# Patient Record
Sex: Male | Born: 1940
Health system: Southern US, Community
[De-identification: ages and names within clinical notes are randomized; demographics above are authoritative.]

## PROBLEM LIST (undated history)

## (undated) DIAGNOSIS — E039 Hypothyroidism, unspecified: Secondary | ICD-10-CM

## (undated) DIAGNOSIS — C801 Malignant (primary) neoplasm, unspecified: Secondary | ICD-10-CM

## (undated) DIAGNOSIS — G373 Acute transverse myelitis in demyelinating disease of central nervous system: Secondary | ICD-10-CM

## (undated) DIAGNOSIS — Z86010 Personal history of colonic polyps: Secondary | ICD-10-CM

## (undated) DIAGNOSIS — Z973 Presence of spectacles and contact lenses: Secondary | ICD-10-CM

## (undated) DIAGNOSIS — M2550 Pain in unspecified joint: Secondary | ICD-10-CM

## (undated) DIAGNOSIS — E785 Hyperlipidemia, unspecified: Secondary | ICD-10-CM

## (undated) DIAGNOSIS — M1990 Unspecified osteoarthritis, unspecified site: Secondary | ICD-10-CM

## (undated) HISTORY — DX: Hyperlipidemia, unspecified: E78.5

## (undated) HISTORY — DX: Presence of spectacles and contact lenses: Z97.3

## (undated) HISTORY — DX: Unspecified osteoarthritis, unspecified site: M19.90

## (undated) HISTORY — DX: Pain in unspecified joint: M25.50

## (undated) HISTORY — DX: Acute transverse myelitis in demyelinating disease of central nervous system: G37.3

## (undated) HISTORY — DX: Hypothyroidism, unspecified: E03.9

## (undated) HISTORY — DX: Personal history of colonic polyps: Z86.010

## (undated) HISTORY — PX: COLONOSCOPY: SHX174

## (undated) HISTORY — DX: Malignant (primary) neoplasm, unspecified: C80.1

## (undated) HISTORY — PX: MELANOMA EXCISION: SHX5266

---

## 2009-03-08 ENCOUNTER — Encounter: Admission: RE | Admit: 2009-03-08 | Discharge: 2009-03-08 | Payer: Self-pay | Admitting: Endocrinology

## 2010-12-13 HISTORY — PX: HERNIA REPAIR: SHX51

## 2011-03-11 ENCOUNTER — Other Ambulatory Visit: Payer: Self-pay | Admitting: Internal Medicine

## 2011-03-11 DIAGNOSIS — Z136 Encounter for screening for cardiovascular disorders: Secondary | ICD-10-CM

## 2011-03-15 ENCOUNTER — Ambulatory Visit
Admission: RE | Admit: 2011-03-15 | Discharge: 2011-03-15 | Disposition: A | Discharge status: Home / Self Care | Payer: BC Managed Care – PPO | Source: Ambulatory Visit | Attending: Internal Medicine | Admitting: Internal Medicine

## 2011-03-15 DIAGNOSIS — Z136 Encounter for screening for cardiovascular disorders: Secondary | ICD-10-CM

## 2011-04-19 ENCOUNTER — Other Ambulatory Visit (HOSPITAL_BASED_OUTPATIENT_CLINIC_OR_DEPARTMENT_OTHER): Payer: Self-pay | Admitting: Physician Assistant

## 2011-04-19 DIAGNOSIS — IMO0002 Reserved for concepts with insufficient information to code with codable children: Secondary | ICD-10-CM

## 2011-04-20 ENCOUNTER — Ambulatory Visit (HOSPITAL_BASED_OUTPATIENT_CLINIC_OR_DEPARTMENT_OTHER)
Admission: RE | Admit: 2011-04-20 | Discharge: 2011-04-20 | Disposition: A | Discharge status: Home / Self Care | Payer: BC Managed Care – PPO | Source: Ambulatory Visit | Attending: Physician Assistant | Admitting: Physician Assistant

## 2011-04-20 DIAGNOSIS — R109 Unspecified abdominal pain: Secondary | ICD-10-CM | POA: Insufficient documentation

## 2011-04-20 DIAGNOSIS — R1903 Right lower quadrant abdominal swelling, mass and lump: Secondary | ICD-10-CM

## 2011-06-14 ENCOUNTER — Encounter (INDEPENDENT_AMBULATORY_CARE_PROVIDER_SITE_OTHER): Payer: Self-pay | Admitting: Surgery

## 2011-06-14 ENCOUNTER — Ambulatory Visit (INDEPENDENT_AMBULATORY_CARE_PROVIDER_SITE_OTHER): Payer: BC Managed Care – PPO | Admitting: Surgery

## 2011-06-14 VITALS — BP 128/82 | HR 60 | Ht 67.0 in | Wt 150.2 lb

## 2011-06-14 DIAGNOSIS — K4020 Bilateral inguinal hernia, without obstruction or gangrene, not specified as recurrent: Secondary | ICD-10-CM

## 2011-06-14 NOTE — Progress Notes (Signed)
Dr.Gerkin  and asked me to see him because he has bilateral inguinal hernias. He is felt to be a good candidate for laparoscopic bilateral inguinal hernia repair with mesh. I have seen him and examined him and agreed. The procedure risks and after care have been discussed with him.

## 2011-06-14 NOTE — Progress Notes (Signed)
History: Patient is a 70 year old white male referred for evaluation of bilateral inguinal hernia. Patient first noted hernia approximately 2 months ago. Hernia on the right side has increased considerably in size. Patient has had no prior hernia surgery. He's had no prior abdominal surgery. He denies any signs or symptoms of intestinal obstruction.  Review of systems: No obstructive symptoms. Mild occasional discomfort consistent with "stinging". No prior hernia repairs.  Past Medical History  Diagnosis Date  . Hyperlipemia   . Hypothyroid   . Cancer     melanoma  . Wears glasses   . Joint pain     minor  . Hemorrhoids     occasional   No current outpatient prescriptions on file prior to visit.   No Known Allergies  Exam: HEENT shows him to be normocephalic. Sclerae are clear. Pupils equal and reactive. Dentition fair. Mucous membranes moist. Neck is soft and nontender without mass. No thyroid nodules. No lymphadenopathy. Chest is clear to auscultation without rales rhonchi or wheezes Cardiac exam shows regular rate and rhythm without murmur Extremities are nontender without edema Abdomen is soft nontender without distention. No sign of umbilical hernia. Genitourinary exam shows normal penis and testicles without mass or lesion. There are obvious bilateral inguinal hernias greater on the right than on the left. Both are reducible. Both augment with Valsalva and cough. Neurologic exam is intact. No focal deficits. No sign of tumor.  Impression: Bilateral inguinal herniae, reducible  Plan: Patient and I reviewed the anatomy with written literature. We discussed the options for repair. We discussed the use of mesh in a tension-free repair either by open technique or by laparoscopic technique. Given that the patient has not had prior surgery, and that he has bilateral inguinal hernias, I think he is an excellent candidate for laparoscopic repair.  I have asked my partner, Dr.  Abbey Chatters, to evaluate the patient today. He feels that the patient is a good candidate for laparoscopic surgery and he will be happy to perform that procedure in the near future.  I have explained the procedure, risks, and aftercare of inguinal hernia repair.  Risks include but are not limited to bleeding, infection, wound problems, anesthesia, recurrence, bladder or intestine injury, urinary retention, testicular dysfunction, chronic pain, mesh problems.  He seems to understand and agrees to proceed

## 2011-07-07 ENCOUNTER — Other Ambulatory Visit (INDEPENDENT_AMBULATORY_CARE_PROVIDER_SITE_OTHER): Payer: Self-pay | Admitting: General Surgery

## 2011-07-07 ENCOUNTER — Encounter (HOSPITAL_COMMUNITY)
Admission: RE | Admit: 2011-07-07 | Discharge: 2011-07-07 | Disposition: A | Discharge status: Home / Self Care | Payer: BC Managed Care – PPO | Source: Ambulatory Visit | Attending: General Surgery | Admitting: General Surgery

## 2011-07-07 ENCOUNTER — Ambulatory Visit (HOSPITAL_COMMUNITY)
Admission: RE | Admit: 2011-07-07 | Discharge: 2011-07-07 | Disposition: A | Discharge status: Home / Self Care | Payer: BC Managed Care – PPO | Source: Ambulatory Visit | Attending: General Surgery | Admitting: General Surgery

## 2011-07-07 DIAGNOSIS — Z01818 Encounter for other preprocedural examination: Secondary | ICD-10-CM | POA: Insufficient documentation

## 2011-07-07 DIAGNOSIS — K4020 Bilateral inguinal hernia, without obstruction or gangrene, not specified as recurrent: Secondary | ICD-10-CM

## 2011-07-07 DIAGNOSIS — Z01810 Encounter for preprocedural cardiovascular examination: Secondary | ICD-10-CM | POA: Insufficient documentation

## 2011-07-07 DIAGNOSIS — Z01812 Encounter for preprocedural laboratory examination: Secondary | ICD-10-CM | POA: Insufficient documentation

## 2011-07-07 LAB — CBC
HCT: 41.5 % (ref 39.0–52.0)
MCH: 31.1 pg (ref 26.0–34.0)
MCHC: 34.5 g/dL (ref 30.0–36.0)
MCV: 90.2 fL (ref 78.0–100.0)
RDW: 12.9 % (ref 11.5–15.5)
WBC: 8.2 10*3/uL (ref 4.0–10.5)

## 2011-07-07 LAB — COMPREHENSIVE METABOLIC PANEL
ALT: 16 U/L (ref 0–53)
Alkaline Phosphatase: 97 U/L (ref 39–117)
CO2: 30 mEq/L (ref 19–32)
Calcium: 9.2 mg/dL (ref 8.4–10.5)
GFR calc Af Amer: >60 mL/min (ref >60)
GFR calc non Af Amer: >60 mL/min (ref >60)
Glucose, Bld: 108 mg/dL — ABNORMAL HIGH (ref 70–99)
Sodium: 140 mEq/L (ref 135–145)

## 2011-07-07 LAB — DIFFERENTIAL
Eosinophils Relative: 3 % (ref 0–5)
Lymphocytes Relative: 10 % — ABNORMAL LOW (ref 12–46)
Lymphs Abs: 0.9 10*3/uL (ref 0.7–4.0)
Monocytes Absolute: 0.8 10*3/uL (ref 0.1–1.0)
Monocytes Relative: 10 % (ref 3–12)

## 2011-07-08 ENCOUNTER — Observation Stay: Admission: RE | Admit: 2011-07-08 | Payer: Self-pay | Source: Ambulatory Visit | Admitting: General Surgery

## 2011-07-08 ENCOUNTER — Observation Stay (HOSPITAL_COMMUNITY)
Admission: RE | Admit: 2011-07-08 | Discharge: 2011-07-09 | Disposition: A | Discharge status: Home / Self Care | Payer: BC Managed Care – PPO | Source: Ambulatory Visit | Attending: General Surgery | Admitting: General Surgery

## 2011-07-08 DIAGNOSIS — K4020 Bilateral inguinal hernia, without obstruction or gangrene, not specified as recurrent: Principal | ICD-10-CM | POA: Insufficient documentation

## 2011-07-08 DIAGNOSIS — Z01812 Encounter for preprocedural laboratory examination: Secondary | ICD-10-CM | POA: Insufficient documentation

## 2011-07-08 DIAGNOSIS — Z01818 Encounter for other preprocedural examination: Secondary | ICD-10-CM | POA: Insufficient documentation

## 2011-07-08 DIAGNOSIS — Z01810 Encounter for preprocedural cardiovascular examination: Secondary | ICD-10-CM | POA: Insufficient documentation

## 2011-07-12 NOTE — Op Note (Signed)
NAMEKORE, MADLOCK NO.:  [id number]  MEDICAL RECORD NO.:  [id number]  LOCATION:  5152                         FACILITY:  [facility]  PHYSICIAN:  Adolph Pollack, M.D.DATE OF BIRTH:  Jul 10, 1941  DATE OF PROCEDURE:  07/08/2011 DATE OF DISCHARGE:                              OPERATIVE REPORT   PREOPERATIVE DIAGNOSIS:  Bilateral inguinal hernias.  POSTOPERATIVE DIAGNOSIS:  Bilateral inguinal hernias.  PROCEDURE:  Laparoscopic repair of bilateral inguinal hernias with mesh.  SURGEON:  Adolph Pollack, MD  ANESTHESIA:  General.  INDICATION:  This is a 70 year old man who was fairly active.  He has noted some distinct discomfort in the groin area and has a bulge on the right side.  It has been increasing in size and also has an obvious hernia on the left side.  He now presents for laparoscopic repair of bilateral inguinal hernias.  The procedure risks and aftercare were discussed with him preoperatively.  TECHNIQUE:  He was seen in the holding area, then voided.  He was brought to the operating room, placed supine on the operating table, and general anesthetic was administered.  The hair in the lower abdominal wall and groin was clipped and the area sterilely prepped and draped.  Marcaine solution was infiltrated in the subcutaneous tissues and the subumbilical region.  A small transverse subumbilical incision was made through the skin and subcutaneous tissue.  The right anterior rectus sheath was identified with blunt dissection.  A small incision was made in the right anterior rectus sheath.  The underlying right rectus muscle was swept laterally, exposing the posterior rectus sheath.  A balloon dissection device was then placed in the extraperitoneal space and under laparoscopic vision, balloon dissection was performed of the extraperitoneal space in the lower midline.  The balloon was then removed and CO2 gas was insufflated, creating a working area.   Under direct vision, I placed two 5-mm trocars in the lower midline.  Using blunt dissection, the symphysis pubis was identified and Cooper ligament was identified using blunt dissection bilaterally.  Beginning on the right side, I dissected fibrofatty tissue free from the anterior and lateral abdominal walls back to the level of the umbilicus.  The direct space appeared solid.  There appeared to be indirect hernia sac present.  I then went to the left side and began my dissection here.  He appeared to have actually direct hernia on this side and I reduced extraperitoneal fat from the direct hernia defect.  I then dissected the fibrofatty tissue free from the anterior and lateral abdominal walls back to the level of the umbilicus.  He did sustain a pneumoperitoneum, but I saw no obvious defect in the peritoneum.  A Veress needle was placed through the subumbilical incision with evacuation of some of the pneumoperitoneum.  On the left side, I isolated spermatic cord and created a window around it and no indirect defect was noted.  I then went back to the right side and dissected the hernia sac free from the spermatic cord, exposing a patulous internal ring consistent with an indirect hernia.  I dissected the hernia sac back to about the level of the umbilicus.  I  then brought 2 pieces of Parietex TECR 15-cm x 15-cm mesh into the field.  Approximately 2 cm was cut off each piece of mesh.  A partial longitudinal slit was then cut into each piece of mesh.  I placed the first piece of mesh into the right extraperitoneal space and positioned it to the 2 tails that were wrapped around the spermatic cord.  We then anchored to [facility] ligament the anterior and lateral abdominal walls with absorbable tacks.  This provided for good coverage with adequate overlap of the direct, indirect, and femoral spaces.  I placed a second piece of mesh in the left extraperitoneal space, positioned it so that 2  tails wrapped around the spermatic cord.  I then anchored it to the Vermilion ligament as well as the anterior and lateral abdominal walls with absorbable tacks.  This provided for good coverage with good overlap of the direct, indirect, and femoral spaces.  There was some bleeding where one of the absorbable tacks was placed and I put the tack distal and proximal to this and controlled it.  I waited 5-10 minutes, inspected this area again, it was hemostatic.  Following this, I made sure the mesh was in adequate position and I released the CO2 gas and watched the extraperitoneal contents, approximated the mesh.  The Veress needle and the trocars were removed.  The right anterior rectus sheath defect was then closed with interrupted 0 Vicryl sutures.  The skin incisions were closed with 4-0 Monocryl subcuticular stitches.  Steri-Strips and sterile dressings were applied.  He tolerated the procedure well without any apparent complications and was taken to recovery room in satisfactory condition.     Adolph Pollack, M.D.     Kari Baars  D:  07/08/2011  T:  07/09/2011  Job:  [id number]  Electronically Signed by Avel Peace M.D. on 07/12/2011 09:37:26 AM

## 2011-08-02 ENCOUNTER — Encounter (INDEPENDENT_AMBULATORY_CARE_PROVIDER_SITE_OTHER): Payer: Self-pay | Admitting: General Surgery

## 2011-08-02 ENCOUNTER — Ambulatory Visit (INDEPENDENT_AMBULATORY_CARE_PROVIDER_SITE_OTHER): Payer: BC Managed Care – PPO | Admitting: General Surgery

## 2011-08-02 DIAGNOSIS — K4020 Bilateral inguinal hernia, without obstruction or gangrene, not specified as recurrent: Secondary | ICD-10-CM

## 2011-08-02 NOTE — Patient Instructions (Signed)
May chip and putt.  May resume usual activities on September 7.

## 2011-08-02 NOTE — Progress Notes (Signed)
Operation: Laparoscopic bilateral inguinal hernia repair  Date: July 08, 2011  Pathology: na  HPI:  He is here for his first postoperative visit. He has minimal soreness. He is using the bathroom well.   Physical Exam: Abdomen-incisions are well-healed  GU-mild right inguinal swelling consistent with seroma; left inguinal floor solid   Assessment:  Doing well following laparoscopic bilateral inguinal hernia repair.  Plan:  Continue light activities. Resume normal activities September 7. I discussed the possibility of having chronic intermittent pain. Return visit p.r.n.

## 2011-08-05 NOTE — Discharge Summary (Signed)
  NAMEKOBEY, SIDES NO.:  [id number]  MEDICAL RECORD NO.:  [id number]  LOCATION:  5152                         FACILITY:  [facility]  PHYSICIAN:  Adolph Pollack, M.D.DATE OF BIRTH:  [birth year]  DATE OF ADMISSION:  07/08/2011 DATE OF DISCHARGE:  07/09/2011                              DISCHARGE SUMMARY   FINAL DIAGNOSIS:  Bilateral inguinal hernia.  SECONDARY DIAGNOSIS:  Postoperative urinary retention.  PROCEDURE:  Laparoscopic repair, bilateral inguinal hernias with mesh July 08, 2011.  REASON FOR ADMISSION:  This is a 70 year old very active gentleman who has bilateral inguinal hernias.  Right side greater than left side.  He presented for the above procedure.  HOSPITAL COURSE:  He underwent laparoscopic repair, bilateral inguinal hernias with mesh.  Initially, we planned to send home as an outpatient but he had difficulty with urinary attention.  End up having to have a Foley catheter placed and thus was kept overnight.  He was fairly comfortable in the morning, able to ambulate.  He had been started on Flomax the night before.  After discussion with him it was decided the best thing to do would be to send him home with a Foley catheter and give him instructions how to care for it and how to remove in 2-3 days, and he and his wife were in agreement with this.  DISPOSITION:  Discharged to home with Foley catheter placed a leg bag. He will remove this in approximately 2-3 days.  He was given other discharge instructions with respect activity restrictions and dietary restrictions as well as pain medicine.  He was told to continue his aspirin, Synthroid and Lipitor.  He will follow up in the office in approximately 3-4 weeks.  He was in satisfactory condition upon discharge.     Adolph Pollack, M.D.     Kari Baars  D:  08/03/2011  T:  08/03/2011  Job:  [id number]  Electronically Signed by Avel Peace M.D. on 08/05/2011 09:52:30 AM

## 2014-04-11 ENCOUNTER — Other Ambulatory Visit: Payer: Self-pay | Admitting: Internal Medicine

## 2014-04-11 DIAGNOSIS — Z139 Encounter for screening, unspecified: Secondary | ICD-10-CM

## 2014-04-16 ENCOUNTER — Encounter (INDEPENDENT_AMBULATORY_CARE_PROVIDER_SITE_OTHER): Payer: Self-pay

## 2014-04-16 ENCOUNTER — Ambulatory Visit
Admission: RE | Admit: 2014-04-16 | Discharge: 2014-04-16 | Disposition: A | Discharge status: Home / Self Care | Payer: Commercial Managed Care - PPO | Source: Ambulatory Visit | Attending: Internal Medicine | Admitting: Internal Medicine

## 2014-04-16 DIAGNOSIS — Z139 Encounter for screening, unspecified: Secondary | ICD-10-CM

## 2014-05-14 ENCOUNTER — Ambulatory Visit (INDEPENDENT_AMBULATORY_CARE_PROVIDER_SITE_OTHER): Payer: Commercial Managed Care - PPO | Admitting: Cardiovascular Disease

## 2014-05-14 ENCOUNTER — Encounter: Payer: Self-pay | Admitting: Cardiovascular Disease

## 2014-05-14 VITALS — BP 130/88 | HR 59 | Ht 67.5 in | Wt 168.8 lb

## 2014-05-14 DIAGNOSIS — E785 Hyperlipidemia, unspecified: Secondary | ICD-10-CM

## 2014-05-14 NOTE — Patient Instructions (Signed)
Dr [name] wants you to follow-up in 1 year. You will receive a reminder letter in the mail one months in advance. If you don't receive a letter, please call our office to schedule the follow-up appointment.

## 2014-05-14 NOTE — Progress Notes (Signed)
     05/14/2014 Alec Snyder   1941-03-23  [id number]  Primary Physician Horatio Pel, MD Primary Cardiologist: Lorretta Harp MD Renae Gloss   HPI:  Mr. Alec Snyder is a delightful 73 year old married Caucasian male father of 2 children, and father of 4 grandchildren referred through the courtesy of Dr. Deland Pretty for cardiovascular evaluation. 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 His cardiac risk factor profile is only remarkable for mild medically treated hyperlipidemia. He did does not smoke nor is he diabetic. There is no family history. He has never had a heart or stroke. He denies chest pain or shortness of breath. He does exercise fairly frequently on the treadmill.   Current Outpatient Prescriptions  Medication Sig Dispense Refill  . aspirin 81 MG tablet Take 81 mg by mouth daily.        Marland Kitchen atorvastatin (LIPITOR) 10 MG tablet Take 10 mg by mouth daily.        Marland Kitchen levothyroxine (SYNTHROID, LEVOTHROID) 125 MCG tablet Take 125 mcg by mouth daily.         No current facility-administered medications for this visit.    No Known Allergies  History   Social History  . Marital Status: Married    Spouse Name: N/A    Number of Children: N/A  . Years of Education: N/A   Occupational History  . Not on file.   Social History Main Topics  . Smoking status: Former Smoker -- 1.50 packs/day for 3 years    Types: Cigarettes    Quit date: 05/14/1974  . Smokeless tobacco: Never Used  . Alcohol Use: 1.0 oz/week    2 drink(s) per week  . Drug Use: No  . Sexual Activity: Not on file   Other Topics Concern  . Not on file   Social History Narrative  . No narrative on file     Review of Systems: General: negative for chills, fever, night sweats or weight changes.  Cardiovascular: negative for chest pain, dyspnea on exertion, edema, orthopnea, palpitations, paroxysmal nocturnal dyspnea or shortness of  breath Dermatological: negative for rash Respiratory: negative for cough or wheezing Urologic: negative for hematuria Abdominal: negative for nausea, vomiting, diarrhea, bright red blood per rectum, melena, or hematemesis Neurologic: negative for visual changes, syncope, or dizziness All other systems reviewed and are otherwise negative except as noted above.    Blood pressure 130/88, pulse 59, height 5' 7.5" (1.715 m), weight 168 lb 12.8 oz (76.567 kg).  General appearance: alert and no distress Neck: no adenopathy, no carotid bruit, no JVD, supple, symmetrical, trachea midline and thyroid not enlarged, symmetric, no tenderness/mass/nodules Lungs: clear to auscultation bilaterally Heart: regular rate and rhythm, S1, S2 normal, no murmur, click, rub or gallop Extremities: extremities normal, atraumatic, no cyanosis or edema and plus pedal pulses bilaterally  EKG sinus bradycardia at 59 without ST or T wave changes  ASSESSMENT AND PLAN:   No problem-specific assessment & plan notes found for this encounter.      Lorretta Harp MD FACP,FACC,FAHA, [facility] 05/14/2014 4:20 PM

## 2014-12-13 HISTORY — PX: BLEPHAROPLASTY: SUR158

## 2016-02-20 ENCOUNTER — Ambulatory Visit (HOSPITAL_BASED_OUTPATIENT_CLINIC_OR_DEPARTMENT_OTHER)
Admission: RE | Admit: 2016-02-20 | Discharge: 2016-02-20 | Disposition: A | Discharge status: Home / Self Care | Payer: Commercial Managed Care - PPO | Source: Ambulatory Visit | Attending: General Practice | Admitting: General Practice

## 2016-02-20 ENCOUNTER — Other Ambulatory Visit (HOSPITAL_BASED_OUTPATIENT_CLINIC_OR_DEPARTMENT_OTHER): Payer: Self-pay | Admitting: General Practice

## 2016-02-20 DIAGNOSIS — R05 Cough: Secondary | ICD-10-CM

## 2016-02-20 DIAGNOSIS — R053 Chronic cough: Secondary | ICD-10-CM

## 2016-05-28 ENCOUNTER — Other Ambulatory Visit: Payer: Self-pay | Admitting: Internal Medicine

## 2016-05-28 DIAGNOSIS — M5412 Radiculopathy, cervical region: Secondary | ICD-10-CM

## 2016-06-01 ENCOUNTER — Other Ambulatory Visit: Payer: Self-pay | Admitting: Internal Medicine

## 2016-06-01 ENCOUNTER — Ambulatory Visit
Admission: RE | Admit: 2016-06-01 | Discharge: 2016-06-01 | Disposition: A | Discharge status: Home / Self Care | Payer: Commercial Managed Care - PPO | Source: Ambulatory Visit | Attending: Internal Medicine | Admitting: Internal Medicine

## 2016-06-01 DIAGNOSIS — M5412 Radiculopathy, cervical region: Secondary | ICD-10-CM

## 2016-06-01 MED ORDER — GADOBENATE DIMEGLUMINE 529 MG/ML IV SOLN
16.0000 mL | Freq: Once | INTRAVENOUS | Status: AC | PRN
  Administered 2016-06-01: 16 mL via INTRAVENOUS

## 2016-06-02 ENCOUNTER — Other Ambulatory Visit: Payer: Self-pay | Admitting: Internal Medicine

## 2016-06-02 ENCOUNTER — Ambulatory Visit (INDEPENDENT_AMBULATORY_CARE_PROVIDER_SITE_OTHER): Payer: Commercial Managed Care - PPO | Admitting: Diagnostic Neuroimaging

## 2016-06-02 ENCOUNTER — Encounter: Payer: Self-pay | Admitting: *Deleted

## 2016-06-02 VITALS — BP 183/94 | HR 66 | Ht 67.0 in | Wt 170.8 lb

## 2016-06-02 DIAGNOSIS — G373 Acute transverse myelitis in demyelinating disease of central nervous system: Secondary | ICD-10-CM | POA: Diagnosis not present

## 2016-06-02 DIAGNOSIS — M546 Pain in thoracic spine: Secondary | ICD-10-CM

## 2016-06-02 MED ORDER — PREDNISONE 10 MG PO TABS
ORAL_TABLET | ORAL | Status: DC
Start: 2016-06-02 — End: 2016-06-21

## 2016-06-02 NOTE — Patient Instructions (Signed)
Thank you for coming to see [place] at [facility]. I hope we have been able to provide you high quality care today.  You may receive a patient satisfaction survey over the next few weeks. We would appreciate your feedback and comments so that we may continue to improve ourselves and the health of our patients.  - start prednisone 69m, then reduce 11meach day.  - I will check MRI brain    ~~~~~~~~~~~~~~~~~~~~~~~~~~~~~~~~~~~~~~~~~~~~~~~~~~~~~~~~~~~~~~~~~  DR. Demarrion Meiklejohn'S GUIDE TO HAPPY AND HEALTHY LIVING These are some of my general health and wellness recommendations. Some of them may apply to you better than others. Please use common sense as you try these suggestions and feel free to ask me any questions.   ACTIVITY/FITNESS Mental, social, emotional and physical stimulation are very important for brain and body health. Try learning a new activity (arts, music, language, sports, games).  Keep moving your body to the best of your abilities. You can do this at home, inside or outside, the park, community center, gym or anywhere you like. Consider a physical therapist or personal trainer to get started. Consider the app Sworkit. Fitness trackers such as smart-watches, smart-phones or Fitbits can help as well.   NUTRITION Eat more plants: colorful vegetables, nuts, seeds and berries.  Eat less sugar, salt, preservatives and processed foods.  Avoid toxins such as cigarettes and alcohol.  Drink water when you are thirsty. Warm water with a slice of lemon is an excellent morning drink to start the day.  Consider these websites for more information The Nutrition Source (hthttps://www.henry-hernandez.biz/Precision Nutrition (wwWindowBlog.ch  RELAXATION Consider practicing mindfulness meditation or other relaxation techniques such as deep breathing, prayer, yoga, tai chi, massage. See website mindful.org or the apps Headspace or  Calm to help get started.   SLEEP Try to get at least 7-8+ hours sleep per day. Regular exercise and reduced caffeine will help you sleep better. Practice good sleep hygeine techniques. See website sleep.org for more information.   PLANNING Prepare estate planning, living will, healthcare POA documents. Sometimes this is best planned with the help of an attorney. Theconversationproject.org and agingwithdignity.org are excellent resources.

## 2016-06-02 NOTE — Progress Notes (Addendum)
[facility]  PATIENT: Alec Snyder DOB: Dec 10, 1941  REFERRING CLINICIAN: [name] HISTORY FROM: patient  REASON FOR VISIT: new consult    HISTORICAL  CHIEF COMPLAINT:  Chief Complaint  Patient presents with  . Pain    rm 7, New Pt ,"Abnl MRI C spine; pain/numbness/burning - L shoulder, chest, axilla > 1 month"    HISTORY OF PRESENT ILLNESS:   75 year old right-handed male here for evaluation of left upper extremity abnormal sensation, left axillary abnormal sensation, and abnormal MRI cervical spine.  March 2017, patient noted abnormal neck sensation, when looking to the left and taking a golf swing. Around April 2017 patient noticed new onset of hurting and burning sensation in his left armpit/axillary region and into his left chest. Patient initially thought he had rubbed onto poison ivy or possibly was developing shingles. Symptoms transitioned over the next few days into a numbness and tingling sensation. Patient also felt some heaviness in his left arm. Patient was still able to perform his day-to-day activities including playing golf.  Patient went to his work wellness clinic ([name]) for evaluation of of symptoms in May 2017, had cervical spine x-ray which showed advanced cervical spondylosis. Due to persistent symptoms patient followed up with PCP ([name]) in June 2017 for annual physical and then MRI of the cervical spine was ordered. On 06/01/2016, abnormal signal was noted within the spinal cord centrally at C6 and towards the left posterior lateral column extending inferiorly into the thoracic spinal cord. Follow-up postcontrast imaging demonstrated abnormal enhancement in the left posterior column. Patient then referred to me for urgent evaluation.  Today patient reports continued abnormal sensation in the left armpit, left chest, left posterior scapular region. Overall symptoms have been stable without worsening.   Patient denies any prodromal  infections, vaccinations, traumas or change in medications. No problems with right arm, bilateral legs, balance or walking. No headaches or vision changes.   REVIEW OF SYSTEMS: Full 14 system review of systems performed and negative with exception of: aching muscles.  ALLERGIES: No Known Allergies  HOME MEDICATIONS: Outpatient Prescriptions Prior to Visit  Medication Sig Dispense Refill  . aspirin 81 MG tablet Take 81 mg by mouth daily.      Marland Kitchen levothyroxine (SYNTHROID, LEVOTHROID) 125 MCG tablet Take 125 mcg by mouth daily.      Marland Kitchen atorvastatin (LIPITOR) 10 MG tablet Take 10 mg by mouth daily.       No facility-administered medications prior to visit.    PAST MEDICAL HISTORY: Past Medical History  Diagnosis Date  . Hyperlipemia   . Hypothyroid   . Cancer ([place])     melanoma  . Wears glasses   . Joint pain     minor  . Hemorrhoids     occasional  . Inguinal hernia     bilateral    PAST SURGICAL HISTORY: Past Surgical History  Procedure Laterality Date  . Melanoma excision    . Hernia repair  2012    BIH  . Blepharoplasty Bilateral 2016    FAMILY HISTORY: Family History  Problem Relation Age of Onset  . Kidney failure Father   . Diabetes Mother   . Arthritis Brother   . Arthritis Sister     SOCIAL HISTORY:  Social History   Social History  . Marital Status: Married    Spouse Name: [name]  . Number of Children: 2  . Years of Education: 16   Occupational History  .  retired, Airline pilot   Social History Main Topics  . Smoking status: Former Smoker -- 1.50 packs/day for 3 years    Types: Cigarettes    Quit date: 05/14/1974  . Smokeless tobacco: Never Used  . Alcohol Use: 1.2 oz/week    2 Standard drinks or equivalent per week     Comment: occas 2-4 beers  weekly  . Drug Use: No  . Sexual Activity: Not on file   Other Topics Concern  . Not on file   Social History Narrative   Lives with spouse   Caffeine use- coffee  3-4 cups daily      PHYSICAL EXAM  GENERAL EXAM/CONSTITUTIONAL: Vitals:  Filed Vitals:   06/02/16 1611  BP: 183/94  Pulse: 66  Height: 5\' 7"  (1.702 m)  Weight: 170 lb 12.8 oz (77.474 kg)     Body mass index is 26.74 kg/(m^2).  No exam data present  Patient is in no distress; well developed, nourished and groomed; neck is supple  CARDIOVASCULAR:  Examination of carotid arteries is normal; no carotid bruits  Regular rate and rhythm, no murmurs  Examination of peripheral vascular system by observation and palpation is normal  EYES:  Ophthalmoscopic exam of optic discs and posterior segments is normal; no papilledema or hemorrhages  MUSCULOSKELETAL:  Gait, strength, tone, movements noted in Neurologic exam below  NEUROLOGIC: MENTAL STATUS:  No flowsheet data found.  awake, alert, oriented to person, place and time  recent and remote memory intact  normal attention and concentration  language fluent, comprehension intact, naming intact,   fund of knowledge appropriate  CRANIAL NERVE:   2nd - no papilledema on fundoscopic exam  2nd, 3rd, 4th, 6th - pupils equal and reactive to light, visual fields full to confrontation, extraocular muscles intact, no nystagmus  5th - facial sensation symmetric  7th - facial strength symmetric  8th - hearing intact  9th - palate elevates symmetrically, uvula midline  11th - shoulder shrug symmetric  12th - tongue protrusion midline  MOTOR:   normal bulk and tone, full strength in the BUE, BLE  SENSORY:   normal and symmetric to light touch, pinprick, temperature, vibration; EXCEPT DECR PP IN LEFT AXILLA  COORDINATION:   finger-nose-finger, fine finger movements normal  REFLEXES:   deep tendon reflexes present and symmetric  GAIT/STATION:   narrow based gait; able to walk on toes, heels and tandem; romberg is negative    DIAGNOSTIC DATA (LABS, IMAGING, TESTING) - I reviewed patient records, labs, notes, testing  and imaging myself where available.  Lab Results  Component Value Date   WBC 8.2 07/07/2011   HGB 14.3 07/07/2011   HCT 41.5 07/07/2011   MCV 90.2 07/07/2011   PLT 243 07/07/2011      Component Value Date/Time   NA 140 07/07/2011 0956   K 4.5 07/07/2011 0956   CL 103 07/07/2011 0956   CO2 30 07/07/2011 0956   GLUCOSE 108* 07/07/2011 0956   BUN 11 07/07/2011 0956   CREATININE 0.96 07/07/2011 0956   CALCIUM 9.2 07/07/2011 0956   PROT 6.5 07/07/2011 0956   ALBUMIN 3.7 07/07/2011 0956   AST 19 07/07/2011 0956   ALT 16 07/07/2011 0956   ALKPHOS 97 07/07/2011 0956   BILITOT 0.5 07/07/2011 0956   GFRNONAA >60 07/07/2011 0956   GFRAA >60 07/07/2011 0956   No results found for: CHOL, HDL, LDLCALC, LDLDIRECT, TRIG, CHOLHDL No results found for: HGBA1C No results found for: VITAMINB12 No results found for: TSH  06/01/16 MRI cervical spine [I reviewed images myself and agree with interpretation. Other considerations would include spinal cord vascular malformation, such as dural AV fistula or other AVM. -VRP]  - Abnormal spinal cord beginning at C6 and extending into the upper thoracic spine. There is hyperintense signal in the central cord and in the posterior column on the left. The posterior column shows intense enhancement . Cord is not enlarged. - This may represent an area of healing transverse myelitis. Neoplasm appears unlikely given the multi segment extent of the abnormality and lack of cord enlargement. Cord infarct is a consideration. B12 deficiency typically is higher in the cervical cord and involves both posterior columns.     ASSESSMENT AND PLAN  75 y.o. year old male here with new onset burning, tingling, numbness sensation in left axillary region and left scapular region, with subjective muscle weakness and left upper extremity. Patient found to have unusual abnormal MRI cervical spine with longitudinally extensive spinal cord lesion centrally and within the left  posterior column region extending from C6 level down to visualized upper thoracic spinal cord region. Would favor inflammatory, autoimmune, vascular etiologies.   Ddx: spinal cord inflammatory, autoimmune, vascular, neoplastic etiologies  1. Transverse myelitis ([place])      PLAN: - I will order MRI brain with and without; we'll try to coordinate this with MRI thoracic spine which is already ordered - After additional MRI scans, we'll check additional lab testing and possible lumbar puncture - Start oral prednisone tapering course; considered IV Solu-Medrol but due to subacute timing of symptoms, patient concerns about side effects, mild neurologic exam symptoms, we decided to hold off on high-dose IV steroids  Orders Placed This Encounter  Procedures  . MR Brain W Wo Contrast    Meds ordered this encounter  Medications  . predniSONE (DELTASONE) 10 MG tablet    Sig: Take 60mg  on day 1. Reduce by 10mg  each subsequent day. (60, 50, 40, 30, 20, 10, stop)    Dispense:  21 tablet    Refill:  0   Return in about 1 month (around 07/02/2016).  I reviewed images, labs, notes, records myself. I summarized findings and reviewed with patient, for this high risk condition (possible transverse myelitis) requiring high complexity decision making.    Penni Bombard, MD [id number], [id number] PM Certified in Neurology, Neurophysiology and Neuroimaging  [facility] [address] [place], [place] [address] [phone]

## 2016-06-03 ENCOUNTER — Ambulatory Visit
Admission: RE | Admit: 2016-06-03 | Discharge: 2016-06-03 | Disposition: A | Discharge status: Home / Self Care | Payer: Commercial Managed Care - PPO | Source: Ambulatory Visit | Attending: Diagnostic Neuroimaging | Admitting: Diagnostic Neuroimaging

## 2016-06-03 ENCOUNTER — Ambulatory Visit
Admission: RE | Admit: 2016-06-03 | Discharge: 2016-06-03 | Disposition: A | Discharge status: Home / Self Care | Payer: Commercial Managed Care - PPO | Source: Ambulatory Visit | Attending: Internal Medicine | Admitting: Internal Medicine

## 2016-06-03 ENCOUNTER — Telehealth: Payer: Self-pay | Admitting: Diagnostic Neuroimaging

## 2016-06-03 DIAGNOSIS — G373 Acute transverse myelitis in demyelinating disease of central nervous system: Secondary | ICD-10-CM

## 2016-06-03 DIAGNOSIS — M546 Pain in thoracic spine: Secondary | ICD-10-CM

## 2016-06-03 MED ORDER — GADOBENATE DIMEGLUMINE 529 MG/ML IV SOLN
15.0000 mL | Freq: Once | INTRAVENOUS | Status: AC | PRN
  Administered 2016-06-03: 15 mL via INTRAVENOUS

## 2016-06-03 NOTE — Telephone Encounter (Signed)
[facility], they added the patients MRI Brain w/wo to a 6:30 apt right after his MRI Thoracic scheduled at 5:50 today. I called the patient to inform him.

## 2016-06-04 ENCOUNTER — Other Ambulatory Visit: Payer: Self-pay | Admitting: Diagnostic Neuroimaging

## 2016-06-04 ENCOUNTER — Other Ambulatory Visit (INDEPENDENT_AMBULATORY_CARE_PROVIDER_SITE_OTHER): Payer: Self-pay

## 2016-06-04 DIAGNOSIS — Z0289 Encounter for other administrative examinations: Secondary | ICD-10-CM

## 2016-06-21 ENCOUNTER — Encounter: Payer: Self-pay | Admitting: Diagnostic Neuroimaging

## 2016-06-21 ENCOUNTER — Ambulatory Visit (INDEPENDENT_AMBULATORY_CARE_PROVIDER_SITE_OTHER): Payer: Commercial Managed Care - PPO | Admitting: Diagnostic Neuroimaging

## 2016-06-21 VITALS — BP 138/86 | HR 68 | Wt 170.2 lb

## 2016-06-21 DIAGNOSIS — M542 Cervicalgia: Secondary | ICD-10-CM

## 2016-06-21 DIAGNOSIS — R200 Anesthesia of skin: Secondary | ICD-10-CM

## 2016-06-21 DIAGNOSIS — G373 Acute transverse myelitis in demyelinating disease of central nervous system: Secondary | ICD-10-CM

## 2016-06-21 MED ORDER — GABAPENTIN 300 MG PO CAPS: 300.0000 mg | ORAL_CAPSULE | Freq: Three times a day (TID) | ORAL | Status: DC

## 2016-06-21 NOTE — Patient Instructions (Signed)
-   I will check CT chest/abd/pelvis, additional lab testing and then lumbar puncture - trial of gabapentin 300mg  for symptoms management; take 1 tab at bedtime; may increase to twice a day or three times per day as tolerated

## 2016-06-21 NOTE — Progress Notes (Signed)
[facility]  PATIENT: Alec Snyder DOB: 02/20/1941  REFERRING CLINICIAN: [name] HISTORY FROM: patient  REASON FOR VISIT: follow up   HISTORICAL  CHIEF COMPLAINT:  Chief Complaint  Patient presents with  . Transverse myelitis    rm 7, early FU, "pain in neck > 3 mos-getting worse; cannot bend over; muscular type pain in chest; taking Aleve twice daily; numbness in upper chest the same, numbness in top left shoulder has decreased"  . Follow-up    early FU due to increasing pain in neck    HISTORY OF PRESENT ILLNESS:   UPDATE 06/21/16: Since last visit, overall having more neck pain. No change in sxs with prednisone pack. More chest stiffness. Balance, bowel, bladder function normal.  Left shoulder numbness has improved.  PRIOR HPI (06/02/16): 75 year old right-handed male here for evaluation of left upper extremity abnormal sensation, left axillary abnormal sensation, and abnormal MRI cervical spine. March 2017, patient noted abnormal neck sensation, when looking to the left and taking a golf swing. Around April 2017 patient noticed new onset of hurting and burning sensation in his left armpit/axillary region and into his left chest. Patient initially thought he had rubbed onto poison ivy or possibly was developing shingles. Symptoms transitioned over the next few days into a numbness and tingling sensation. Patient also felt some heaviness in his left arm. Patient was still able to perform his day-to-day activities including playing golf. Patient went to his work wellness clinic ([name]) for evaluation of of symptoms in May 2017, had cervical spine x-ray which showed advanced cervical spondylosis. Due to persistent symptoms patient followed up with PCP ([name]) in June 2017 for annual physical and then MRI of the cervical spine was ordered. On 06/01/2016, abnormal signal was noted within the spinal cord centrally at C6 and towards the left posterior lateral column  extending inferiorly into the thoracic spinal cord. Follow-up postcontrast imaging demonstrated abnormal enhancement in the left posterior column. Patient then referred to me for urgent evaluation. Today patient reports continued abnormal sensation in the left armpit, left chest, left posterior scapular region. Overall symptoms have been stable without worsening. Patient denies any prodromal infections, vaccinations, traumas or change in medications. No problems with right arm, bilateral legs, balance or walking. No headaches or vision changes.   REVIEW OF SYSTEMS: Full 14 system review of systems performed and negative with exception of: aching muscles.  ALLERGIES: No Known Allergies  HOME MEDICATIONS: Outpatient Prescriptions Prior to Visit  Medication Sig Dispense Refill  . aspirin 81 MG tablet Take 81 mg by mouth daily.      Marland Kitchen levothyroxine (SYNTHROID, LEVOTHROID) 125 MCG tablet Take 125 mcg by mouth daily.      . rosuvastatin (CRESTOR) 20 MG tablet Take 20 mg by mouth daily.    . predniSONE (DELTASONE) 10 MG tablet Take 60mg  on day 1. Reduce by 10mg  each subsequent day. (60, 50, 40, 30, 20, 10, stop) 21 tablet 0   No facility-administered medications prior to visit.    PAST MEDICAL HISTORY: Past Medical History  Diagnosis Date  . Hyperlipemia   . Hypothyroid   . Cancer ([place])     melanoma  . Wears glasses   . Joint pain     minor  . Hemorrhoids     occasional  . Inguinal hernia     bilateral    PAST SURGICAL HISTORY: Past Surgical History  Procedure Laterality Date  . Melanoma excision    . Hernia repair  2012    BIH  . Blepharoplasty Bilateral 2016    FAMILY HISTORY: Family History  Problem Relation Age of Onset  . Kidney failure Father   . Diabetes Mother   . Arthritis Brother   . Arthritis Sister     SOCIAL HISTORY:  Social History   Social History  . Marital Status: Married    Spouse Name: [name]  . Number of Children: 2  . Years of Education: 16     Occupational History  .      retired, Airline pilot   Social History Main Topics  . Smoking status: Former Smoker -- 1.50 packs/day for 3 years    Types: Cigarettes    Quit date: 05/14/1974  . Smokeless tobacco: Never Used  . Alcohol Use: 1.2 oz/week    2 Standard drinks or equivalent per week     Comment: occas 2-4 beers  weekly  . Drug Use: No  . Sexual Activity: Not on file   Other Topics Concern  . Not on file   Social History Narrative   Lives with spouse   Caffeine use- coffee  3-4 cups daily     PHYSICAL EXAM  GENERAL EXAM/CONSTITUTIONAL: Vitals:  Filed Vitals:   06/21/16 1054  BP: 138/86  Pulse: 68  Weight: 170 lb 3.2 oz (77.202 kg)   Body mass index is 26.65 kg/(m^2). No exam data present  Patient is in no distress; well developed, nourished and groomed; neck is supple  CARDIOVASCULAR:  Examination of carotid arteries is normal; no carotid bruits  Regular rate and rhythm, no murmurs  Examination of peripheral vascular system by observation and palpation is normal  EYES:  Ophthalmoscopic exam of optic discs and posterior segments is normal; no papilledema or hemorrhages  MUSCULOSKELETAL:  Gait, strength, tone, movements noted in Neurologic exam below  NEUROLOGIC: MENTAL STATUS:  No flowsheet data found.  awake, alert, oriented to person, place and time  recent and remote memory intact  normal attention and concentration  language fluent, comprehension intact, naming intact,   fund of knowledge appropriate  CRANIAL NERVE:   2nd - no papilledema on fundoscopic exam  2nd, 3rd, 4th, 6th - pupils equal and reactive to light, visual fields full to confrontation, extraocular muscles intact, no nystagmus  5th - facial sensation symmetric  7th - facial strength symmetric  8th - hearing intact  9th - palate elevates symmetrically, uvula midline  11th - shoulder shrug symmetric  12th - tongue protrusion midline  MOTOR:    normal bulk and tone, full strength in the BUE, BLE  SENSORY:   normal and symmetric to light touch, pinprick, temperature, vibration; EXCEPT DECR PP IN LEFT AXILLA  COORDINATION:   finger-nose-finger, fine finger movements normal  REFLEXES:   deep tendon reflexes present and symmetric  GAIT/STATION:   narrow based gait; able to walk on toes, heels and tandem; romberg is negative    DIAGNOSTIC DATA (LABS, IMAGING, TESTING) - I reviewed patient records, labs, notes, testing and imaging myself where available.  Lab Results  Component Value Date   WBC 8.2 07/07/2011   HGB 14.3 07/07/2011   HCT 41.5 07/07/2011   MCV 90.2 07/07/2011   PLT 243 07/07/2011      Component Value Date/Time   NA 140 07/07/2011 0956   K 4.5 07/07/2011 0956   CL 103 07/07/2011 0956   CO2 30 07/07/2011 0956   GLUCOSE 108* 07/07/2011 0956   BUN 11 07/07/2011 0956   CREATININE 0.96 07/07/2011  0956   CALCIUM 9.2 07/07/2011 0956   PROT 6.5 07/07/2011 0956   ALBUMIN 3.7 07/07/2011 0956   AST 19 07/07/2011 0956   ALT 16 07/07/2011 0956   ALKPHOS 97 07/07/2011 0956   BILITOT 0.5 07/07/2011 0956   GFRNONAA >60 07/07/2011 0956   GFRAA >60 07/07/2011 0956   No results found for: CHOL, HDL, LDLCALC, LDLDIRECT, TRIG, CHOLHDL No results found for: HGBA1C No results found for: VITAMINB12 No results found for: TSH   06/01/16 MRI cervical spine [I reviewed images myself and agree with interpretation. Other considerations would include spinal cord vascular malformation, such as dural AV fistula or other AVM. -VRP]  - Abnormal spinal cord beginning at C6 and extending into the upper thoracic spine. There is hyperintense signal in the central cord and in the posterior column on the left. The posterior column shows intense enhancement . Cord is not enlarged. - This may represent an area of healing transverse myelitis. Neoplasm appears unlikely given the multi segment extent of the abnormality and lack of  cord enlargement. Cord infarct is a consideration. B12 deficiency typically is higher in the cervical cord and involves both posterior columns.  06/03/16 MRI brain  - Normal for age MRI appearance of the brain.  06/03/16 MRI thoracic spine  - The bulk of the abnormal spinal cord lesion was imaged on the recent cervical exam. There is abnormal T2 signal within the cord from C5-C6 to T5-T6. There is abnormal dorsal spinal cord enhancement from C6-C7 to T3-T4. There appears to be a small syrinx at those levels associated with the abnormality. 3. The spinal cord below C6 is normal. No associated osseous or paraspinal abnormality. No associated spinal cord vasculature is evident. - Top differential considerations include: Primary spinal cord tumor, lymphoma or metastatic disease to the cord, and less likely an inflammatory process (such as sarcoidosis). Consider follow-up CT chest abdomen and pelvis (oral and IV contrast preferred) to evaluate for any non CNS primary tumor site. CSF analysis may also be valuable.    ASSESSMENT AND PLAN  75 y.o. year old male here with new onset burning, tingling, numbness sensation in left axillary region and left scapular region, with subjective muscle weakness and left upper extremity. Patient found to have unusual abnormal MRI cervical spine with longitudinally extensive spinal cord lesion centrally and within the left posterior column region extending from C6 level down to visualized upper thoracic spinal cord region. Would favor inflammatory, autoimmune, vascular or neoplastic etiologies.   Ddx: spinal cord inflammatory/autoimmune, vascular, neoplastic etiologies  1. Transverse myelitis ([place])   2. Numbness   3. Neck pain      PLAN: - check CT chest/abd/pelvis, additional lab testing and then lumbar puncture - trial of gabapentin 300mg  for symptoms mgmt  Orders Placed This Encounter  Procedures  . CT CHEST W CONTRAST  . CT ABDOMEN PELVIS W CONTRAST  . DG  FLUORO GUIDED LOC OF NEEDLE/CATH TIP FOR SPINAL INJECT LT  . CBC with Differential/Platelet  . Comprehensive metabolic panel  . Angiotensin converting enzyme  . ANA w/Reflex  . Pan-ANCA  . HEP, RPR, HIV Panel  . B12 and Folate Panel  . Hemoglobin A1C  . TSH   Meds ordered this encounter  Medications  . gabapentin (NEURONTIN) 300 MG capsule    Sig: Take 1 capsule (300 mg total) by mouth 3 (three) times daily.    Dispense:  90 capsule    Refill:  6   Return in about 2 weeks (  around 07/05/2016).    Penni Bombard, MD [id number], [id number] AM Certified in Neurology, Neurophysiology and Neuroimaging  [facility] [address], [place], [place] [address] [phone]

## 2016-06-22 ENCOUNTER — Telehealth: Payer: Self-pay | Admitting: Diagnostic Neuroimaging

## 2016-06-22 LAB — COMPREHENSIVE METABOLIC PANEL
A/G RATIO: 1.5 (ref 1.2–2.2)
ALT: 23 IU/L (ref 0–44)
AST: 19 IU/L (ref 0–40)
Albumin: 4.2 g/dL (ref 3.5–4.8)
Alkaline Phosphatase: 116 IU/L (ref 39–117)
BILIRUBIN TOTAL: 0.6 mg/dL (ref 0.0–1.2)
BUN/Creatinine Ratio: 16 (ref 10–24)
BUN: 15 mg/dL (ref 8–27)
CALCIUM: 9.7 mg/dL (ref 8.6–10.2)
CHLORIDE: 98 mmol/L (ref 96–106)
CO2: 24 mmol/L (ref 18–29)
Creatinine, Ser: 0.93 mg/dL (ref 0.76–1.27)
GFR, EST AFRICAN AMERICAN: 93 mL/min/{1.73_m2} (ref >59)
GFR, EST NON AFRICAN AMERICAN: 80 mL/min/{1.73_m2} (ref >59)
GLOBULIN, TOTAL: 2.8 g/dL (ref 1.5–4.5)
Glucose: 88 mg/dL (ref 65–99)
POTASSIUM: 4.4 mmol/L (ref 3.5–5.2)
SODIUM: 142 mmol/L (ref 134–144)
Total Protein: 7 g/dL (ref 6.0–8.5)

## 2016-06-22 LAB — HEMOGLOBIN A1C
ESTIMATED AVERAGE GLUCOSE: 128 mg/dL
Hgb A1c MFr Bld: 6.1 % — ABNORMAL HIGH (ref 4.8–5.6)

## 2016-06-22 LAB — CBC WITH DIFFERENTIAL/PLATELET
BASOS: 0 %
Basophils Absolute: 0 10*3/uL (ref 0.0–0.2)
EOS (ABSOLUTE): 0.2 10*3/uL (ref 0.0–0.4)
EOS: 2 %
HEMATOCRIT: 40.2 % (ref 37.5–51.0)
Hemoglobin: 13.4 g/dL (ref 12.6–17.7)
IMMATURE GRANULOCYTES: 0 %
Immature Grans (Abs): 0 10*3/uL (ref 0.0–0.1)
LYMPHS ABS: 1.6 10*3/uL (ref 0.7–3.1)
Lymphs: 17 %
MCH: 30.5 pg (ref 26.6–33.0)
MCHC: 33.3 g/dL (ref 31.5–35.7)
MCV: 91 fL (ref 79–97)
MONOS ABS: 0.7 10*3/uL (ref 0.1–0.9)
Monocytes: 7 %
NEUTROS ABS: 6.9 10*3/uL (ref 1.4–7.0)
Neutrophils: 74 %
PLATELETS: 333 10*3/uL (ref 150–379)
RBC: 4.4 x10E6/uL (ref 4.14–5.80)
RDW: 13.2 % (ref 12.3–15.4)
WBC: 9.4 10*3/uL (ref 3.4–10.8)

## 2016-06-22 LAB — TSH: TSH: 1.43 u[IU]/mL (ref 0.450–4.500)

## 2016-06-22 LAB — B12 AND FOLATE PANEL
FOLATE: 11.2 ng/mL (ref >3.0)
VITAMIN B 12: 217 pg/mL (ref 211–946)

## 2016-06-22 LAB — PAN-ANCA
ANCA Proteinase 3: <3.5 U/mL (ref 0.0–3.5)
Atypical pANCA: <1:20 {titer}
C-ANCA: <1:20 {titer}
P-ANCA: <1:20 {titer}

## 2016-06-22 LAB — HEP, RPR, HIV PANEL
HIV Screen 4th Generation wRfx: NONREACTIVE
Hepatitis B Surface Ag: NEGATIVE
RPR Ser Ql: NONREACTIVE

## 2016-06-22 LAB — ANA W/REFLEX: Anti Nuclear Antibody(ANA): NEGATIVE

## 2016-06-22 LAB — ANGIOTENSIN CONVERTING ENZYME: Angio Convert Enzyme: 18 U/L (ref 14–82)

## 2016-06-22 NOTE — Telephone Encounter (Signed)
Patient called, states he saw Dr. Leta Baptist yesterday and Dr. Leta Baptist ordered imaging for him. Would like to speak to someone to schedule.

## 2016-06-23 ENCOUNTER — Ambulatory Visit: Payer: Commercial Managed Care - PPO | Admitting: Diagnostic Neuroimaging

## 2016-06-23 ENCOUNTER — Telehealth: Payer: Self-pay | Admitting: *Deleted

## 2016-06-23 NOTE — Telephone Encounter (Signed)
Spoke with patient and informed him his lab results are unremarkable with the exception of elevated Hgb A1c. He stated "well I am prediabetic". Advised that his PCP probably keeps check on this; he stated "yes he does". Patient inquired if he would see these results on My Chart; advised he would after Dr Leta Baptist releases results. Patient verbalized understanding, appreciation for call.

## 2016-06-25 ENCOUNTER — Ambulatory Visit
Admission: RE | Admit: 2016-06-25 | Discharge: 2016-06-25 | Disposition: A | Discharge status: Home / Self Care | Payer: Commercial Managed Care - PPO | Source: Ambulatory Visit | Attending: Diagnostic Neuroimaging | Admitting: Diagnostic Neuroimaging

## 2016-06-25 DIAGNOSIS — G373 Acute transverse myelitis in demyelinating disease of central nervous system: Secondary | ICD-10-CM

## 2016-06-25 DIAGNOSIS — R200 Anesthesia of skin: Secondary | ICD-10-CM

## 2016-06-25 DIAGNOSIS — M542 Cervicalgia: Secondary | ICD-10-CM

## 2016-06-25 MED ORDER — IOPAMIDOL (ISOVUE-300) INJECTION 61%
100.0000 mL | Freq: Once | INTRAVENOUS | Status: AC | PRN
  Administered 2016-06-25: 100 mL via INTRAVENOUS

## 2016-06-25 NOTE — Telephone Encounter (Signed)
Patient has been scheduled at [facility].

## 2016-06-28 ENCOUNTER — Other Ambulatory Visit: Payer: Self-pay | Admitting: Diagnostic Neuroimaging

## 2016-06-28 ENCOUNTER — Ambulatory Visit
Admission: RE | Admit: 2016-06-28 | Discharge: 2016-06-28 | Disposition: A | Discharge status: Home / Self Care | Payer: Commercial Managed Care - PPO | Source: Ambulatory Visit | Attending: Diagnostic Neuroimaging | Admitting: Diagnostic Neuroimaging

## 2016-06-28 ENCOUNTER — Other Ambulatory Visit (HOSPITAL_COMMUNITY)
Admission: RE | Admit: 2016-06-28 | Discharge: 2016-06-28 | Disposition: A | Discharge status: Home / Self Care | Payer: Commercial Managed Care - PPO | Source: Ambulatory Visit | Attending: Diagnostic Neuroimaging | Admitting: Diagnostic Neuroimaging

## 2016-06-28 ENCOUNTER — Telehealth: Payer: Self-pay | Admitting: *Deleted

## 2016-06-28 ENCOUNTER — Telehealth: Payer: Self-pay | Admitting: Neurology

## 2016-06-28 DIAGNOSIS — G373 Acute transverse myelitis in demyelinating disease of central nervous system: Secondary | ICD-10-CM

## 2016-06-28 DIAGNOSIS — M542 Cervicalgia: Secondary | ICD-10-CM | POA: Insufficient documentation

## 2016-06-28 DIAGNOSIS — R200 Anesthesia of skin: Secondary | ICD-10-CM | POA: Insufficient documentation

## 2016-06-28 LAB — CSF CELL COUNT WITH DIFFERENTIAL
BASOPHILS, %: 0 %
Eosinophils, CSF: 0 %
Lymphs, CSF: 73 % (ref 40–80)
Monocyte/Macrophage: 27 % (ref 15–45)
RBC COUNT CSF: 18 {cells}/uL — AB (ref 0–10)
SEGMENTED NEUTROPHILS-CSF: 0 % (ref 0–6)
WBC, CSF: 11 cells/uL — ABNORMAL HIGH (ref 0–5)

## 2016-06-28 LAB — FUNGUS CULTURE W SMEAR

## 2016-06-28 LAB — GLUCOSE, CSF: GLUCOSE CSF: 70 mg/dL (ref 43–76)

## 2016-06-28 LAB — PROTEIN, CSF: Total Protein, CSF: 50 mg/dL (ref 15–60)

## 2016-06-28 NOTE — Discharge Instructions (Signed)

## 2016-06-28 NOTE — Telephone Encounter (Signed)
I received a message from [facility] labs: WBC = 11 (73%lymphs and 27% monocytes), elevated RBC = 18, elevated Glucose = 70 (normal) Prot = 50 (normal)  Elevation of WBC is mild so will await other labs

## 2016-06-28 NOTE — Progress Notes (Signed)
1 SST tube of blood drawn from left AC. Site is unremarkable and pt tolerated procedure well.  Discharge instructions explained to pt.

## 2016-06-28 NOTE — Telephone Encounter (Signed)
LVM requesting patient call back; left name, number.

## 2016-06-29 ENCOUNTER — Encounter: Payer: Self-pay | Admitting: Diagnostic Neuroimaging

## 2016-06-29 ENCOUNTER — Telehealth: Payer: Self-pay | Admitting: *Deleted

## 2016-06-29 NOTE — Telephone Encounter (Signed)
Received note from phone staff to call patient back on his cell phone. Spoke with patient and informed him that Dr Dohmeier sent result note re: Ct scan chest/abdomen/pelvis. Informed patient that it was negative for any tumors which was reason Dr Leta Baptist ordered scan.  Informed him that all the results from LP not back, and this RN requested work in Dr Rexene Alberts review and give result to this RN to call him back. Advised that this RN will call him back tomorrow, but that some results may still be pending. He requested to be called on his cell as he will be leaving town mid morning tomorrow. He verbalized understanding, appreciation for call.

## 2016-06-29 NOTE — Telephone Encounter (Signed)
I reviewed available lumbar puncture test results. So far, spinal fluid test results are benign, several test results are pending, culture is pending, protein level, glucose level were normal, white cell count slightly up from normal but white cell count breakdown does not indicate acute infection. We will continue to monitor for additional test results but so far everything looks benign.

## 2016-06-29 NOTE — Telephone Encounter (Signed)
LVM on patient's work phone at request of person who answered home phone.  Requested patient call back; left name, number.

## 2016-06-30 LAB — VDRL, CSF: SYPHILIS VDRL QUANT CSF: NONREACTIVE

## 2016-06-30 NOTE — Telephone Encounter (Signed)
Received call back from patient. Gave him Dr Guadelupe Sabin result note of all results that are currently available are normal, benign. Advised that he will get a call back when pending results are in and have been reviewed. He requested his cell # be called. He verbalized understanding, appreciation.

## 2016-06-30 NOTE — Telephone Encounter (Signed)
LVM requesting call back re: lab results. Left name, number. 

## 2016-06-30 NOTE — Telephone Encounter (Signed)
Available LP lab results called this morning: Received call back from patient. Gave him Dr Guadelupe Sabin result note of all results that are currently available are normal, benign. Advised that he will get a call back when pending results are in and have been reviewed. He requested his cell # be called. He verbalized understanding, appreciation.

## 2016-07-01 LAB — CSF CULTURE W GRAM STAIN
Gram Stain: NONE SEEN
Organism ID, Bacteria: NO GROWTH

## 2016-07-01 LAB — CSF CULTURE

## 2016-07-03 LAB — ANAEROBIC CULTURE: GRAM STAIN: NONE SEEN

## 2016-07-06 ENCOUNTER — Telehealth: Payer: Self-pay | Admitting: *Deleted

## 2016-07-06 ENCOUNTER — Encounter: Payer: Self-pay | Admitting: *Deleted

## 2016-07-06 LAB — OLIGOCLONAL BANDS, CSF + SERM

## 2016-07-06 NOTE — Telephone Encounter (Signed)
Per Dr Felecia Shelling, spoke with patient's wife and informed her the remainder of LP results are back and look good. There is no evidence of multiple sclerosis or other inflammation.  She verbalized understanding, appreciation

## 2016-07-08 ENCOUNTER — Encounter: Payer: Self-pay | Admitting: Diagnostic Neuroimaging

## 2016-07-08 ENCOUNTER — Ambulatory Visit (INDEPENDENT_AMBULATORY_CARE_PROVIDER_SITE_OTHER): Payer: Commercial Managed Care - PPO | Admitting: Diagnostic Neuroimaging

## 2016-07-08 ENCOUNTER — Telehealth: Payer: Self-pay | Admitting: Diagnostic Neuroimaging

## 2016-07-08 VITALS — BP 143/75 | HR 76 | Wt 171.4 lb

## 2016-07-08 DIAGNOSIS — R200 Anesthesia of skin: Secondary | ICD-10-CM

## 2016-07-08 DIAGNOSIS — G373 Acute transverse myelitis in demyelinating disease of central nervous system: Secondary | ICD-10-CM | POA: Diagnosis not present

## 2016-07-08 DIAGNOSIS — M542 Cervicalgia: Secondary | ICD-10-CM | POA: Diagnosis not present

## 2016-07-08 MED ORDER — CYCLOBENZAPRINE HCL 10 MG PO TABS: 10.0000 mg | ORAL_TABLET | Freq: Three times a day (TID) | ORAL | 3 refills | Status: DC | PRN

## 2016-07-08 MED ORDER — GABAPENTIN 300 MG PO CAPS: 300.0000 mg | ORAL_CAPSULE | Freq: Three times a day (TID) | ORAL | 6 refills | Status: DC

## 2016-07-08 NOTE — Telephone Encounter (Signed)
Called home phone, no answer, did not leave vm. Called mobile number and spoke with patient. He stated he is "having so much pain across his upper chest, between his shoulder blades, and in his upper right arm that he cannot get out of bed".  He stated the numbness in left chest has returned. He stated he cannot wait until his follow up tomorrow. He stated "I either need to be seen today or have a referral right away to be seen at [facility] or somewhere else".  Rescheduled his FU on tomorrow to today at 1 pm, requested he arrive 15 min early to check in. He verbalized understanding, appreciation.

## 2016-07-08 NOTE — Patient Instructions (Signed)
-   I will check MRI cervical and thoracic spine  - I will setup physical therapy  - increase gabapentin up to 600mg  three times per day  - use ibuprofen 400-800mg  twice a day as needed  - try cyclobenzaprine 10mg  every 8 hours as needed for muscle pain/spasm

## 2016-07-08 NOTE — Telephone Encounter (Signed)
Pt called in wanting to come in today due to increase pain. He would also like a referral.  [phone]

## 2016-07-08 NOTE — Progress Notes (Signed)
[facility]  PATIENT: Alec [place] DOB: Dec 16, 1940  REFERRING CLINICIAN: [name] HISTORY FROM: patient  REASON FOR VISIT: follow up   HISTORICAL  CHIEF COMPLAINT:  Chief Complaint  Patient presents with  . Other    rm 7, transverse myelitis, "pain in base of neck, upper right arm, upper and left side of back; took Ibuprofen 600 mg last night to get to sleep; pain is increasing-worse with movements"  . Follow-up    6 weeks, requested to be seen due to increasing pain    HISTORY OF PRESENT ILLNESS:   UPDATE 07/08/16: Since last visit, sxs are stable. Still with neck and shoulder pain. Now right arm is hurting more. Was able to play in golf tournament with son. Test results reviewed.  UPDATE 06/21/16: Since last visit, overall having more neck pain. No change in sxs with prednisone pack. More chest stiffness. Balance, bowel, bladder function normal.  Left shoulder numbness has improved.  PRIOR HPI (06/02/16): 75 year old right-handed male here for evaluation of left upper extremity abnormal sensation, left axillary abnormal sensation, and abnormal MRI cervical spine. March 2017, patient noted abnormal neck sensation, when looking to the left and taking a golf swing. Around April 2017 patient noticed new onset of hurting and burning sensation in his left armpit/axillary region and into his left chest. Patient initially thought he had rubbed onto poison ivy or possibly was developing shingles. Symptoms transitioned over the next few days into a numbness and tingling sensation. Patient also felt some heaviness in his left arm. Patient was still able to perform his day-to-day activities including playing golf. Patient went to his work wellness clinic ([name]) for evaluation of of symptoms in May 2017, had cervical spine x-ray which showed advanced cervical spondylosis. Due to persistent symptoms patient followed up with PCP ([name]) in June 2017 for annual physical and  then MRI of the cervical spine was ordered. On 06/01/2016, abnormal signal was noted within the spinal cord centrally at C6 and towards the left posterior lateral column extending inferiorly into the thoracic spinal cord. Follow-up postcontrast imaging demonstrated abnormal enhancement in the left posterior column. Patient then referred to me for urgent evaluation. Today patient reports continued abnormal sensation in the left armpit, left chest, left posterior scapular region. Overall symptoms have been stable without worsening. Patient denies any prodromal infections, vaccinations, traumas or change in medications. No problems with right arm, bilateral legs, balance or walking. No headaches or vision changes.   REVIEW OF SYSTEMS: Full 14 system review of systems performed and negative with exception of: aching muscles.  ALLERGIES: No Known Allergies  HOME MEDICATIONS: Outpatient Medications Prior to Visit  Medication Sig Dispense Refill  . aspirin 81 MG tablet Take 81 mg by mouth daily.      Marland Kitchen levothyroxine (SYNTHROID, LEVOTHROID) 125 MCG tablet Take 125 mcg by mouth daily.      . rosuvastatin (CRESTOR) 20 MG tablet Take 20 mg by mouth daily.    Marland Kitchen gabapentin (NEURONTIN) 300 MG capsule Take 1 capsule (300 mg total) by mouth 3 (three) times daily. 90 capsule 6   No facility-administered medications prior to visit.     PAST MEDICAL HISTORY: Past Medical History:  Diagnosis Date  . Cancer ([place])    melanoma  . Hemorrhoids    occasional  . Hyperlipemia   . Hypothyroid   . Inguinal hernia    bilateral  . Joint pain    minor  . Wears glasses  PAST SURGICAL HISTORY: Past Surgical History:  Procedure Laterality Date  . BLEPHAROPLASTY Bilateral 2016  . HERNIA REPAIR  2012   BIH  . MELANOMA EXCISION      FAMILY HISTORY: Family History  Problem Relation Age of Onset  . Kidney failure Father   . Diabetes Mother   . Arthritis Brother   . Arthritis Sister     SOCIAL  HISTORY:  Social History   Social History  . Marital status: Married    Spouse name: Alec [place]  . Number of children: 2  . Years of education: 16   Occupational History  .      retired, Airline pilot   Social History Main Topics  . Smoking status: Former Smoker    Packs/day: 1.50    Years: 3.00    Types: Cigarettes    Quit date: 05/14/1974  . Smokeless tobacco: Never Used  . Alcohol use 1.2 oz/week    2 Standard drinks or equivalent per week     Comment: occas 2-4 beers  weekly  . Drug use: No  . Sexual activity: Not on file   Other Topics Concern  . Not on file   Social History Narrative   Lives with spouse   Caffeine use- coffee  3-4 cups daily     PHYSICAL EXAM  GENERAL EXAM/CONSTITUTIONAL: Vitals:  Vitals:   07/08/16 1259  BP: (!) 143/75  Pulse: 76  Weight: 171 lb 6.4 oz (77.7 kg)   Body mass index is 26.85 kg/m. No exam data present  Patient is in no distress; well developed, nourished and groomed; neck is supple  CARDIOVASCULAR:  Examination of carotid arteries is normal; no carotid bruits  Regular rate and rhythm, no murmurs  Examination of peripheral vascular system by observation and palpation is normal  EYES:  Ophthalmoscopic exam of optic discs and posterior segments is normal; no papilledema or hemorrhages  MUSCULOSKELETAL:  Gait, strength, tone, movements noted in Neurologic exam below  NEUROLOGIC: MENTAL STATUS:  No flowsheet data found.  awake, alert, oriented to person, place and time  recent and remote memory intact  normal attention and concentration  language fluent, comprehension intact, naming intact,   fund of knowledge appropriate  CRANIAL NERVE:   2nd - no papilledema on fundoscopic exam  2nd, 3rd, 4th, 6th - pupils equal and reactive to light, visual fields full to confrontation, extraocular muscles intact, no nystagmus  5th - facial sensation symmetric  7th - facial strength symmetric  8th - hearing  intact  9th - palate elevates symmetrically, uvula midline  11th - shoulder shrug symmetric  12th - tongue protrusion midline  MOTOR:   normal bulk and tone, full strength in the BUE, BLE  SENSORY:   normal and symmetric to light touch, pinprick, temperature, vibration; EXCEPT DECR LT SENS IN LEFT AXILLA  COORDINATION:   finger-nose-finger, fine finger movements normal  REFLEXES:   deep tendon reflexes present and symmetric  GAIT/STATION:   narrow based gait; able to walk on toes, heels and tandem; romberg is negative    DIAGNOSTIC DATA (LABS, IMAGING, TESTING) - I reviewed patient records, labs, notes, testing and imaging myself where available.  Lab Results  Component Value Date   WBC 9.4 06/21/2016   HGB 14.3 07/07/2011   HCT 40.2 06/21/2016   MCV 91 06/21/2016   PLT 333 06/21/2016      Component Value Date/Time   NA 142 06/21/2016 1311   K 4.4 06/21/2016 1311   CL 98 06/21/2016  1311   CO2 24 06/21/2016 1311   GLUCOSE 88 06/21/2016 1311   GLUCOSE 108 (H) 07/07/2011 0956   BUN 15 06/21/2016 1311   CREATININE 0.93 06/21/2016 1311   CALCIUM 9.7 06/21/2016 1311   PROT 7.0 06/21/2016 1311   ALBUMIN 4.2 06/21/2016 1311   AST 19 06/21/2016 1311   ALT 23 06/21/2016 1311   ALKPHOS 116 06/21/2016 1311   BILITOT 0.6 06/21/2016 1311   GFRNONAA 80 06/21/2016 1311   GFRAA 93 06/21/2016 1311   No results found for: CHOL, HDL, LDLCALC, LDLDIRECT, TRIG, CHOLHDL Lab Results  Component Value Date   HGBA1C 6.1 (H) 06/21/2016   Lab Results  Component Value Date   VITAMINB12 217 06/21/2016   Lab Results  Component Value Date   TSH 1.430 06/21/2016    06/01/16 MRI cervical spine [I reviewed images myself and agree with interpretation. Other considerations would include spinal cord vascular malformation, such as dural AV fistula or other AVM. -VRP]  - Abnormal spinal cord beginning at C6 and extending into the upper thoracic spine. There is hyperintense signal in  the central cord and in the posterior column on the left. The posterior column shows intense enhancement . Cord is not enlarged. - This may represent an area of healing transverse myelitis. Neoplasm appears unlikely given the multi segment extent of the abnormality and lack of cord enlargement. Cord infarct is a consideration. B12 deficiency typically is higher in the cervical cord and involves both posterior columns.  06/03/16 MRI brain  - Normal for age MRI appearance of the brain.  06/03/16 MRI thoracic spine  - The bulk of the abnormal spinal cord lesion was imaged on the recent cervical exam. There is abnormal T2 signal within the cord from C5-C6 to T5-T6. There is abnormal dorsal spinal cord enhancement from C6-C7 to T3-T4. There appears to be a small syrinx at those levels associated with the abnormality. 3. The spinal cord below C6 is normal. No associated osseous or paraspinal abnormality. No associated spinal cord vasculature is evident. - Top differential considerations include: Primary spinal cord tumor, lymphoma or metastatic disease to the cord, and less likely an inflammatory process (such as sarcoidosis). Consider follow-up CT chest abdomen and pelvis (oral and IV contrast preferred) to evaluate for any non CNS primary tumor site. CSF analysis may also be valuable.  06/25/16 CT chest / abd / pelvis 1. No evidence of primary malignancy or metastatic disease in the chest, abdomen or pelvis. 2. Minimal colonic diverticulosis. 3. Dense coronary artery atheromatous calcifications. 4. Aortic atherosclerosis. 5. Degenerative right sacroiliitis.  06/28/16 CSF studies --> WBC 11, RBC 11, glucose 70, protein 50, VDRL non-reactive, cultures negative, cytology negative, OCB (2 well defined gamma restriction bands that are also present in the patient's corresponding serum sample, but some bands in the CSF are more prominent)     ASSESSMENT AND PLAN  75 y.o. year old male here with new onset  burning, tingling, numbness sensation in left axillary region and left scapular region, with subjective muscle weakness and left upper extremity. Patient found to have unusual abnormal MRI cervical spine with longitudinally extensive spinal cord lesion centrally and within the left posterior column region extending from C6 level down to visualized upper thoracic spinal cord region. Would favor inflammatory, autoimmune, vascular or neoplastic etiologies.   Ddx: spinal cord inflammatory/autoimmune, vascular, neoplastic etiologies  1. Transverse myelitis ([place])   2. Numbness   3. Neck pain      PLAN: - increase gabapentin  300-600mg  TID  - ibuprofen 400-800mg  BID prn - cyclobenzaprine 10mg  q8hour prn - repeat MRI cervical and thoracic spine  Orders Placed This Encounter  Procedures  . MR Cervical Spine W Wo Contrast  . MR Thoracic Spine W Wo Contrast  . Ambulatory referral to Physical Therapy   Meds ordered this encounter  Medications  . gabapentin (NEURONTIN) 300 MG capsule    Sig: Take 1-2 capsules (300-600 mg total) by mouth 3 (three) times daily.    Dispense:  180 capsule    Refill:  6  . cyclobenzaprine (FLEXERIL) 10 MG tablet    Sig: Take 1 tablet (10 mg total) by mouth 3 (three) times daily as needed for muscle spasms.    Dispense:  90 tablet    Refill:  3   Return in about 1 month (around 08/08/2016).    Penni Bombard, MD [id number], [id number] PM Certified in Neurology, Neurophysiology and Neuroimaging  [facility] [address] [place], [place] [address] [phone]

## 2016-07-09 ENCOUNTER — Ambulatory Visit: Payer: Commercial Managed Care - PPO | Admitting: Diagnostic Neuroimaging

## 2016-07-12 ENCOUNTER — Telehealth: Payer: Self-pay | Admitting: Diagnostic Neuroimaging

## 2016-07-12 NOTE — Telephone Encounter (Signed)
Patient is calling about an order for physical therapy.  Please call. Thanks!

## 2016-07-12 NOTE — Telephone Encounter (Signed)
Spoke with patient who stated he has not heard about PT services. Informed him that he may hear from them today; order was placed last Thursday. Gave him [facility] phone number and advised he call if he has not heard from them in a few days.  He questioned their location; informed him they are in the same building as Dr [employer] office. He verbalized understanding, appreciation.

## 2016-07-22 ENCOUNTER — Ambulatory Visit
Admission: RE | Admit: 2016-07-22 | Discharge: 2016-07-22 | Disposition: A | Discharge status: Home / Self Care | Payer: Commercial Managed Care - PPO | Source: Ambulatory Visit | Attending: Diagnostic Neuroimaging | Admitting: Diagnostic Neuroimaging

## 2016-07-22 DIAGNOSIS — R200 Anesthesia of skin: Secondary | ICD-10-CM

## 2016-07-22 DIAGNOSIS — G373 Acute transverse myelitis in demyelinating disease of central nervous system: Secondary | ICD-10-CM

## 2016-07-22 DIAGNOSIS — M542 Cervicalgia: Secondary | ICD-10-CM

## 2016-07-22 MED ORDER — GADOBENATE DIMEGLUMINE 529 MG/ML IV SOLN
15.0000 mL | Freq: Once | INTRAVENOUS | Status: AC | PRN
  Administered 2016-07-22: 15 mL via INTRAVENOUS

## 2016-07-26 ENCOUNTER — Telehealth: Payer: Self-pay | Admitting: Diagnostic Neuroimaging

## 2016-07-26 DIAGNOSIS — G373 Acute transverse myelitis in demyelinating disease of central nervous system: Secondary | ICD-10-CM

## 2016-07-26 NOTE — Telephone Encounter (Signed)
Patient called regarding MRI results, would like to talk to Dr. Leta Baptist about the results, doesn't understand a lot of the medical terminology. Please call.

## 2016-07-26 NOTE — Telephone Encounter (Signed)
I called patient with MRI results. MRI cervical and thoracic spine scans are stable. Still with significant pain and now with intermittent muscle weakness. Also with long term low back pain (right leg sciatica). Will try course of IV solumedrol 1000mg  daily x 3 days, then prednisone taper x 12 days.  Patient requesting second opinion at [facility]. Will setup.     Penni Bombard, MD [id number], [id number] PM Certified in Neurology, Neurophysiology and Neuroimaging  [facility] [address], [place], [place] [address] [phone]

## 2016-07-27 NOTE — Telephone Encounter (Signed)
Orders and information given to Otila Kluver, infusion RN.

## 2016-07-29 ENCOUNTER — Ambulatory Visit: Payer: Commercial Managed Care - PPO | Attending: Diagnostic Neuroimaging | Admitting: Rehabilitation

## 2016-07-29 ENCOUNTER — Encounter: Payer: Self-pay | Admitting: Rehabilitation

## 2016-07-29 DIAGNOSIS — M542 Cervicalgia: Secondary | ICD-10-CM | POA: Diagnosis present

## 2016-07-29 DIAGNOSIS — R293 Abnormal posture: Secondary | ICD-10-CM | POA: Diagnosis present

## 2016-07-29 NOTE — Therapy (Signed)
[facility] [address] [place], [place], [address] Phone: [phone]   Fax:  [phone]  Physical Therapy Evaluation  Patient Details  Name: Alec [place] MRN: [id number] Date of Birth: 08-Dec-1941 Referring Provider: Andrey Spearman, MD  Encounter Date: 07/29/2016      PT End of Session - 07/29/16 1212    Visit Number 1   Number of Visits 5  including eval   Date for PT Re-Evaluation 09/27/16  did 8 wk re-eval because going out of town for 3 weeks   Authorization Type [employer], [employer] PPO   PT Start Time [phone]   PT Stop Time 0930   PT Time Calculation (min) 43 min   Activity Tolerance Patient tolerated treatment well   Behavior During Therapy [facility] for tasks assessed/performed      Past Medical History:  Diagnosis Date  . Cancer ([place])    melanoma  . Hemorrhoids    occasional  . Hyperlipemia   . Hypothyroid   . Inguinal hernia    bilateral  . Joint pain    minor  . Wears glasses     Past Surgical History:  Procedure Laterality Date  . BLEPHAROPLASTY Bilateral 2016  . HERNIA REPAIR  2012   BIH  . MELANOMA EXCISION      There were no vitals filed for this visit.       Subjective Assessment - 07/29/16 0852    Subjective "I was having awful pain and I couldn't get out of bed, but I'm doing much better now."  "I still have numbness on my L side, but the pain is better."  "I did have some therapy because I had trouble at L5."     Limitations Walking   Patient Stated Goals "I want to get some exercises so that I can continue my "self therapy" and continue to exercise and be active."    Currently in Pain? Yes   Pain Score 2    Pain Location Neck   Pain Orientation Right;Left   Pain Descriptors / Indicators Aching   Pain Type Chronic pain   Pain Radiating Towards neck, to arms and upper back   Pain Onset More than a month ago   Pain Frequency Constant   Aggravating Factors  Turning neck, driving   Pain Relieving Factors not turning, ibuprofen            [facility] PT Assessment - 07/29/16 0001      Assessment   Medical Diagnosis Transverse Myelitis   Referring Provider Andrey Spearman, MD   Onset Date/Surgical Date 06/21/16  roughly when diagnosed     Restrictions   Weight Bearing Restrictions No     Balance Screen   Has the patient fallen in the past 6 months No     [place] residence   Living Arrangements Spouse/significant other   Available Help at Discharge Family;Available 24 hours/day   Type of Home House   Home Access Stairs to enter   Entrance Stairs-Number of Steps 2   Home Layout Two level;Bed/bath upstairs   Alternate Level Stairs-Number of Steps 12     Prior Function   Level of Independence Independent   Vocation Full time employment   Vocation Requirements [employer])   Leisure Likes to golf, travel, fish     Cognition   Overall Cognitive Status Within Functional Limits for tasks assessed     Sensation   Light Touch Impaired Detail  Light Touch Impaired Details --  numbness in T3/T4 level only   Hot/Cold Appears Intact     Coordination   Gross Motor Movements are Fluid and Coordinated Yes   Fine Motor Movements are Fluid and Coordinated Yes     Posture/Postural Control   Posture/Postural Control Postural limitations   Postural Limitations Rounded Shoulders   Posture Comments no tenderness to palpation over cervical spinous processes     ROM / Strength   AROM / PROM / Strength AROM     AROM   Overall AROM  Deficits;Due to pain   AROM Assessment Site Cervical   Cervical Flexion 54 deg   Cervical Extension 30 deg   Cervical - Right Rotation 44 deg  increased pain to the R, note limited facet mobility at C4/5   Cervical - Left Rotation 58 deg  increased pain and decreased facet mobility at C4/5     Flexibility   Soft Tissue Assessment /Muscle Length no     Transfers   Transfers Sit to Stand;Stand  to Sit   Sit to Stand 7: Independent   Stand to Sit 7: Independent     Ambulation/Gait   Ambulation/Gait Yes   Ambulation/Gait Assistance 7: Independent   Assistive device None   Gait Pattern Within Functional Limits                           PT Education - 07/29/16 1211    Education provided Yes   Education Details Education on evaluation findings, self mobilization exercises and stretching HEP, see pt instruction, POC   Person(s) Educated Patient   Methods Explanation;Demonstration;Handout   Comprehension Verbalized understanding;Returned demonstration          PT Short Term Goals - 07/29/16 1221      PT SHORT TERM GOAL #1   Title =LTG's           PT Long Term Goals - 07/29/16 1221      PT LONG TERM GOAL #1   Title Pt will be independent with HEP in order to indicate improved functional mobility and decreased pain.  (Target Date: 09/23/16 or by 5th visit)   Time 4   Period Weeks   Status New     PT LONG TERM GOAL #2   Title Pt will improve NDI score by 20% from baseline in order to indicate improved perceived pain related to neck.     Time 4   Period Weeks   Status New     PT LONG TERM GOAL #3   Title Pt will improve R cervical rotation to at least 50 deg and L cervical rotation to at least 65 deg in order to indicate improved ability to complete ADLs, work activities and leisure activities.     Time 4   Period Weeks   Status New     PT LONG TERM GOAL #4   Title Pt will report no more than 2/10 neck pain with R and L cervical rotation in order to indicate that pain is decreased limiting factor in mobility.     Time 4   Period Weeks   Status New               Plan - 07/29/16 1214    Clinical Impression Statement Pt presents with diagnosis of transverse myelitis on 7/10/1`7 with numbness across T3/4 dermatome and pain in neck, esp with rotation.  Note that he is receiving 3/3 steriod injections  today and 10/10 B12 shots tomorrow  to decrease inflammation.  Pt notes marked improvement in symptoms from last weekend until now.  Still has some numbess in area mentioned before and pain in neck with limited mobility.  Upon PT evaluation, note that pt with decreased ROM with R and L rotation (R more than L), pain with rotation, esp to the R and tightness in posterior deltoid/scapular region, limiting pts ability to participate in work and activities.  Pt is of evolving presentation and mild complexity from PT POC standpoint.  Pt will benefit from skilled OP neuro to address deficits.     Rehab Potential Excellent   Clinical Impairments Affecting Rehab Potential decreased ability to attend therapy-wants self program for home   PT Frequency 1x / week   PT Duration 4 weeks  POC is for 8 weeks due to planned trips   PT Treatment/Interventions ADLs/Self Care Home Management;Electrical Stimulation;Moist Heat;Traction;Ultrasound;Therapeutic activities;Therapeutic exercise;Neuromuscular re-education;Patient/family education;Manual techniques;Passive range of motion   PT Next Visit Plan check compliance with HEP-add as needed, Please give NDI for goals   PT Home Exercise Plan provided with self mobilization and post deltoid stretch, see pt instruction 07/29/16   Consulted and Agree with Plan of Care Patient      Patient will benefit from skilled therapeutic intervention in order to improve the following deficits and impairments:  Decreased mobility, Decreased range of motion, Hypomobility, Impaired perceived functional ability, Impaired flexibility, Improper body mechanics, Postural dysfunction, Impaired sensation, Impaired UE functional use  Visit Diagnosis: Cervicalgia - Plan: PT plan of care cert/re-cert  Abnormal posture - Plan: PT plan of care cert/re-cert      G-Codes - [id number]    Functional Assessment Tool Used cervical ROM flex 54 deg, ext 30 deg, R rotation 44 deg, L rotation 58 deg (both rotation with pain)    Functional Limitation Changing and maintaining body position   Changing and Maintaining Body Position Current Status [id number]) At least 40 percent but less than 60 percent impaired, limited or restricted   Changing and Maintaining Body Position Goal Status [id number]) At least 1 percent but less than 20 percent impaired, limited or restricted       Problem List Patient Active Problem List   Diagnosis Date Noted  . Hyperlipidemia 05/14/2014  . Inguinal hernia bilateral, non-recurrent 06/14/2011    Cameron Sprang, PT, MPT [facility] [address] [place], [place], [address] Phone: [phone]   Fax:  [phone] 07/29/16, 12:35 PM  Name: Tyne [place] MRN: [id number] Date of Birth: 1941/07/19

## 2016-07-29 NOTE — Patient Instructions (Addendum)
  Neck Self Mobilization  - Sit upright, in good thoracic and cervical alignment - Place the edge of a towel near the most stiff and painful part of your neck - Use one hand to stabilize the towel down along your chest, with your other hand pull the towel forward and upward causing the neck to rotate in and the other to rotate you neck into the direction that is most restricted,  having the hand pulling the towel guide the motion - Watch out for trunk rotation that might occur as an incorrect substitution pattern  Repeat 5 times each direction, holding for 10-15 seconds.  Repeat 2-3 times a day as needed.         POSTERIOR DELTOID STRETCH  Bring the involved arm across chest.  Grasp elbow and pull toward chest until you feel a stretch in the back of the upper arm and shoulder. Hold _60___ seconds; relax; repeat 2-3 times.

## 2016-08-03 LAB — OTHER SOLSTAS TEST

## 2016-08-05 ENCOUNTER — Encounter: Payer: Self-pay | Admitting: Diagnostic Neuroimaging

## 2016-08-18 ENCOUNTER — Ambulatory Visit (INDEPENDENT_AMBULATORY_CARE_PROVIDER_SITE_OTHER): Payer: Commercial Managed Care - PPO | Admitting: Diagnostic Neuroimaging

## 2016-08-18 ENCOUNTER — Encounter: Payer: Self-pay | Admitting: Diagnostic Neuroimaging

## 2016-08-18 VITALS — BP 145/90 | HR 80 | Wt 173.0 lb

## 2016-08-18 DIAGNOSIS — G373 Acute transverse myelitis in demyelinating disease of central nervous system: Secondary | ICD-10-CM | POA: Diagnosis not present

## 2016-08-18 DIAGNOSIS — R200 Anesthesia of skin: Secondary | ICD-10-CM | POA: Diagnosis not present

## 2016-08-18 DIAGNOSIS — M542 Cervicalgia: Secondary | ICD-10-CM | POA: Diagnosis not present

## 2016-08-18 NOTE — Progress Notes (Signed)
[facility]  PATIENT: Alec Snyder DOB: 1941/10/12  REFERRING CLINICIAN: [name] HISTORY FROM: patient  REASON FOR VISIT: follow up   HISTORICAL  CHIEF COMPLAINT:  Chief Complaint  Patient presents with  . Other    rm 6, transverse myelitis, "difficulty standing up-inner thighs ache; neck pain; occas pain in inner upper R arm; pain bleow L shoulder blade"  . Follow-up    1 month    HISTORY OF PRESENT ILLNESS:   UPDATE 08/18/16: Since last visit, sxs are stable to slightly worsening. Has been treated with IV solumedrol x 3 days, but no benefit. Still with intermittent aching / sharp pain in left upper back, neck and thighs related to certain movements. Main problem is weakness rather than pain, but are having both.   UPDATE 07/08/16: Since last visit, sxs are stable. Still with neck and shoulder pain. Now right arm is hurting more. Was able to play in golf tournament with son. Test results reviewed.  UPDATE 06/21/16: Since last visit, overall having more neck pain. No change in sxs with prednisone pack. More chest stiffness. Balance, bowel, bladder function normal.  Left shoulder numbness has improved.  PRIOR HPI (06/02/16): 75 year old right-handed male here for evaluation of left upper extremity abnormal sensation, left axillary abnormal sensation, and abnormal MRI cervical spine. March 2017, patient noted abnormal neck sensation, when looking to the left and taking a golf swing. Around April 2017 patient noticed new onset of hurting and burning sensation in his left armpit/axillary region and into his left chest. Patient initially thought he had rubbed onto poison ivy or possibly was developing shingles. Symptoms transitioned over the next few days into a numbness and tingling sensation. Patient also felt some heaviness in his left arm. Patient was still able to perform his day-to-day activities including playing golf. Patient went to his work wellness clinic (Dr.  Posey Pronto) for evaluation of of symptoms in May 2017, had cervical spine x-ray which showed advanced cervical spondylosis. Due to persistent symptoms patient followed up with PCP ([name]) in June 2017 for annual physical and then MRI of the cervical spine was ordered. On 06/01/2016, abnormal signal was noted within the spinal cord centrally at C6 and towards the left posterior lateral column extending inferiorly into the thoracic spinal cord. Follow-up postcontrast imaging demonstrated abnormal enhancement in the left posterior column. Patient then referred to me for urgent evaluation. Today patient reports continued abnormal sensation in the left armpit, left chest, left posterior scapular region. Overall symptoms have been stable without worsening. Patient denies any prodromal infections, vaccinations, traumas or change in medications. No problems with right arm, bilateral legs, balance or walking. No headaches or vision changes.   REVIEW OF SYSTEMS: Full 14 system review of systems performed and negative with exception of: aching muscles.  ALLERGIES: No Known Allergies  HOME MEDICATIONS: Outpatient Medications Prior to Visit  Medication Sig Dispense Refill  . aspirin 81 MG tablet Take 81 mg by mouth daily.      . Cyanocobalamin (VITAMIN B-12) 5000 MCG SUBL Place under the tongue.    . cyclobenzaprine (FLEXERIL) 10 MG tablet Take 1 tablet (10 mg total) by mouth 3 (three) times daily as needed for muscle spasms. 90 tablet 3  . gabapentin (NEURONTIN) 300 MG capsule Take 1-2 capsules (300-600 mg total) by mouth 3 (three) times daily. 180 capsule 6  . levothyroxine (SYNTHROID, LEVOTHROID) 125 MCG tablet Take 125 mcg by mouth daily.      . rosuvastatin (CRESTOR) 20  MG tablet Take 20 mg by mouth daily.     No facility-administered medications prior to visit.     PAST MEDICAL HISTORY: Past Medical History:  Diagnosis Date  . Cancer ([place])    melanoma  . Hemorrhoids    occasional  . Hyperlipemia    . Hypothyroid   . Inguinal hernia    bilateral  . Joint pain    minor  . Wears glasses     PAST SURGICAL HISTORY: Past Surgical History:  Procedure Laterality Date  . BLEPHAROPLASTY Bilateral 2016  . HERNIA REPAIR  2012   BIH  . MELANOMA EXCISION      FAMILY HISTORY: Family History  Problem Relation Age of Onset  . Kidney failure Father   . Diabetes Mother   . Arthritis Brother   . Arthritis Sister     SOCIAL HISTORY:  Social History   Social History  . Marital status: Married    Spouse name: [name]  . Number of children: 2  . Years of education: 16   Occupational History  .      retired, Airline pilot   Social History Main Topics  . Smoking status: Former Smoker    Packs/day: 1.50    Years: 3.00    Types: Cigarettes    Quit date: 05/14/1974  . Smokeless tobacco: Never Used  . Alcohol use 1.2 oz/week    2 Standard drinks or equivalent per week     Comment: occas 2-4 beers  weekly  . Drug use: No  . Sexual activity: Not on file   Other Topics Concern  . Not on file   Social History Narrative   Lives with spouse   Caffeine use- coffee  3-4 cups daily     PHYSICAL EXAM  GENERAL EXAM/CONSTITUTIONAL: Vitals:  Vitals:   08/18/16 1342  BP: (!) 145/90  Pulse: 80  Weight: 173 lb (78.5 kg)   Body mass index is 27.1 kg/m. No exam data present  Patient is in no distress; well developed, nourished and groomed; neck is supple  CARDIOVASCULAR:  Examination of carotid arteries is normal; no carotid bruits  Regular rate and rhythm, no murmurs  Examination of peripheral vascular system by observation and palpation is normal  EYES:  Ophthalmoscopic exam of optic discs and posterior segments is normal; no papilledema or hemorrhages  MUSCULOSKELETAL:  Gait, strength, tone, movements noted in Neurologic exam below  NEUROLOGIC: MENTAL STATUS:  No flowsheet data found.  awake, alert, oriented to person, place and time  recent and remote  memory intact  normal attention and concentration  language fluent, comprehension intact, naming intact,   fund of knowledge appropriate  CRANIAL NERVE:   2nd - no papilledema on fundoscopic exam  2nd, 3rd, 4th, 6th - pupils equal and reactive to light, visual fields full to confrontation, extraocular muscles intact, no nystagmus  5th - facial sensation symmetric  7th - facial strength symmetric  8th - hearing intact  9th - palate elevates symmetrically, uvula midline  11th - shoulder shrug symmetric  12th - tongue protrusion midline  MOTOR:   normal bulk and tone, full strength in the BUE, BLE  SENSORY:   normal and symmetric to light touch  COORDINATION:   finger-nose-finger, fine finger movements normal  REFLEXES:   deep tendon reflexes present and symmetric  GAIT/STATION:   narrow based gait; SLIGHT LIMP IN LEFT LEG    DIAGNOSTIC DATA (LABS, IMAGING, TESTING) - I reviewed patient records, labs, notes, testing and imaging  myself where available.  Lab Results  Component Value Date   WBC 9.4 06/21/2016   HGB 14.3 07/07/2011   HCT 40.2 06/21/2016   MCV 91 06/21/2016   PLT 333 06/21/2016      Component Value Date/Time   NA 142 06/21/2016 1311   K 4.4 06/21/2016 1311   CL 98 06/21/2016 1311   CO2 24 06/21/2016 1311   GLUCOSE 88 06/21/2016 1311   GLUCOSE 108 (H) 07/07/2011 0956   BUN 15 06/21/2016 1311   CREATININE 0.93 06/21/2016 1311   CALCIUM 9.7 06/21/2016 1311   PROT 7.0 06/21/2016 1311   ALBUMIN 4.2 06/21/2016 1311   AST 19 06/21/2016 1311   ALT 23 06/21/2016 1311   ALKPHOS 116 06/21/2016 1311   BILITOT 0.6 06/21/2016 1311   GFRNONAA 80 06/21/2016 1311   GFRAA 93 06/21/2016 1311   No results found for: CHOL, HDL, LDLCALC, LDLDIRECT, TRIG, CHOLHDL Lab Results  Component Value Date   HGBA1C 6.1 (H) 06/21/2016   Lab Results  Component Value Date   VITAMINB12 217 06/21/2016   Lab Results  Component Value Date   TSH 1.430  06/21/2016    06/01/16 MRI cervical spine [I reviewed images myself and agree with interpretation. Other considerations would include spinal cord vascular malformation, such as dural AV fistula or other AVM. -VRP]  - Abnormal spinal cord beginning at C6 and extending into the upper thoracic spine. There is hyperintense signal in the central cord and in the posterior column on the left. The posterior column shows intense enhancement . Cord is not enlarged. - This may represent an area of healing transverse myelitis. Neoplasm appears unlikely given the multi segment extent of the abnormality and lack of cord enlargement. Cord infarct is a consideration. B12 deficiency typically is higher in the cervical cord and involves both posterior columns.  06/03/16 MRI brain  - Normal for age MRI appearance of the brain.  06/03/16 MRI thoracic spine  - The bulk of the abnormal spinal cord lesion was imaged on the recent cervical exam. There is abnormal T2 signal within the cord from C5-C6 to T5-T6. There is abnormal dorsal spinal cord enhancement from C6-C7 to T3-T4. There appears to be a small syrinx at those levels associated with the abnormality. 3. The spinal cord below C6 is normal. No associated osseous or paraspinal abnormality. No associated spinal cord vasculature is evident. - Top differential considerations include: Primary spinal cord tumor, lymphoma or metastatic disease to the cord, and less likely an inflammatory process (such as sarcoidosis). Consider follow-up CT chest abdomen and pelvis (oral and IV contrast preferred) to evaluate for any non CNS primary tumor site. CSF analysis may also be valuable.  06/25/16 CT chest / abd / pelvis 1. No evidence of primary malignancy or metastatic disease in the chest, abdomen or pelvis. 2. Minimal colonic diverticulosis. 3. Dense coronary artery atheromatous calcifications. 4. Aortic atherosclerosis. 5. Degenerative right sacroiliitis.  06/28/16 CSF studies  --> WBC 11, RBC 11, glucose 70, protein 50, VDRL non-reactive, cultures negative, cytology negative, OCB (2 well defined gamma restriction bands that are also present in the patient's corresponding serum sample, but some bands in the CSF are more prominent)     ASSESSMENT AND PLAN  75 y.o. year old male here with new onset burning, tingling, numbness sensation in left axillary region and left scapular region, with subjective muscle weakness and left upper extremity. Patient found to have unusual abnormal MRI cervical spine with longitudinally extensive spinal cord lesion  centrally and within the left posterior column region extending from C6 level down to visualized upper thoracic spinal cord region. Would favor inflammatory, autoimmune, vascular or neoplastic etiologies.   Ddx: spinal cord inflammatory/autoimmune, vascular, neoplastic etiologies  1. Transverse myelitis ([place])   2. Numbness   3. Neck pain      PLAN: - taper off gabapentin and cyclobenzaprine - ibuprofen 400-800mg  BID prn - follow up second opinion at [facility] This Encounter  Procedures  . CK  . Aldolase  . Vitamin B12   Return in about 2 months (around 10/18/2016).    Penni Bombard, MD [id number], [id number] PM Certified in Neurology, Neurophysiology and Neuroimaging  [facility] [address] [place], [place] [address] [phone]

## 2016-08-18 NOTE — Progress Notes (Signed)
Patient requested the following be added to his EMR:  Flu vaccine 10/23/15, 08/27/14, 09/13/13 Tdap 12/16/14, 01/31/13 Flulaval 08/31/10 pneumoc 13-val conj-dip 05/09/14 Pneumococcal polysaccharide PPV23 07/08/10 EKG 04/13/16, 02/20/16

## 2016-08-18 NOTE — Patient Instructions (Addendum)
Thank you for coming to see [place] at [facility]. I hope we have been able to provide you high quality care today.  You may receive a patient satisfaction survey over the next few weeks. We would appreciate your feedback and comments so that we may continue to improve ourselves and the health of our patients.  - I will check labs  - taper off gabapentin and cyclobenzaprine  - use ibuprofen 400-836m twice a day  - [facility]  ~~~~~~~~~~~~~~~~~~~~~~~~~~~~~~~~~~~~~~~~~~~~~~~~~~~~~~~~~~~~~~~~~  DR. Arden Axon'S GUIDE TO HAPPY AND HEALTHY LIVING These are some of my general health and wellness recommendations. Some of them may apply to you better than others. Please use common sense as you try these suggestions and feel free to ask me any questions.   ACTIVITY/FITNESS Mental, social, emotional and physical stimulation are very important for brain and body health. Try learning a new activity (arts, music, language, sports, games).  Keep moving your body to the best of your abilities. You can do this at home, inside or outside, the park, community center, gym or anywhere you like. Consider a physical therapist or personal trainer to get started. Consider the app Sworkit. Fitness trackers such as smart-watches, smart-phones or Fitbits can help as well.   NUTRITION Eat more plants: colorful vegetables, nuts, seeds and berries.  Eat less sugar, salt, preservatives and processed foods.  Avoid toxins such as cigarettes and alcohol.  Drink water when you are thirsty. Warm water with a slice of lemon is an excellent morning drink to start the day.  Consider these websites for more information The Nutrition Source (hhttps://www.henry-hernandez.biz/ Precision Nutrition (wWindowBlog.ch   RELAXATION Consider practicing mindfulness meditation or other relaxation techniques such as deep breathing, prayer, yoga,  tai chi, massage. See website mindful.org or the apps Headspace or Calm to help get started.   SLEEP Try to get at least 7-8+ hours sleep per day. Regular exercise and reduced caffeine will help you sleep better. Practice good sleep hygeine techniques. See website sleep.org for more information.   PLANNING Prepare estate planning, living will, healthcare POA documents. Sometimes this is best planned with the help of an attorney. Theconversationproject.org and agingwithdignity.org are excellent resources.

## 2016-08-19 LAB — ALDOLASE: Aldolase: 2.7 U/L — ABNORMAL LOW (ref 3.3–10.3)

## 2016-08-19 LAB — CK: CK TOTAL: 34 U/L (ref 24–204)

## 2016-08-19 LAB — VITAMIN B12: Vitamin B-12: >2000 pg/mL — ABNORMAL HIGH (ref 211–946)

## 2016-08-20 ENCOUNTER — Telehealth: Payer: Self-pay | Admitting: *Deleted

## 2016-08-20 NOTE — Telephone Encounter (Signed)
Per Dr Leta Baptist, spoke with patient's wife and informed her that patient's lab results are okay. She stated he saw results on My Chart and was concerned this morning about elevated Vit B12; advised her that is not a problem. Advised if he is taking Vit B12 he can lower his dose and/or how many days a week he takes it. She then stated he was concerned over CK lab; advised her it it totally normal, is on the low side of the normal range.  Advised her his Aldolase level is low which is fine as a high level can indicate muscle or liver damage. She verbalized understanding, stated she would pass along the information to her husband.

## 2016-08-23 ENCOUNTER — Ambulatory Visit: Payer: Commercial Managed Care - PPO | Attending: Diagnostic Neuroimaging | Admitting: Physical Therapy

## 2016-08-23 ENCOUNTER — Encounter: Payer: Self-pay | Admitting: Physical Therapy

## 2016-08-23 DIAGNOSIS — M542 Cervicalgia: Secondary | ICD-10-CM | POA: Diagnosis not present

## 2016-08-23 DIAGNOSIS — R293 Abnormal posture: Secondary | ICD-10-CM | POA: Insufficient documentation

## 2016-08-23 NOTE — Patient Instructions (Addendum)
Roll    Roll shoulders in backwards direction. Repeat _10__ times. Do _1-2_ times per day.  Copyright  VHI. All rights reserved.    Upper Limb Neural Tension: Radial I    Place right arm across low back and tuck hand under buttocks. Turn head down toward left side looking down toward the shoulder, only go as far as a stretch is felt, do not push through pain. Hold for 30 seconds. Repeat toward the other side. Repeat __3__ times each way.  Do __1-2__ sessions per day or as needed.  http://orth.exer.us/408   Copyright  VHI. All rights reserved.   Scapular Retraction (Standing)    Either standing or sitting: With arms at sides, squeeze shoulder blades together. Hold for 5 seconds. Repeat _10_ times per set.  Do __1-2__ sessions per day.  http://orth.exer.us/944   Copyright  VHI. All rights reserved.

## 2016-08-24 NOTE — Therapy (Signed)
[facility] [address] [place], [place], [address] Phone: [phone]   Fax:  [phone]  Physical Therapy Treatment  Patient Details  Name: Alec Snyder MRN: [id number] Date of Birth: 10-03-1941 Referring Provider: Andrey Spearman, MD  Encounter Date: 08/23/2016      PT End of Session - 08/23/16 1456    Visit Number 2   Number of Visits 5  including eval   Date for PT Re-Evaluation 09/27/16  did 8 wk re-eval because going out of town for 3 weeks   Authorization Type UMR, UHC PPO   PT Start Time 1450   PT Stop Time 1534   PT Time Calculation (min) 44 min   Activity Tolerance Patient tolerated treatment well   Behavior During Therapy [facility] for tasks assessed/performed      Past Medical History:  Diagnosis Date  . Cancer ([place])    melanoma  . Hemorrhoids    occasional  . Hyperlipemia   . Hypothyroid   . Inguinal hernia    bilateral  . Joint pain    minor  . Wears glasses     Past Surgical History:  Procedure Laterality Date  . BLEPHAROPLASTY Bilateral 2016  . HERNIA REPAIR  2012   BIH  . MELANOMA EXCISION      There were no vitals filed for this visit.      Subjective Assessment - 08/23/16 1453    Subjective Had the injections and feels a little better. Still with some neck and left under arm pain. Has done the exercises some at home, they seem to help him.   Limitations Walking   Patient Stated Goals "I want to get some exercises so that I can continue my "self therapy" and continue to exercise and be active."    Currently in Pain? Yes   Pain Score 3   with movement, 0/10 at rest   Pain Orientation Right;Left   Pain Descriptors / Indicators Aching;Sore   Pain Type Acute pain   Pain Radiating Towards neck and arms   Pain Onset More than a month ago   Pain Frequency Constant   Aggravating Factors  turning, driving   Pain Relieving Factors ibuprofen           [facility] Adult PT  Treatment/Exercise - 08/23/16 1534      Manual Therapy   Manual Therapy Muscle Energy Technique;Manual Traction;Soft tissue mobilization;Myofascial release   Manual therapy comments pt reported decreased pain to 0/10 after manual therapy.   Soft tissue mobilization to upper traps and scalenes   Myofascial Release to bil upper traps and scalenes   Manual Traction manual distraction progressing to cervical distraction with towel   Muscle Energy Technique had pt press bil shoulder down concurrent with cervical distraction using towel, followed by scapular depression concurrent with towel distraction.                                                            Added to HEP Roll    Roll shoulders in backwards direction. Repeat _10__ times. Do _1-2_ times per day.  Copyright  VHI. All rights reserved.    Upper Limb Neural Tension: Radial I    Place right arm across low back and tuck hand under buttocks. Turn head down toward left  side looking down toward the shoulder, only go as far as a stretch is felt, do not push through pain. Hold for 30 seconds. Repeat toward the other side. Repeat __3__ times each way.  Do __1-2__ sessions per day or as needed.  http://orth.exer.us/408   Copyright  VHI. All rights reserved.   Scapular Retraction (Standing)    Either standing or sitting: With arms at sides, squeeze shoulder blades together. Hold for 5 seconds. Repeat _10_ times per set.  Do __1-2__ sessions per day.  http://orth.exer.us/944   Copyright  VHI. All rights reserved.          PT Education - 08/23/16 1527    Education provided Yes   Education Details added to [employer]) Educated Patient   Methods Explanation;Demonstration;Verbal cues;Handout   Comprehension Verbalized understanding;Returned demonstration;Need further instruction          PT Short Term Goals - 07/29/16 1221      PT SHORT TERM GOAL #1   Title =LTG's           PT Long Term Goals -  07/29/16 1221      PT LONG TERM GOAL #1   Title Pt will be independent with HEP in order to indicate improved functional mobility and decreased pain.  (Target Date: 09/23/16 or by 5th visit)   Time 4   Period Weeks   Status New     PT LONG TERM GOAL #2   Title Pt will improve NDI score by 20% from baseline in order to indicate improved perceived pain related to neck.     Time 4   Period Weeks   Status New     PT LONG TERM GOAL #3   Title Pt will improve R cervical rotation to at least 50 deg and L cervical rotation to at least 65 deg in order to indicate improved ability to complete ADLs, work activities and leisure activities.     Time 4   Period Weeks   Status New     PT LONG TERM GOAL #4   Title Pt will report no more than 2/10 neck pain with R and L cervical rotation in order to indicate that pain is decreased limiting factor in mobility.     Time 4   Period Weeks   Status New        08/23/16 1457  Plan  Clinical Impression Statement Today's session continued to work on decreased pain and postural strengthening/awareness with pt reporting decreased pain to 0/10 after session. Added to pt's HEP today without any issues reported. Pt is making steady progress toward goals.   Pt will benefit from skilled therapeutic intervention in order to improve on the following deficits Decreased mobility;Decreased range of motion;Hypomobility;Impaired perceived functional ability;Impaired flexibility;Improper body mechanics;Postural dysfunction;Impaired sensation;Impaired UE functional use  Rehab Potential Excellent  Clinical Impairments Affecting Rehab Potential decreased ability to attend therapy-wants self program for home  PT Frequency 1x / week  PT Duration 4 weeks (POC is for 8 weeks due to planned trips)  PT Treatment/Interventions ADLs/Self Care Home Management;Electrical Stimulation;Moist Heat;Traction;Ultrasound;Therapeutic activities;Therapeutic exercise;Neuromuscular  re-education;Patient/family education;Manual techniques;Passive range of motion  PT Next Visit Plan check compliance with HEP-add as needed, Please give NDI for goals (oops, sorry I forgot too today)  PT Home Exercise Plan provided with self mobilization and post deltoid stretch, see pt instruction 07/29/16  Consulted and Agree with Plan of Care Patient      Patient will benefit from skilled therapeutic intervention in  order to improve the following deficits and impairments:  Decreased mobility, Decreased range of motion, Hypomobility, Impaired perceived functional ability, Impaired flexibility, Improper body mechanics, Postural dysfunction, Impaired sensation, Impaired UE functional use  Visit Diagnosis: Cervicalgia  Abnormal posture     Problem List Patient Active Problem List   Diagnosis Date Noted  . Hyperlipidemia 05/14/2014  . Inguinal hernia bilateral, non-recurrent 06/14/2011    Willow Ora, PTA, [facility] [address], [place], [place] [address] [phone] 08/24/16, 4:46 PM   Name: Alec Snyder MRN: [id number] Date of Birth: 28-Dec-1940

## 2016-08-30 ENCOUNTER — Ambulatory Visit: Payer: Commercial Managed Care - PPO | Admitting: Physical Therapy

## 2016-08-30 ENCOUNTER — Encounter: Payer: Self-pay | Admitting: Physical Therapy

## 2016-08-30 DIAGNOSIS — R293 Abnormal posture: Secondary | ICD-10-CM

## 2016-08-30 DIAGNOSIS — M542 Cervicalgia: Secondary | ICD-10-CM | POA: Diagnosis not present

## 2016-08-30 NOTE — Patient Instructions (Addendum)
Scapular Retraction: Abduction / Extension (Prone)    Lie with arms out from sides 90. Pinch shoulder blades together and raise arms a few inches from floor/bed. Repeat __10_ times per set. Do __1__ sets per session. Do __1-2__ sessions per day.  http://orth.exer.us/958   Copyright  VHI. All rights reserved.  Scapular Retraction: Flexion (Prone)    Lie with arms forward. Pinch shoulder blades together and raise arms a few inches from floor/bed. Repeat __10__ times per set. Do __1__ sets per session. Do __1-2__ sessions per day.  http://orth.exer.us/960   Copyright  VHI. All rights reserved.  Scapular Retraction (Prone)    Lie with arms at sides. Pinch shoulder blades together and raise arms a few inches from floor/bed. Repeat __10__ times per set. Do __1__ sets per session. Do __1-2__ sessions per day.  http://orth.exer.us/954   Copyright  VHI. All rights reserved.  Strengthening: Chest Pull - Resisted    With resistive band looped around each hand, and arms straight out in front, stretch band across chest. Hold for 5 seconds. _10__ times per set. Do __1__ sets per session. Do __1-2__ sessions per day.  http://orth.exer.us/926   Copyright  VHI. All rights reserved.  Strengthening: Resisted Extension    Anchor tubing in door, hold tubing both hands, arm forward. Pull arms back, elbow straight. Hold for 5 seconds. Repeat _10___ times per set. Do __1__ sets per session. Do _1-2__ sessions per day.  http://orth.exer.us/832   Copyright  VHI. All rights reserved.   Resistive Band Rowing    With resistive band anchored in door, grasp both ends. Keeping elbows bent, pull back, squeezing shoulder blades together. Hold __5__ seconds. Repeat __10__ times. Do _1-2___ sessions per day.  http://gt2.exer.us/97   Copyright  VHI. All rights reserved.

## 2016-08-31 NOTE — Therapy (Signed)
[facility] [address] [place], [place], [address] Phone: [phone]   Fax:  [phone]  Physical Therapy Treatment  Patient Details  Name: Alec Snyder MRN: [id number] Date of Birth: 07/07/1941 Referring Provider: Andrey Spearman, MD  Encounter Date: 08/30/2016      PT End of Session - 08/30/16 1456    Visit Number 3   Number of Visits 5  including eval   Date for PT Re-Evaluation 09/27/16  did 8 wk re-eval because going out of town for 3 weeks   Authorization Type UMR, UHC PPO   PT Start Time 1448   PT Stop Time 1530   PT Time Calculation (min) 42 min   Activity Tolerance Patient tolerated treatment well   Behavior During Therapy [facility] for tasks assessed/performed      Past Medical History:  Diagnosis Date  . Cancer ([place])    melanoma  . Hemorrhoids    occasional  . Hyperlipemia   . Hypothyroid   . Inguinal hernia    bilateral  . Joint pain    minor  . Wears glasses     Past Surgical History:  Procedure Laterality Date  . BLEPHAROPLASTY Bilateral 2016  . HERNIA REPAIR  2012   BIH  . MELANOMA EXCISION      There were no vitals filed for this visit.      Subjective Assessment - 08/30/16 1450    Subjective Feeling better. Reports the shoulder rolls/scapular squeezes are "really helping, I'm doing them religiously". Working on getting a second opinion about the Transverse Myelitis (has appt at [facility], and looking into [place]). No falls to report.                                               Limitations Walking   Patient Stated Goals "I want to get some exercises so that I can continue my "self therapy" and continue to exercise and be active."    Currently in Pain? Yes   Pain Score 2    Pain Location Neck   Pain Orientation Lower  scapular area   Pain Descriptors / Indicators Aching;Sore   Pain Type Acute pain   Pain Onset More than a month ago   Pain Frequency Intermittent   Aggravating Factors  getting in/out of car,   Pain Relieving Factors ibupofen            [facility] Adult PT Treatment/Exercise - 08/30/16 1457      Shoulder Exercises: ROM/Strengthening   UBE (Upper Arm Bike) level 2.0 x2 minutes each forward and backwards with cues on form/posture     Manual Therapy   Manual Therapy Muscle Energy Technique;Manual Traction;Soft tissue mobilization;Myofascial release   Manual therapy comments pt reported decreased pain to 0/10 after manual therapy.   Soft tissue mobilization to upper traps, rhomboids and scalenes   Myofascial Release to bil upper trap and subscapular muscles   Manual Traction manual distraction progressing to cervical distraction with towel, 30 sec holds each   Muscle Energy Technique had pt press bil shoulder down concurrent with cervical distraction using towel, followed by scapular depression concurrent with towel distraction.  Added to HEP:   Scapular Retraction: Abduction / Extension (Prone)    Lie with arms out from sides 90. Pinch shoulder blades together and raise arms a few inches from floor/bed. Repeat __10_ times per set. Do __1__ sets per session. Do __1-2__ sessions per day.  http://orth.exer.us/958   Copyright  VHI. All rights reserved.  Scapular Retraction: Flexion (Prone)    Lie with arms forward. Pinch shoulder blades together and raise arms a few inches from floor/bed. Repeat __10__ times per set. Do __1__ sets per session. Do __1-2__ sessions per day.  http://orth.exer.us/960   Copyright  VHI. All rights reserved.  Scapular Retraction (Prone)    Lie with arms at sides. Pinch shoulder blades together and raise arms a few inches from floor/bed. Repeat __10__ times per set. Do __1__ sets per session. Do __1-2__ sessions per day.  http://orth.exer.us/954   Copyright  VHI. All rights reserved.  Strengthening: Chest Pull - Resisted    With  resistive band looped around each hand, and arms straight out in front, stretch band across chest. Hold for 5 seconds. _10__ times per set. Do __1__ sets per session. Do __1-2__ sessions per day.  http://orth.exer.us/926   Copyright  VHI. All rights reserved.  Strengthening: Resisted Extension    Anchor tubing in door, hold tubing both hands, arm forward. Pull arms back, elbow straight. Hold for 5 seconds. Repeat _10___ times per set. Do __1__ sets per session. Do _1-2__ sessions per day.  http://orth.exer.us/832   Copyright  VHI. All rights reserved.   Resistive Band Rowing    With resistive band anchored in door, grasp both ends. Keeping elbows bent, pull back, squeezing shoulder blades together. Hold __5__ seconds. Repeat __10__ times. Do _1-2___ sessions per day.  http://gt2.exer.us/97   Copyright  VHI. All rights reserved.          PT Education - 08/30/16 1531    Education provided Yes   Education Details Added strengthening to [employer]) Educated Patient   Methods Explanation;Demonstration;Verbal cues;Handout   Comprehension Verbalized understanding;Returned demonstration          PT Short Term Goals - 07/29/16 1221      PT SHORT TERM GOAL #1   Title =LTG's           PT Long Term Goals - 07/29/16 1221      PT LONG TERM GOAL #1   Title Pt will be independent with HEP in order to indicate improved functional mobility and decreased pain.  (Target Date: 09/23/16 or by 5th visit)   Time 4   Period Weeks   Status New     PT LONG TERM GOAL #2   Title Pt will improve NDI score by 20% from baseline in order to indicate improved perceived pain related to neck.     Time 4   Period Weeks   Status New     PT LONG TERM GOAL #3   Title Pt will improve R cervical rotation to at least 50 deg and L cervical rotation to at least 65 deg in order to indicate improved ability to complete ADLs, work activities and leisure activities.     Time 4   Period  Weeks   Status New     PT LONG TERM GOAL #4   Title Pt will report no more than 2/10 neck pain with R and L cervical rotation in order to indicate that pain is decreased limiting factor in mobility.     Time 4  Period Weeks   Status New           Plan - 08/30/16 1456    Clinical Impression Statement Today's session continued to address pain control and scapular strengthening. Pt reported decreased pain and tightness after session today. Added to pt's HEP today. Pt is making steady progress toward goals .   Rehab Potential Excellent   Clinical Impairments Affecting Rehab Potential decreased ability to attend therapy-wants self program for home   PT Frequency 1x / week   PT Duration 4 weeks  POC is for 8 weeks due to planned trips   PT Treatment/Interventions ADLs/Self Care Home Management;Electrical Stimulation;Moist Heat;Traction;Ultrasound;Therapeutic activities;Therapeutic exercise;Neuromuscular re-education;Patient/family education;Manual techniques;Passive range of motion   PT Next Visit Plan continue to work on scapular strengthening, ? NDI as it has not been completed for baseline   PT Home Exercise Plan provided with self mobilization and post deltoid stretch, see pt instruction 07/29/16   Consulted and Agree with Plan of Care Patient      Patient will benefit from skilled therapeutic intervention in order to improve the following deficits and impairments:  Decreased mobility, Decreased range of motion, Hypomobility, Impaired perceived functional ability, Impaired flexibility, Improper body mechanics, Postural dysfunction, Impaired sensation, Impaired UE functional use  Visit Diagnosis: Cervicalgia  Abnormal posture     Problem List Patient Active Problem List   Diagnosis Date Noted  . Hyperlipidemia 05/14/2014  . Inguinal hernia bilateral, non-recurrent 06/14/2011    Willow Ora, PTA, [facility] [address], [place], [place]  [address] [phone] 08/31/16, 9:09 PM   Name: Brian Giustino MRN: [id number] Date of Birth: 01-19-1941

## 2016-09-06 ENCOUNTER — Ambulatory Visit: Payer: Commercial Managed Care - PPO | Admitting: Rehabilitation

## 2016-09-13 ENCOUNTER — Ambulatory Visit: Payer: Commercial Managed Care - PPO | Attending: Diagnostic Neuroimaging | Admitting: Rehabilitation

## 2016-09-13 ENCOUNTER — Encounter: Payer: Self-pay | Admitting: Rehabilitation

## 2016-09-13 DIAGNOSIS — M542 Cervicalgia: Secondary | ICD-10-CM

## 2016-09-13 DIAGNOSIS — R293 Abnormal posture: Secondary | ICD-10-CM

## 2016-09-13 NOTE — Patient Instructions (Addendum)
   Foam roll series  Lay on the foam roll parallel to your spine.  Your hips and head should be supported on the foam roll.    Start with a pec stretch for approximately 2-3 minutes.  The angle of your arms will stretch different fibers in your pectoral muscle.  Start with T and advance to Y position for more aggressive stretch.  Avoid any shoulder discomfort.     Upper Limb Neural Tension: Radial I    Place right arm across low back and tuck hand under buttocks. Turn head down toward left side looking down toward the shoulder, only go as far as a stretch is felt, do not push through pain. Hold for 30 seconds. Repeat toward the other side. Repeat __3__ times each way.  Do __1-2__ sessions per day or as needed.  Added to HEP Roll    Roll shoulders in backwards direction. Repeat _10__ times. Do _1-2_ times per day.  Strengthening: Chest Pull - Resisted    With resistive band looped around each hand, and arms straight out in front, stretch band across chest. Hold for 5 seconds. _10__ times per set. Do __1__ sets per session. Do __1-2__ sessions per day.  http://orth.exer.us/926   Copyright  VHI. All rights reserved.  Strengthening: Resisted Extension    Anchor tubing in door, hold tubing both hands, arm forward. Pull arms back, elbow straight. Hold for 5 seconds. Repeat _10___ times per set. Do __1__ sets per session. Do _1-2__ sessions per day.

## 2016-09-13 NOTE — Therapy (Signed)
[facility] [address] [place], [place], [address] Phone: [phone]   Fax:  [phone]  Physical Therapy Treatment  Patient Details  Name: Alec Snyder MRN: [id number] Date of Birth: 08/19/1941 Referring Provider: Andrey Spearman, MD  Encounter Date: 09/13/2016      PT End of Session - 09/13/16 1531    Visit Number 4   Number of Visits 5  including eval   Date for PT Re-Evaluation 09/27/16  did 8 wk re-eval because going out of town for 3 weeks   Authorization Type UMR, UHC PPO   PT Start Time [id number]   PT Stop Time 1530   PT Time Calculation (min) 43 min   Activity Tolerance Patient tolerated treatment well   Behavior During Therapy [facility] for tasks assessed/performed      Past Medical History:  Diagnosis Date  . Cancer ([place])    melanoma  . Hemorrhoids    occasional  . Hyperlipemia   . Hypothyroid   . Inguinal hernia    bilateral  . Joint pain    minor  . Wears glasses     Past Surgical History:  Procedure Laterality Date  . BLEPHAROPLASTY Bilateral 2016  . HERNIA REPAIR  2012   BIH  . MELANOMA EXCISION      There were no vitals filed for this visit.      Subjective Assessment - 09/13/16 1452    Subjective Reports things going well, still likes shoulder rolls.     Limitations Walking   Patient Stated Goals "I want to get some exercises so that I can continue my "self therapy" and continue to exercise and be active."    Currently in Pain? Yes   Pain Score 1   tightness   Pain Location Chest   Pain Orientation Left   Pain Descriptors / Indicators Tightness   Pain Type Acute pain   Pain Onset More than a month ago   Pain Frequency Intermittent         MT:   Muscle Energy Technique;Manual Traction;Soft tissue mobilization;Myofascial release     Manual therapy comments pt reported decreased pain to 0/10 after manual therapy.   Soft tissue mobilization to upper traps and scalenes    Myofascial Release To L upper trap and subscapular muscles   Manual Traction manual distraction progressing to cervical distraction with soft tissue mobilization as stated above.    Mobilization Performed grade II thoracic mobilization in order to increase flexibility and decreased pain.  Pt states marked improvement following mobilization.                                                       TE:  Performed several exercises from HEP in efforts to condense for improved carryover with addition of single exercise; self thoracic mobilization and pectoral stretch over half foam roll with BUEs in 90 deg abduction progressing to "Y" position, both with 2 min hold.  Performed seated shoulder rolls.  Note pt with very little ROM, therefore educated on proper technique with emphasis on scapular retraction and depression.  Performed x 10 reps.  Seated scapular retraction with shoulder flexion moving to abduction (without band) to simulate HEP.  Cues for emphasis on upright posture.  Also discussed posture with standing shoulder extension to increase strength in B latissimus  dorsi muscles as well as posterior cervical stretch.  Pt verbalized and return demonstration.                          PT Education - 09/13/16 2012    Education provided Yes   Education Details compliance with condensed HEP   Person(s) Educated Patient   Methods Explanation;Demonstration;Handout   Comprehension Verbalized understanding;Returned demonstration          PT Short Term Goals - 07/29/16 1221      PT SHORT TERM GOAL #1   Title =LTG's           PT Long Term Goals - 07/29/16 1221      PT LONG TERM GOAL #1   Title Pt will be independent with HEP in order to indicate improved functional mobility and decreased pain.  (Target Date: 09/23/16 or by 5th visit)   Time 4   Period Weeks   Status New     PT LONG TERM GOAL #2   Title Pt will improve NDI score by 20% from baseline in order to  indicate improved perceived pain related to neck.     Time 4   Period Weeks   Status New     PT LONG TERM GOAL #3   Title Pt will improve R cervical rotation to at least 50 deg and L cervical rotation to at least 65 deg in order to indicate improved ability to complete ADLs, work activities and leisure activities.     Time 4   Period Weeks   Status New     PT LONG TERM GOAL #4   Title Pt will report no more than 2/10 neck pain with R and L cervical rotation in order to indicate that pain is decreased limiting factor in mobility.     Time 4   Period Weeks   Status New               Plan - 09/13/16 2013    Clinical Impression Statement Skilled session focused on assessment of HEP with PT making modifications to decrease amount pt doing at home for improved compliance.  Continue to address neck and shoulder pain and tightness with manual therapy, soft tissue mobilization, and scapular strengthening.  Pt continues to report improved pain following session and will be ready for D/C on next visit.     Rehab Potential Excellent   Clinical Impairments Affecting Rehab Potential decreased ability to attend therapy-wants self program for home   PT Frequency 1x / week   PT Duration 4 weeks  POC is for 8 weeks due to planned trips   PT Treatment/Interventions ADLs/Self Care Home Management;Electrical Stimulation;Moist Heat;Traction;Ultrasound;Therapeutic activities;Therapeutic exercise;Neuromuscular re-education;Patient/family education;Manual techniques;Passive range of motion   PT Next Visit Plan continue to work on scapular strengthening, ? NDI as it has not been completed for baseline   PT Home Exercise Plan provided with self mobilization and post deltoid stretch, see pt instruction 07/29/16   Consulted and Agree with Plan of Care Patient      Patient will benefit from skilled therapeutic intervention in order to improve the following deficits and impairments:  Decreased mobility,  Decreased range of motion, Hypomobility, Impaired perceived functional ability, Impaired flexibility, Improper body mechanics, Postural dysfunction, Impaired sensation, Impaired UE functional use  Visit Diagnosis: Cervicalgia  Abnormal posture     Problem List Patient Active Problem List   Diagnosis Date Noted  . Hyperlipidemia 05/14/2014  . Inguinal  hernia bilateral, non-recurrent 06/14/2011    Donnamarie Shankles, Betha Loa 09/13/2016, 8:16 PM  [facility] [address] [place], [place], [address] Phone: [phone]   Fax:  [phone]  Name: Alec Snyder MRN: [id number] Date of Birth: 05/20/1941

## 2016-09-20 ENCOUNTER — Ambulatory Visit: Payer: Commercial Managed Care - PPO | Admitting: Rehabilitation

## 2016-09-20 ENCOUNTER — Encounter: Payer: Self-pay | Admitting: Rehabilitation

## 2016-09-20 DIAGNOSIS — M542 Cervicalgia: Secondary | ICD-10-CM | POA: Diagnosis not present

## 2016-09-20 DIAGNOSIS — R293 Abnormal posture: Secondary | ICD-10-CM

## 2016-09-20 NOTE — Patient Instructions (Signed)
Hip Extension    Lie on back, legs in air, knees bent. Grasp hands behind left thigh and cross right leg over same thigh. Hold _60__ seconds. Repeat __1-2__ times. Do _2-3___ sessions per day.  Hip Stretch    Put right ankle over left knee. Let right knee fall downward, but keep ankle in place. Feel the stretch in hip. May push down gently with hand to feel stretch. Hold __60__ seconds while counting out loud. Repeat with other leg. Repeat __1-2__ times. Do _2-3___ sessions per day.  http://gt2.exer.us/497   Copyright  VHI. All rights reserved.    Copyright  VHI. All rights reserved.

## 2016-09-20 NOTE — Therapy (Signed)
[facility] [address] [place], [place], [address] Phone: [phone]   Fax:  [phone]  Physical Therapy Treatment and D/C Summary  Patient Details  Name: Alec Snyder MRN: [id number] Date of Birth: [birth year] Referring Provider: Andrey Spearman, MD  Encounter Date: 09/20/2016      PT End of Session - 09/20/16 1409    Visit Number 5   Number of Visits 5  including eval   Date for PT Re-Evaluation 09/27/16  did 8 wk re-eval because going out of town for 3 weeks   Authorization Type UMR, UHC PPO   PT Start Time 1402   PT Stop Time 1445   PT Time Calculation (min) 43 min   Activity Tolerance Patient tolerated treatment well   Behavior During Therapy [facility] for tasks assessed/performed      Past Medical History:  Diagnosis Date  . Cancer ([place])    melanoma  . Hemorrhoids    occasional  . Hyperlipemia   . Hypothyroid   . Inguinal hernia    bilateral  . Joint pain    minor  . Wears glasses     Past Surgical History:  Procedure Laterality Date  . BLEPHAROPLASTY Bilateral 2016  . HERNIA REPAIR  2012   BIH  . MELANOMA EXCISION      There were no vitals filed for this visit.      Subjective Assessment - 09/20/16 1405    Subjective Reports that he has been doing exercises more this week.    Limitations Walking   Patient Stated Goals "I want to get some exercises so that I can continue my "self therapy" and continue to exercise and be active."    Currently in Pain? Yes   Pain Score 2    Pain Location Chest   Pain Orientation Left   Pain Descriptors / Indicators Tightness   Pain Type Acute pain   Pain Radiating Towards pectoral region on L    Pain Onset More than a month ago   Pain Frequency Intermittent            TE:  Self thoracic mobilization and pectoral stretch while lying on half foam roller with arms in "T" x 2 mins and then in "Y" for 2 mins.  Tolerated stretch well with  education on how this is self mobilization as well.  Then performed quadruped alternating UEs/LE x 10 reps each.  Pt reporting he does this exercise at home in prone position and demonstrated appropriate technique x 10 reps.  Provided cues on how to place wash cloth in palm to decrease stretch/pain in hands as well as placing small towel roll at forehead to prevent abnormal cervical alignment.  Pt still with old sciatica symptoms therefore provided pt with two piriformis stretches that can be performed in sitting and/or supine.      Self Care:  Education on how to perform self thoracic mobilization at home with rolled towels or purchasing foam roller.  Mentioned that height of foam roller may cause decreased ability to raise UEs, but to begin where able and increase ROM as able.  Continue to educate on posture with all mobility as well as when performing exercises, getting back onto treadmill.  Provided education on transverse myelitis and dermatomes and how they can be affected.  Pt verbalized understanding.  Went over results of NDI and improvements made in self reported pain and limitations.  PT Education - 09/21/16 1408    Education provided Yes   Education Details education on postural awareness with all mobility.    Person(s) Educated Patient   Methods Explanation   Comprehension Verbalized understanding          PT Short Term Goals - 07/29/16 1221      PT SHORT TERM GOAL #1   Title =LTG's           PT Long Term Goals - 09-21-16 1410      PT LONG TERM GOAL #1   Title Pt will be independent with HEP in order to indicate improved functional mobility and decreased pain.  (Target Date: 09/23/16 or by 5th visit)   Baseline 2016/09/21   Time 4   Period Weeks   Status Achieved     PT LONG TERM GOAL #2   Title Pt will improve NDI score by 20% from baseline in order to indicate improved perceived pain related to neck.     Baseline Pt improved from  baseline of 44% impaired to only 4% impaired   Time 4   Period Weeks   Status Achieved     PT LONG TERM GOAL #3   Title Pt will improve R cervical rotation to at least 50 deg and L cervical rotation to at least 65 deg in order to indicate improved ability to complete ADLs, work activities and leisure activities.     Baseline 65 deg L and 60 deg to the R   Time 4   Period Weeks   Status Achieved     PT LONG TERM GOAL #4   Title Pt will report no more than 2/10 neck pain with R and L cervical rotation in order to indicate that pain is decreased limiting factor in mobility.     Baseline met 09-21-16   Time 4   Period Weeks   Status Achieved               Plan - 09/21/16 1409    Clinical Impression Statement Skilled session focused on assessing goals for D/C.  Pt has met 4/4 LTGs and is ready for D/C.  Briefly went over more core and scapular stabilization exercises during session.     Rehab Potential Excellent   Clinical Impairments Affecting Rehab Potential decreased ability to attend therapy-wants self program for home   PT Frequency 1x / week   PT Duration 4 weeks  POC is for 8 weeks due to planned trips   PT Treatment/Interventions ADLs/Self Care Home Management;Electrical Stimulation;Moist Heat;Traction;Ultrasound;Therapeutic activities;Therapeutic exercise;Neuromuscular re-education;Patient/family education;Manual techniques;Passive range of motion   PT Next Visit Plan n/a   PT Home Exercise Plan provided with self mobilization and post deltoid stretch, see pt instruction 07/29/16   Consulted and Agree with Plan of Care Patient      Patient will benefit from skilled therapeutic intervention in order to improve the following deficits and impairments:  Decreased mobility, Decreased range of motion, Hypomobility, Impaired perceived functional ability, Impaired flexibility, Improper body mechanics, Postural dysfunction, Impaired sensation, Impaired UE functional use  Visit  Diagnosis: Cervicalgia  Abnormal posture       G-Codes - 21-Sep-2016 2029    Functional Assessment Tool Used Cervical rotation L 65 deg, 60 R, no pain   Functional Limitation Changing and maintaining body position   Changing and Maintaining Body Position Current Status ([id number]) At least 1 percent but less than 20 percent impaired, limited or restricted   Changing and Maintaining Body Position  Goal Status [phone]) At least 1 percent but less than 20 percent impaired, limited or restricted   Changing and Maintaining Body Position Discharge Status [phone]) At least 1 percent but less than 20 percent impaired, limited or restricted      PHYSICAL THERAPY DISCHARGE SUMMARY  Visits from Start of Care: 5  Current functional level related to goals / functional outcomes: See LTGs   Remaining deficits: Mild tightness in chest, however have provided stretches to address.     Education / Equipment: HEP  Plan: Patient agrees to discharge.  Patient goals were met. Patient is being discharged due to meeting the stated rehab goals.  ?????        Problem List Patient Active Problem List   Diagnosis Date Noted  . Hyperlipidemia 05/14/2014  . Inguinal hernia bilateral, non-recurrent 06/14/2011    Lotta [place]en[place], Betha Loa 09/20/2016, 8:32 PM  [facility] [address] [place], [place], [address] Phone: [phone]   Fax:  [phone]  Name: Alec Snyder MRN: [id number] Date of Birth: 1941-11-22

## 2016-10-19 ENCOUNTER — Ambulatory Visit (INDEPENDENT_AMBULATORY_CARE_PROVIDER_SITE_OTHER): Payer: Commercial Managed Care - PPO | Admitting: Diagnostic Neuroimaging

## 2016-10-19 ENCOUNTER — Encounter: Payer: Self-pay | Admitting: Diagnostic Neuroimaging

## 2016-10-19 VITALS — BP 155/83 | HR 60 | Wt 166.6 lb

## 2016-10-19 DIAGNOSIS — G373 Acute transverse myelitis in demyelinating disease of central nervous system: Secondary | ICD-10-CM

## 2016-10-19 DIAGNOSIS — R200 Anesthesia of skin: Secondary | ICD-10-CM | POA: Diagnosis not present

## 2016-10-19 DIAGNOSIS — M542 Cervicalgia: Secondary | ICD-10-CM

## 2016-10-19 NOTE — Progress Notes (Signed)
[facility]  PATIENT: Alec Snyder DOB: February 09, 1941  REFERRING CLINICIAN: [name] HISTORY FROM: patient  REASON FOR VISIT: follow up   HISTORICAL  CHIEF COMPLAINT:  Chief Complaint  Patient presents with  . Transverse myelitis    rm 75, "second opinion at [facility] [place]"  . Follow-up    2 months    HISTORY OF PRESENT ILLNESS:   UPDATE 10/19/16: Since last visit, doing well. Pain is improved. Had second opinion at [facility], who agreed that patient has a form of transverse myelitis. Additional lab workup was sent, including NMO ab, hypercoagulable workup, autoimmune panel, copper metabolism disorders, viral and other workup.   UPDATE 08/18/16: Since last visit, sxs are stable to slightly worsening. Has been treated with IV solumedrol x 3 days, but no benefit. Still with intermittent aching / sharp pain in left upper back, neck and thighs related to certain movements. Main problem is weakness rather than pain, but are having both.   UPDATE 07/08/16: Since last visit, sxs are stable. Still with neck and shoulder pain. Now right arm is hurting more. Was able to play in golf tournament with son. Test results reviewed.  UPDATE 06/21/16: Since last visit, overall having more neck pain. No change in sxs with prednisone pack. More chest stiffness. Balance, bowel, bladder function normal.  Left shoulder numbness has improved.  PRIOR HPI (06/02/16): 75 year old right-handed male here for evaluation of left upper extremity abnormal sensation, left axillary abnormal sensation, and abnormal MRI cervical spine. March 2017, patient noted abnormal neck sensation, when looking to the left and taking a golf swing. Around April 2017 patient noticed new onset of hurting and burning sensation in his left armpit/axillary region and into his left chest. Patient initially thought he had rubbed onto poison ivy or possibly was developing shingles. Symptoms transitioned over the next few days into a  numbness and tingling sensation. Patient also felt some heaviness in his left arm. Patient was still able to perform his day-to-day activities including playing golf. Patient went to his work wellness clinic ([name]) for evaluation of of symptoms in May 2017, had cervical spine x-ray which showed advanced cervical spondylosis. Due to persistent symptoms patient followed up with PCP ([name]) in June 2017 for annual physical and then MRI of the cervical spine was ordered. On 06/01/2016, abnormal signal was noted within the spinal cord centrally at C6 and towards the left posterior lateral column extending inferiorly into the thoracic spinal cord. Follow-up postcontrast imaging demonstrated abnormal enhancement in the left posterior column. Patient then referred to me for urgent evaluation. Today patient reports continued abnormal sensation in the left armpit, left chest, left posterior scapular region. Overall symptoms have been stable without worsening. Patient denies any prodromal infections, vaccinations, traumas or change in medications. No problems with right arm, bilateral legs, balance or walking. No headaches or vision changes.   REVIEW OF SYSTEMS: Full 14 system review of systems performed and negative with exception of: aching muscles.  ALLERGIES: No Known Allergies  HOME MEDICATIONS: Outpatient Medications Prior to Visit  Medication Sig Dispense Refill  . aspirin 81 MG tablet Take 81 mg by mouth daily.      . Cyanocobalamin (VITAMIN B-12) 5000 MCG SUBL Place under the tongue.    Marland Kitchen levothyroxine (SYNTHROID, LEVOTHROID) 125 MCG tablet Take 125 mcg by mouth daily.      . rosuvastatin (CRESTOR) 20 MG tablet Take 20 mg by mouth daily.    . cyclobenzaprine (FLEXERIL) 10 MG tablet  Take 1 tablet (10 mg total) by mouth 3 (three) times daily as needed for muscle spasms. (Patient not taking: Reported on 08/23/2016) 90 tablet 3  . gabapentin (NEURONTIN) 300 MG capsule Take 1-2 capsules (300-600 mg  total) by mouth 3 (three) times daily. (Patient not taking: Reported on 08/23/2016) 180 capsule 6   No facility-administered medications prior to visit.     PAST MEDICAL HISTORY: Past Medical History:  Diagnosis Date  . Cancer ([place])    melanoma  . Hemorrhoids    occasional  . Hyperlipemia   . Hypothyroid   . Inguinal hernia    bilateral  . Joint pain    minor  . Wears glasses     PAST SURGICAL HISTORY: Past Surgical History:  Procedure Laterality Date  . BLEPHAROPLASTY Bilateral 2016  . HERNIA REPAIR  2012   BIH  . MELANOMA EXCISION      FAMILY HISTORY: Family History  Problem Relation Age of Onset  . Kidney failure Father   . Diabetes Mother   . Arthritis Brother   . Arthritis Sister     SOCIAL HISTORY:  Social History   Social History  . Marital status: Married    Spouse name: [name]  . Number of children: 2  . Years of education: 16   Occupational History  .      retired, Airline pilot   Social History Main Topics  . Smoking status: Former Smoker    Packs/day: 1.50    Years: 3.00    Types: Cigarettes    Quit date: 05/14/1974  . Smokeless tobacco: Never Used  . Alcohol use 1.2 oz/week    2 Standard drinks or equivalent per week     Comment: occas 2-4 beers  weekly  . Drug use: No  . Sexual activity: Not on file   Other Topics Concern  . Not on file   Social History Narrative   Lives with spouse   Caffeine use- coffee  3-4 cups daily     PHYSICAL EXAM  GENERAL EXAM/CONSTITUTIONAL: Vitals:  Vitals:   10/19/16 0944  BP: (!) 155/83  Pulse: 60  Weight: 166 lb 9.6 oz (75.6 kg)   Body mass index is 26.09 kg/m. No exam data present  Patient is in no distress; well developed, nourished and groomed; neck is supple  CARDIOVASCULAR:  Examination of carotid arteries is normal; no carotid bruits  Regular rate and rhythm, no murmurs  Examination of peripheral vascular system by observation and palpation is  normal  EYES:  Ophthalmoscopic exam of optic discs and posterior segments is normal; no papilledema or hemorrhages  MUSCULOSKELETAL:  Gait, strength, tone, movements noted in Neurologic exam below  NEUROLOGIC: MENTAL STATUS:  No flowsheet data found.  awake, alert, oriented to person, place and time  recent and remote memory intact  normal attention and concentration  language fluent, comprehension intact, naming intact,   fund of knowledge appropriate  CRANIAL NERVE:   2nd - no papilledema on fundoscopic exam  2nd, 3rd, 4th, 6th - pupils equal and reactive to light, visual fields full to confrontation, extraocular muscles intact, no nystagmus  5th - facial sensation symmetric  7th - facial strength symmetric  8th - hearing intact  9th - palate elevates symmetrically, uvula midline  11th - shoulder shrug symmetric  12th - tongue protrusion midline  MOTOR:   normal bulk and tone, full strength in the BUE, BLE  SENSORY:   normal and symmetric to light touch  COORDINATION:  finger-nose-finger, fine finger movements normal  REFLEXES:   deep tendon reflexes present and symmetric  GAIT/STATION:   narrow based gait; SLIGHT LIMP IN LEFT LEG    DIAGNOSTIC DATA (LABS, IMAGING, TESTING) - I reviewed patient records, labs, notes, testing and imaging myself where available.  Lab Results  Component Value Date   WBC 9.4 06/21/2016   HGB 14.3 07/07/2011   HCT 40.2 06/21/2016   MCV 91 06/21/2016   PLT 333 06/21/2016      Component Value Date/Time   NA 142 06/21/2016 1311   K 4.4 06/21/2016 1311   CL 98 06/21/2016 1311   CO2 24 06/21/2016 1311   GLUCOSE 88 06/21/2016 1311   GLUCOSE 108 (H) 07/07/2011 0956   BUN 15 06/21/2016 1311   CREATININE 0.93 06/21/2016 1311   CALCIUM 9.7 06/21/2016 1311   PROT 7.0 06/21/2016 1311   ALBUMIN 4.2 06/21/2016 1311   AST 19 06/21/2016 1311   ALT 23 06/21/2016 1311   ALKPHOS 116 06/21/2016 1311   BILITOT 0.6  06/21/2016 1311   GFRNONAA 80 06/21/2016 1311   GFRAA 93 06/21/2016 1311   No results found for: CHOL, HDL, LDLCALC, LDLDIRECT, TRIG, CHOLHDL Lab Results  Component Value Date   HGBA1C 6.1 (H) 06/21/2016   Lab Results  Component Value Date   VITAMINB12 >2000 (H) 08/18/2016   Lab Results  Component Value Date   TSH 1.430 06/21/2016    06/01/16 MRI cervical spine [I reviewed images myself and agree with interpretation. Other considerations would include spinal cord vascular malformation, such as dural AV fistula or other AVM. -VRP]  - Abnormal spinal cord beginning at C6 and extending into the upper thoracic spine. There is hyperintense signal in the central cord and in the posterior column on the left. The posterior column shows intense enhancement . Cord is not enlarged. - This may represent an area of healing transverse myelitis. Neoplasm appears unlikely given the multi segment extent of the abnormality and lack of cord enlargement. Cord infarct is a consideration. B12 deficiency typically is higher in the cervical cord and involves both posterior columns.  06/03/16 MRI brain  - Normal for age MRI appearance of the brain.  06/03/16 MRI thoracic spine  - The bulk of the abnormal spinal cord lesion was imaged on the recent cervical exam. There is abnormal T2 signal within the cord from C5-C6 to T5-T6. There is abnormal dorsal spinal cord enhancement from C6-C7 to T3-T4. There appears to be a small syrinx at those levels associated with the abnormality. 3. The spinal cord below C6 is normal. No associated osseous or paraspinal abnormality. No associated spinal cord vasculature is evident. - Top differential considerations include: Primary spinal cord tumor, lymphoma or metastatic disease to the cord, and less likely an inflammatory process (such as sarcoidosis). Consider follow-up CT chest abdomen and pelvis (oral and IV contrast preferred) to evaluate for any non CNS primary tumor site. CSF  analysis may also be valuable.  07/22/16 MRI of the cervical spine with and without contrast shows the following: 1.     Abnormal signal within the central and left posterior spinal cord from C5-6 caudally into the upper thoracic spine. There is enhancement of the left posterior column from C6 to the upper thoracic spine.   The spinal cord is not enlarged. When compared to the study dated 06/01/2016, there is no definite interval change. The etiology is uncertain, this could represent a resolving transverse myelitis. Neoplasm  is also possible, though less  likely with the stability over time. B12 or copper deficiency can lead to changes within the posterior columns though the finding is usually bilateral. 2.    Multilevel degenerative changes as detailed above that lead to some encroachment upon the exiting right C5 and C7 nerve roots but no definite nerve root compression. This appears stable when compared to the prior study.  07/22/16 MRI of the thoracic spine with and without contrast shows the following: 1.    Abnormal signal centrally within the spinal cord extending from C5-C6 to T5-T6.   There is also abnormal signal on T2-weighted images with post contrast enhancement involving the left posterior column from C6-C7 to T3-T4. The overall appearance is unchanged when compared to the 06/03/2016 MRI.   The etiology is uncertain. The enhancing component could be due to a resolving transverse myelitis or to a primary neoplasm. Stability since the prior study be less likely with metastatic disease or lymphoma. An inflammatory process such as sarcoidosis is also in the differential diagnosis. Additionally, part of the nonenhancing changes could represent a syrinx..      2.    There are no significant degenerative changes and the lower thoracic spinal cord appears normal.  06/25/16 CT chest / abd / pelvis 1. No evidence of primary malignancy or metastatic disease in the chest, abdomen or pelvis. 2. Minimal  colonic diverticulosis. 3. Dense coronary artery atheromatous calcifications. 4. Aortic atherosclerosis. 5. Degenerative right sacroiliitis.  06/21/16 Lab testing: ANA, ANCA, HEPATITIS, HIV, RPR, B12, FOLATE, ACE - all negative / normal  06/28/16 CSF studies --> WBC 11, RBC 11, glucose 70, protein 50, VDRL non-reactive, cultures negative, cytology negative, OCB (2 well defined gamma restriction bands that are also present in the patient's corresponding serum sample, but some bands in the CSF are more prominent)     ASSESSMENT AND PLAN  75 y.o. year old male here with new onset burning, tingling, numbness sensation in left axillary region and left scapular region, with subjective muscle weakness and left upper extremity. Patient found to have unusual abnormal MRI cervical spine with longitudinally extensive spinal cord lesion centrally and within the left posterior column region extending from C6 level down to visualized upper thoracic spinal cord region. Would favor inflammatory, autoimmune, vascular or neoplastic etiologies.  Ddx: transverse myelitis (due to spinal cord inflammatory/autoimmune, vascular, neoplastic etiologies)  1. Transverse myelitis ([place])   2. Numbness   3. Neck pain      PLAN: I spent 25 minutes of face to face time with patient. Greater than 50% of time was spent in counseling and coordination of care with patient. In summary we discussed:  - use ibuprofen 400mg  twice a day as needed - follow up second opinion labs and report from [place] - may consider repeat MRIs in Feb 2017 - continue therapy exercises  Return in about 3 months (around 01/19/2017).    Penni Bombard, MD [id number], [id number] AM Certified in Neurology, Neurophysiology and Neuroimaging  [facility] [address], [place], [place] [address] [phone]

## 2016-10-25 ENCOUNTER — Encounter: Payer: Self-pay | Admitting: Diagnostic Neuroimaging

## 2016-11-10 ENCOUNTER — Encounter: Payer: Self-pay | Admitting: Diagnostic Neuroimaging

## 2016-11-10 ENCOUNTER — Telehealth: Payer: Self-pay | Admitting: Diagnostic Neuroimaging

## 2016-11-10 NOTE — Telephone Encounter (Signed)
I spoke with patient and reviewed plan as I discussed with Dr. Bretta Bang (via phone earlier today).   Anecdotally Dr. Bretta Bang ([facility]) has treated 10 patients with progressive, steroid nonresponsive transverse myelitis patient's with persistent enhancement on MRI, with 6-12 months of pulsed IV cyclophosphamide (1000 mg monthly). For 8 out of 10 patients, patient's symptoms an MRI enhancement improved. 2 of the patient's fail to improve with cyclophosphamide. For these patients they were treated with infliximab course and then symptoms an MRI scans improve.  Today patient reports that his clinical course is stable to slightly improving. He still has some mild numbness and pulling sensation in his left shoulder blade. He is able to sleep better. He does not have any weakness or gait difficulty. No bowel or bladder incontinence. Overall slight improvement is noted.  I reviewed risks and benefits of IV cyclophosphamide. At this point patient would like to hold off on therapy and monitor symptoms. I agree with the plan. He will see me back in clinic in favor 2018. If his clinical course deteriorates sooner than then, we may reconsider our course of action and likely will treat with IV steroids, followed by possible IV cyclophosphamide. As I do not have any experience personally treating any patients with IV cyclophosphamide, nor to my colleagues in this practice, I may reach out to nearby subspecialists for their expertise in managing this condition and this medication. In addition Dr. Bretta Bang ([facility]) will be emailing me his protocol for IV cyclophosphamide in transverse myelitis.  Penni Bombard, MD [id number], [id number] PM Certified in Neurology, Neurophysiology and Neuroimaging  [facility] [address] [place], [place] [address] [phone]

## 2017-01-21 ENCOUNTER — Encounter: Payer: Self-pay | Admitting: Diagnostic Neuroimaging

## 2017-01-21 ENCOUNTER — Ambulatory Visit (INDEPENDENT_AMBULATORY_CARE_PROVIDER_SITE_OTHER): Payer: Commercial Managed Care - PPO | Admitting: Diagnostic Neuroimaging

## 2017-01-21 VITALS — BP 168/79 | HR 64 | Wt 163.2 lb

## 2017-01-21 DIAGNOSIS — M25649 Stiffness of unspecified hand, not elsewhere classified: Secondary | ICD-10-CM | POA: Diagnosis not present

## 2017-01-21 DIAGNOSIS — R200 Anesthesia of skin: Secondary | ICD-10-CM

## 2017-01-21 DIAGNOSIS — G373 Acute transverse myelitis in demyelinating disease of central nervous system: Secondary | ICD-10-CM

## 2017-01-21 DIAGNOSIS — M542 Cervicalgia: Secondary | ICD-10-CM

## 2017-01-21 NOTE — Progress Notes (Signed)
[facility]  PATIENT: Jaithan Morlan DOB: October 10, 1941  REFERRING CLINICIAN: [name] HISTORY FROM: patient  REASON FOR VISIT: follow up   HISTORICAL  CHIEF COMPLAINT:  Chief Complaint  Patient presents with  . Transverse myelitis    rm 7, "no change"  . Follow-up    3 month    HISTORY OF PRESENT ILLNESS:   UPDATE 01/21/17: Since last doing well. Went to [place] and played 10 days of golf, and did well. Neck range of motion is better. No gait issues. Strength is good.   UPDATE 10/19/16: Since last visit, doing well. Pain is improved. Had second opinion at [facility], who agreed that patient has a form of transverse myelitis. Additional lab workup was sent, including NMO ab, hypercoagulable workup, autoimmune panel, copper metabolism disorders, viral and other workup.   UPDATE 08/18/16: Since last visit, sxs are stable to slightly worsening. Has been treated with IV solumedrol x 3 days, but no benefit. Still with intermittent aching / sharp pain in left upper back, neck and thighs related to certain movements. Main problem is weakness rather than pain, but are having both.   UPDATE 07/08/16: Since last visit, sxs are stable. Still with neck and shoulder pain. Now right arm is hurting more. Was able to play in golf tournament with son. Test results reviewed.  UPDATE 06/21/16: Since last visit, overall having more neck pain. No change in sxs with prednisone pack. More chest stiffness. Balance, bowel, bladder function normal.  Left shoulder numbness has improved.  PRIOR HPI (06/02/16): 76 year old right-handed male here for evaluation of left upper extremity abnormal sensation, left axillary abnormal sensation, and abnormal MRI cervical spine. March 2017, patient noted abnormal neck sensation, when looking to the left and taking a golf swing. Around April 2017 patient noticed new onset of hurting and burning sensation in his left armpit/axillary region and into his left  chest. Patient initially thought he had rubbed onto poison ivy or possibly was developing shingles. Symptoms transitioned over the next few days into a numbness and tingling sensation. Patient also felt some heaviness in his left arm. Patient was still able to perform his day-to-day activities including playing golf. Patient went to his work wellness clinic ([name]) for evaluation of of symptoms in May 2017, had cervical spine x-ray which showed advanced cervical spondylosis. Due to persistent symptoms patient followed up with PCP ([name]) in June 2017 for annual physical and then MRI of the cervical spine was ordered. On 06/01/2016, abnormal signal was noted within the spinal cord centrally at C6 and towards the left posterior lateral column extending inferiorly into the thoracic spinal cord. Follow-up postcontrast imaging demonstrated abnormal enhancement in the left posterior column. Patient then referred to me for urgent evaluation. Today patient reports continued abnormal sensation in the left armpit, left chest, left posterior scapular region. Overall symptoms have been stable without worsening. Patient denies any prodromal infections, vaccinations, traumas or change in medications. No problems with right arm, bilateral legs, balance or walking. No headaches or vision changes.   REVIEW OF SYSTEMS: Full 14 system review of systems performed and negative with exception of: only as per HPI. No bowel or bladder issues.   ALLERGIES: No Known Allergies  HOME MEDICATIONS: Outpatient Medications Prior to Visit  Medication Sig Dispense Refill  . aspirin 81 MG tablet Take 81 mg by mouth daily.      . Cyanocobalamin (VITAMIN B-12) 5000 MCG SUBL Place under the tongue.    Marland Kitchen ibuprofen (  ADVIL,MOTRIN) 200 MG tablet Take 200 mg by mouth every 6 (six) hours as needed.    Marland Kitchen levothyroxine (SYNTHROID, LEVOTHROID) 125 MCG tablet Take 125 mcg by mouth daily.      . pantoprazole (PROTONIX) 40 MG tablet Take 40  mg by mouth 2 (two) times daily.  1  . rosuvastatin (CRESTOR) 20 MG tablet Take 20 mg by mouth daily.     No facility-administered medications prior to visit.     PAST MEDICAL HISTORY: Past Medical History:  Diagnosis Date  . Cancer ([place])    melanoma  . Hemorrhoids    occasional  . Hyperlipemia   . Hypothyroid   . Inguinal hernia    bilateral  . Joint pain    minor  . Wears glasses     PAST SURGICAL HISTORY: Past Surgical History:  Procedure Laterality Date  . BLEPHAROPLASTY Bilateral 2016  . HERNIA REPAIR  2012   BIH  . MELANOMA EXCISION      FAMILY HISTORY: Family History  Problem Relation Age of Onset  . Kidney failure Father   . Diabetes Mother   . Arthritis Brother   . Arthritis Sister     SOCIAL HISTORY:  Social History   Social History  . Marital status: Married    Spouse name: [name]  . Number of children: 2  . Years of education: 16   Occupational History  .      retired, Airline pilot   Social History Main Topics  . Smoking status: Former Smoker    Packs/day: 1.50    Years: 3.00    Types: Cigarettes    Quit date: 05/14/1974  . Smokeless tobacco: Never Used  . Alcohol use 1.2 oz/week    2 Standard drinks or equivalent per week     Comment: occas 2-4 beers  weekly  . Drug use: No  . Sexual activity: Not on file   Other Topics Concern  . Not on file   Social History Narrative   Lives with spouse   Caffeine use- coffee  3-4 cups daily     PHYSICAL EXAM  GENERAL EXAM/CONSTITUTIONAL: Vitals:  Vitals:   01/21/17 0759  BP: (!) 168/79  Pulse: 64  Weight: 163 lb 3.2 oz (74 kg)   Body mass index is 25.56 kg/m. No exam data present  Patient is in no distress; well developed, nourished and groomed; neck is supple  CARDIOVASCULAR:  Examination of carotid arteries is normal; no carotid bruits  Regular rate and rhythm, no murmurs  Examination of peripheral vascular system by observation and palpation is  normal  EYES:  Ophthalmoscopic exam of optic discs and posterior segments is normal; no papilledema or hemorrhages  MUSCULOSKELETAL:  Gait, strength, tone, movements noted in Neurologic exam below  NEUROLOGIC: MENTAL STATUS:  No flowsheet data found.  awake, alert, oriented to person, place and time  recent and remote memory intact  normal attention and concentration  language fluent, comprehension intact, naming intact,   fund of knowledge appropriate  CRANIAL NERVE:   2nd - no papilledema on fundoscopic exam  2nd, 3rd, 4th, 6th - pupils equal and reactive to light, visual fields full to confrontation, extraocular muscles intact, no nystagmus  5th - facial sensation symmetric  7th - facial strength symmetric  8th - hearing intact  9th - palate elevates symmetrically, uvula midline  11th - shoulder shrug symmetric  12th - tongue protrusion midline  MOTOR:   normal bulk and tone, full strength in the BUE,  BLE  SENSORY:   normal and symmetric to light touch  COORDINATION:   finger-nose-finger, fine finger movements normal  REFLEXES:   deep tendon reflexes 1+ and symmetric  GAIT/STATION:   narrow based gait    DIAGNOSTIC DATA (LABS, IMAGING, TESTING) - I reviewed patient records, labs, notes, testing and imaging myself where available.  Lab Results  Component Value Date   WBC 9.4 06/21/2016   HGB 14.3 07/07/2011   HCT 40.2 06/21/2016   MCV 91 06/21/2016   PLT 333 06/21/2016      Component Value Date/Time   NA 142 06/21/2016 1311   K 4.4 06/21/2016 1311   CL 98 06/21/2016 1311   CO2 24 06/21/2016 1311   GLUCOSE 88 06/21/2016 1311   GLUCOSE 108 (H) 07/07/2011 0956   BUN 15 06/21/2016 1311   CREATININE 0.93 06/21/2016 1311   CALCIUM 9.7 06/21/2016 1311   PROT 7.0 06/21/2016 1311   ALBUMIN 4.2 06/21/2016 1311   AST 19 06/21/2016 1311   ALT 23 06/21/2016 1311   ALKPHOS 116 06/21/2016 1311   BILITOT 0.6 06/21/2016 1311   GFRNONAA 80  06/21/2016 1311   GFRAA 93 06/21/2016 1311   No results found for: CHOL, HDL, LDLCALC, LDLDIRECT, TRIG, CHOLHDL Lab Results  Component Value Date   HGBA1C 6.1 (H) 06/21/2016   Lab Results  Component Value Date   VITAMINB12 >2000 (H) 08/18/2016   Lab Results  Component Value Date   TSH 1.430 06/21/2016    06/01/16 MRI cervical spine [I reviewed images myself and agree with interpretation. Other considerations would include spinal cord vascular malformation, such as dural AV fistula or other AVM. -VRP]  - Abnormal spinal cord beginning at C6 and extending into the upper thoracic spine. There is hyperintense signal in the central cord and in the posterior column on the left. The posterior column shows intense enhancement . Cord is not enlarged. - This may represent an area of healing transverse myelitis. Neoplasm appears unlikely given the multi segment extent of the abnormality and lack of cord enlargement. Cord infarct is a consideration. B12 deficiency typically is higher in the cervical cord and involves both posterior columns.  06/03/16 MRI brain  - Normal for age MRI appearance of the brain.  06/03/16 MRI thoracic spine  - The bulk of the abnormal spinal cord lesion was imaged on the recent cervical exam. There is abnormal T2 signal within the cord from C5-C6 to T5-T6. There is abnormal dorsal spinal cord enhancement from C6-C7 to T3-T4. There appears to be a small syrinx at those levels associated with the abnormality. 3. The spinal cord below C6 is normal. No associated osseous or paraspinal abnormality. No associated spinal cord vasculature is evident. - Top differential considerations include: Primary spinal cord tumor, lymphoma or metastatic disease to the cord, and less likely an inflammatory process (such as sarcoidosis). Consider follow-up CT chest abdomen and pelvis (oral and IV contrast preferred) to evaluate for any non CNS primary tumor site. CSF analysis may also be  valuable.  07/22/16 MRI of the cervical spine with and without contrast shows the following: 1.     Abnormal signal within the central and left posterior spinal cord from C5-6 caudally into the upper thoracic spine. There is enhancement of the left posterior column from C6 to the upper thoracic spine.   The spinal cord is not enlarged. When compared to the study dated 06/01/2016, there is no definite interval change. The etiology is uncertain, this could represent a  resolving transverse myelitis. Neoplasm  is also possible, though less likely with the stability over time. B12 or copper deficiency can lead to changes within the posterior columns though the finding is usually bilateral. 2.    Multilevel degenerative changes as detailed above that lead to some encroachment upon the exiting right C5 and C7 nerve roots but no definite nerve root compression. This appears stable when compared to the prior study.  07/22/16 MRI of the thoracic spine with and without contrast shows the following: 1.    Abnormal signal centrally within the spinal cord extending from C5-C6 to T5-T6.   There is also abnormal signal on T2-weighted images with post contrast enhancement involving the left posterior column from C6-C7 to T3-T4. The overall appearance is unchanged when compared to the 06/03/2016 MRI.   The etiology is uncertain. The enhancing component could be due to a resolving transverse myelitis or to a primary neoplasm. Stability since the prior study be less likely with metastatic disease or lymphoma. An inflammatory process such as sarcoidosis is also in the differential diagnosis. Additionally, part of the nonenhancing changes could represent a syrinx..      2.    There are no significant degenerative changes and the lower thoracic spinal cord appears normal.  06/25/16 CT chest / abd / pelvis 1. No evidence of primary malignancy or metastatic disease in the chest, abdomen or pelvis. 2. Minimal colonic  diverticulosis. 3. Dense coronary artery atheromatous calcifications. 4. Aortic atherosclerosis. 5. Degenerative right sacroiliitis.  06/21/16 Lab testing: ANA, ANCA, HEPATITIS, HIV, RPR, B12, FOLATE, ACE - all negative / normal  06/28/16 CSF studies --> WBC 11, RBC 11, glucose 70, protein 50, VDRL non-reactive, cultures negative, cytology negative, OCB (2 well defined gamma restriction bands that are also present in the patient's corresponding serum sample, but some bands in the CSF are more prominent)     ASSESSMENT AND PLAN  76 y.o. year old male here with new onset burning, tingling, numbness sensation in left axillary region and left scapular region, with subjective muscle weakness and left upper extremity. Patient found to have unusual abnormal MRI cervical spine with longitudinally extensive spinal cord lesion centrally and within the left posterior column region extending from C6 level down to visualized upper thoracic spinal cord region. Would favor inflammatory, autoimmune, vascular or neoplastic etiologies.   Ddx: transverse myelitis (due to spinal cord inflammatory/autoimmune, vascular, neoplastic etiologies)  1. Transverse myelitis ([place])   2. Numbness   3. Neck pain   4. Stiffness of hand joint, unspecified laterality     PLAN: I spent 25 minutes of face to face time with patient. Greater than 50% of time was spent in counseling and coordination of care with patient. In summary we discussed:  - continue ibuprofen 400mg  twice a day as needed - continue diclofenac gel as needed - repeat MRIs in ~ Aug 2018 - continue therapy exercises  Return in about 4 months (around 05/21/2017).    Penni Bombard, MD [id number], [id number] AM Certified in Neurology, Neurophysiology and Neuroimaging  [facility] [address], [place], [place] [address] [phone]

## 2017-05-24 ENCOUNTER — Encounter: Payer: Self-pay | Admitting: Diagnostic Neuroimaging

## 2017-05-24 ENCOUNTER — Ambulatory Visit (INDEPENDENT_AMBULATORY_CARE_PROVIDER_SITE_OTHER): Payer: Commercial Managed Care - PPO | Admitting: Diagnostic Neuroimaging

## 2017-05-24 VITALS — BP 155/88 | HR 68 | Wt 164.8 lb

## 2017-05-24 DIAGNOSIS — R200 Anesthesia of skin: Secondary | ICD-10-CM

## 2017-05-24 DIAGNOSIS — M19031 Primary osteoarthritis, right wrist: Secondary | ICD-10-CM

## 2017-05-24 DIAGNOSIS — M542 Cervicalgia: Secondary | ICD-10-CM

## 2017-05-24 DIAGNOSIS — G373 Acute transverse myelitis in demyelinating disease of central nervous system: Secondary | ICD-10-CM

## 2017-05-24 DIAGNOSIS — M19032 Primary osteoarthritis, left wrist: Secondary | ICD-10-CM

## 2017-05-24 NOTE — Progress Notes (Signed)
[facility]  PATIENT: Alec Snyder DOB: May 08, 1941  REFERRING CLINICIAN: [name] HISTORY FROM: patient  REASON FOR VISIT: follow up   HISTORICAL  CHIEF COMPLAINT:  Chief Complaint  Patient presents with  . Transverse Myelitis    rm 6, "pain in my hands is worse, switched from Advil to Tylenol"  . Follow-up    4 month    HISTORY OF PRESENT ILLNESS:   UPDATE 05/24/17: Since last visit, doing well. No recurrence of TM symptoms. Arthritis eval per rheumatology, negative for RA.  UPDATE 01/21/17: Since last doing well. Went to [place] and played 10 days of golf, and did well. Neck range of motion is better. No gait issues. Strength is good.   UPDATE 10/19/16: Since last visit, doing well. Pain is improved. Had second opinion at [facility], who agreed that patient has a form of transverse myelitis. Additional lab workup was sent, including NMO ab, hypercoagulable workup, autoimmune panel, copper metabolism disorders, viral and other workup.   UPDATE 08/18/16: Since last visit, sxs are stable to slightly worsening. Has been treated with IV solumedrol x 3 days, but no benefit. Still with intermittent aching / sharp pain in left upper back, neck and thighs related to certain movements. Main problem is weakness rather than pain, but are having both.   UPDATE 07/08/16: Since last visit, sxs are stable. Still with neck and shoulder pain. Now right arm is hurting more. Was able to play in golf tournament with son. Test results reviewed.  UPDATE 06/21/16: Since last visit, overall having more neck pain. No change in sxs with prednisone pack. More chest stiffness. Balance, bowel, bladder function normal.  Left shoulder numbness has improved.  PRIOR HPI (06/02/16): 76 year old right-handed male here for evaluation of left upper extremity abnormal sensation, left axillary abnormal sensation, and abnormal MRI cervical spine. March 2017, patient noted abnormal neck sensation, when  looking to the left and taking a golf swing. Around April 2017 patient noticed new onset of hurting and burning sensation in his left armpit/axillary region and into his left chest. Patient initially thought he had rubbed onto poison ivy or possibly was developing shingles. Symptoms transitioned over the next few days into a numbness and tingling sensation. Patient also felt some heaviness in his left arm. Patient was still able to perform his day-to-day activities including playing golf. Patient went to his work wellness clinic ([name]) for evaluation of of symptoms in May 2017, had cervical spine x-ray which showed advanced cervical spondylosis. Due to persistent symptoms patient followed up with PCP ([name]) in June 2017 for annual physical and then MRI of the cervical spine was ordered. On 06/01/2016, abnormal signal was noted within the spinal cord centrally at C6 and towards the left posterior lateral column extending inferiorly into the thoracic spinal cord. Follow-up postcontrast imaging demonstrated abnormal enhancement in the left posterior column. Patient then referred to me for urgent evaluation. Today patient reports continued abnormal sensation in the left armpit, left chest, left posterior scapular region. Overall symptoms have been stable without worsening. Patient denies any prodromal infections, vaccinations, traumas or change in medications. No problems with right arm, bilateral legs, balance or walking. No headaches or vision changes.   REVIEW OF SYSTEMS: Full 14 system review of systems performed and negative with exception of: joint pain neck pain stiffness numbness weakness.    ALLERGIES: No Known Allergies  HOME MEDICATIONS: Outpatient Medications Prior to Visit  Medication Sig Dispense Refill  . aspirin 81 MG  tablet Take 81 mg by mouth daily.      . Cyanocobalamin (VITAMIN B-12) 5000 MCG SUBL Place under the tongue.    Marland Kitchen levothyroxine (SYNTHROID, LEVOTHROID) 125 MCG  tablet Take 125 mcg by mouth daily.      . pantoprazole (PROTONIX) 40 MG tablet Take 40 mg by mouth 2 (two) times daily.  1  . rosuvastatin (CRESTOR) 20 MG tablet Take 20 mg by mouth daily.    . sildenafil (VIAGRA) 100 MG tablet Take 100 mg by mouth daily as needed for erectile dysfunction.    Marland Kitchen ibuprofen (ADVIL,MOTRIN) 200 MG tablet Take 200 mg by mouth every 6 (six) hours as needed.    . diclofenac sodium (VOLTAREN) 1 % GEL For hands prn  1   No facility-administered medications prior to visit.     PAST MEDICAL HISTORY: Past Medical History:  Diagnosis Date  . Cancer ([place])    melanoma  . Hemorrhoids    occasional  . Hyperlipemia   . Hypothyroid   . Inguinal hernia    bilateral  . Joint pain    minor  . Wears glasses     PAST SURGICAL HISTORY: Past Surgical History:  Procedure Laterality Date  . BLEPHAROPLASTY Bilateral 2016  . HERNIA REPAIR  2012   BIH  . MELANOMA EXCISION      FAMILY HISTORY: Family History  Problem Relation Age of Onset  . Kidney failure Father   . Diabetes Mother   . Arthritis Brother   . Arthritis Sister     SOCIAL HISTORY:  Social History   Social History  . Marital status: Married    Spouse name: [name]  . Number of children: 2  . Years of education: 16   Occupational History  .      retired, Airline pilot   Social History Main Topics  . Smoking status: Former Smoker    Packs/day: 1.50    Years: 3.00    Types: Cigarettes    Quit date: 05/14/1974  . Smokeless tobacco: Never Used  . Alcohol use 1.2 oz/week    2 Standard drinks or equivalent per week     Comment: occas 2-4 beers  weekly  . Drug use: No  . Sexual activity: Not on file   Other Topics Concern  . Not on file   Social History Narrative   Lives with spouse   Caffeine use- coffee  3-4 cups daily     PHYSICAL EXAM  GENERAL EXAM/CONSTITUTIONAL: Vitals:  Vitals:   05/24/17 0931  BP: (!) 155/88  Pulse: 68  Weight: 164 lb 12.8 oz (74.8 kg)   Body mass  index is 25.81 kg/m. No exam data present  Patient is in no distress; well developed, nourished and groomed; neck is supple  CARDIOVASCULAR:  Examination of carotid arteries is normal; no carotid bruits  Regular rate and rhythm, no murmurs  Examination of peripheral vascular system by observation and palpation is normal  EYES:  Ophthalmoscopic exam of optic discs and posterior segments is normal; no papilledema or hemorrhages  MUSCULOSKELETAL:  Gait, strength, tone, movements noted in Neurologic exam below  NEUROLOGIC: MENTAL STATUS:  No flowsheet data found.  awake, alert, oriented to person, place and time  recent and remote memory intact  normal attention and concentration  language fluent, comprehension intact, naming intact,   fund of knowledge appropriate  CRANIAL NERVE:   2nd - no papilledema on fundoscopic exam  2nd, 3rd, 4th, 6th - pupils equal and reactive to  light, visual fields full to confrontation, extraocular muscles intact, no nystagmus  5th - facial sensation symmetric  7th - facial strength symmetric  8th - hearing intact  9th - palate elevates symmetrically, uvula midline  11th - shoulder shrug symmetric  12th - tongue protrusion midline  MOTOR:   normal bulk and tone, full strength in the BUE, BLE  SENSORY:   normal and symmetric to light touch  COORDINATION:   finger-nose-finger, fine finger movements normal  REFLEXES:   deep tendon reflexes 1+ and symmetric  GAIT/STATION:   narrow based gait    DIAGNOSTIC DATA (LABS, IMAGING, TESTING) - I reviewed patient records, labs, notes, testing and imaging myself where available.  Lab Results  Component Value Date   WBC 9.4 06/21/2016   HGB 13.4 06/21/2016   HCT 40.2 06/21/2016   MCV 91 06/21/2016   PLT 333 06/21/2016      Component Value Date/Time   NA 142 06/21/2016 1311   K 4.4 06/21/2016 1311   CL 98 06/21/2016 1311   CO2 24 06/21/2016 1311   GLUCOSE 88  06/21/2016 1311   GLUCOSE 108 (H) 07/07/2011 0956   BUN 15 06/21/2016 1311   CREATININE 0.93 06/21/2016 1311   CALCIUM 9.7 06/21/2016 1311   PROT 7.0 06/21/2016 1311   ALBUMIN 4.2 06/21/2016 1311   AST 19 06/21/2016 1311   ALT 23 06/21/2016 1311   ALKPHOS 116 06/21/2016 1311   BILITOT 0.6 06/21/2016 1311   GFRNONAA 80 06/21/2016 1311   GFRAA 93 06/21/2016 1311   No results found for: CHOL, HDL, LDLCALC, LDLDIRECT, TRIG, CHOLHDL Lab Results  Component Value Date   HGBA1C 6.1 (H) 06/21/2016   Lab Results  Component Value Date   VITAMINB12 >2000 (H) 08/18/2016   Lab Results  Component Value Date   TSH 1.430 06/21/2016   No results found for: [facility]    06/01/16 MRI cervical spine [I reviewed images myself and agree with interpretation. Other considerations would include spinal cord vascular malformation, such as dural AV fistula or other AVM. -VRP]  - Abnormal spinal cord beginning at C6 and extending into the upper thoracic spine. There is hyperintense signal in the central cord and in the posterior column on the left. The posterior column shows intense enhancement . Cord is not enlarged. - This may represent an area of healing transverse myelitis. Neoplasm appears unlikely given the multi segment extent of the abnormality and lack of cord enlargement. Cord infarct is a consideration. B12 deficiency typically is higher in the cervical cord and involves both posterior columns.  06/03/16 MRI brain  - Normal for age MRI appearance of the brain.  06/03/16 MRI thoracic spine  - The bulk of the abnormal spinal cord lesion was imaged on the recent cervical exam. There is abnormal T2 signal within the cord from C5-C6 to T5-T6. There is abnormal dorsal spinal cord enhancement from C6-C7 to T3-T4. There appears to be a small syrinx at those levels associated with the abnormality. 3. The spinal cord below C6 is normal. No associated osseous or paraspinal abnormality. No associated spinal cord  vasculature is evident. - Top differential considerations include: Primary spinal cord tumor, lymphoma or metastatic disease to the cord, and less likely an inflammatory process (such as sarcoidosis). Consider follow-up CT chest abdomen and pelvis (oral and IV contrast preferred) to evaluate for any non CNS primary tumor site. CSF analysis may also be valuable.  07/22/16 MRI of the cervical spine with and without contrast shows the  following: 1.     Abnormal signal within the central and left posterior spinal cord from C5-6 caudally into the upper thoracic spine. There is enhancement of the left posterior column from C6 to the upper thoracic spine.   The spinal cord is not enlarged. When compared to the study dated 06/01/2016, there is no definite interval change. The etiology is uncertain, this could represent a resolving transverse myelitis. Neoplasm  is also possible, though less likely with the stability over time. B12 or copper deficiency can lead to changes within the posterior columns though the finding is usually bilateral. 2.    Multilevel degenerative changes as detailed above that lead to some encroachment upon the exiting right C5 and C7 nerve roots but no definite nerve root compression. This appears stable when compared to the prior study.  07/22/16 MRI of the thoracic spine with and without contrast shows the following: 1.    Abnormal signal centrally within the spinal cord extending from C5-C6 to T5-T6.   There is also abnormal signal on T2-weighted images with post contrast enhancement involving the left posterior column from C6-C7 to T3-T4. The overall appearance is unchanged when compared to the 06/03/2016 MRI.   The etiology is uncertain. The enhancing component could be due to a resolving transverse myelitis or to a primary neoplasm. Stability since the prior study be less likely with metastatic disease or lymphoma. An inflammatory process such as sarcoidosis is also in the differential  diagnosis. Additionally, part of the nonenhancing changes could represent a syrinx..      2.    There are no significant degenerative changes and the lower thoracic spinal cord appears normal.  06/25/16 CT chest / abd / pelvis 1. No evidence of primary malignancy or metastatic disease in the chest, abdomen or pelvis. 2. Minimal colonic diverticulosis. 3. Dense coronary artery atheromatous calcifications. 4. Aortic atherosclerosis. 5. Degenerative right sacroiliitis.  06/21/16 Lab testing: ANA, ANCA, HEPATITIS, HIV, RPR, B12, FOLATE, ACE - all negative / normal  06/28/16 CSF studies --> WBC 11, RBC 11, glucose 70, protein 50, VDRL non-reactive, cultures negative, cytology negative, OCB (2 well defined gamma restriction bands that are also present in the patient's corresponding serum sample, but some bands in the CSF are more prominent)     ASSESSMENT AND PLAN  76 y.o. year old male here with new onset burning, tingling, numbness sensation in left axillary region and left scapular region, with subjective muscle weakness and left upper extremity. Patient found to have unusual abnormal MRI cervical spine with longitudinally extensive spinal cord lesion centrally and within the left posterior column region extending from C6 level down to visualized upper thoracic spinal cord region. Would favor inflammatory, autoimmune, vascular or neoplastic etiologies.   Ddx: transverse myelitis (due to spinal cord inflammatory/autoimmune, vascular, neoplastic etiologies)  1. Transverse myelitis ([place])   2. Numbness   3. Neck pain   4. Osteoarthritis of both wrists, unspecified osteoarthritis type      PLAN:  TRANSVERSE MYELITIS (established problem, stable) - monitor symptoms - check vitamin D level - reviewed nutrition and fitness strategies - repeat MRIs in ~ July 2018  OSTEOARTHRITIS (established problem, stable) - continue tylenol or ibuprofen as needed - continue diclofenac gel as needed -  continue exercises  Orders Placed This Encounter  Procedures  . MR BRAIN W WO CONTRAST  . MR CERVICAL SPINE W WO CONTRAST  . VITAMIN D 25 Hydroxy (Vit-D Deficiency, Fractures)   Return in about 6 months (around 11/23/2017).  Penni Bombard, MD 05/31/5092, 2:67 AM Certified in Neurology, Neurophysiology and Neuroimaging  [facility] [address] [place], [place] [address] [phone]

## 2017-05-25 ENCOUNTER — Telehealth: Payer: Self-pay | Admitting: *Deleted

## 2017-05-25 LAB — VITAMIN D 25 HYDROXY (VIT D DEFICIENCY, FRACTURES): VIT D 25 HYDROXY: 33.2 ng/mL (ref 30.0–100.0)

## 2017-05-25 NOTE — Telephone Encounter (Signed)
Spoke with wife, [name] on [place] and informed her that her husband's Vit D level is fine. She verbalized understanding, appreciation.

## 2017-06-06 DIAGNOSIS — E039 Hypothyroidism, unspecified: Secondary | ICD-10-CM | POA: Insufficient documentation

## 2017-06-08 ENCOUNTER — Encounter: Payer: Self-pay | Admitting: Nurse Practitioner

## 2017-06-08 ENCOUNTER — Other Ambulatory Visit: Payer: Self-pay | Admitting: Psychiatry

## 2017-06-08 DIAGNOSIS — G0491 Myelitis, unspecified: Secondary | ICD-10-CM

## 2017-06-09 ENCOUNTER — Encounter: Payer: Self-pay | Admitting: Internal Medicine

## 2017-06-09 ENCOUNTER — Telehealth: Payer: Self-pay | Admitting: Internal Medicine

## 2017-06-09 NOTE — Telephone Encounter (Signed)
[name] requesting colonoscopy - patient has abnl PET - descending colon  I reviewed records  OK for direct colonoscopy

## 2017-06-10 ENCOUNTER — Telehealth: Payer: Self-pay

## 2017-06-10 NOTE — Telephone Encounter (Signed)
Patient was called prior to Pre-Visit appointment to ask if he had experienced any constipation. Patient reports that he has not had constipation, therefore he will receive instructions for a one day prep. OK per Dr. Carlean Purl.  Riki Sheer, LPN

## 2017-06-10 NOTE — Telephone Encounter (Signed)
I think bowel blockage is incorrect - he has abnormal PET scan - however if my impression is wrong and he is having significant constipation then have him take 4 dulcolax and 4 doses MiraLax on day before prep. Otherwise everything would be the same  Thanks for your excellent work  CEG

## 2017-06-10 NOTE — Telephone Encounter (Signed)
Dr. Carlean Purl,  Alec Snyder is a direct colonoscopy because he had an abnormal PET scan questionable bowel blockage.  He is scheduled for 1:30 Pm on 06/21/17. Do you want him to prep as normal? Light breakfast and one day Miralax? Please advise. Thanks.    Riki Sheer, LPN

## 2017-06-14 ENCOUNTER — Ambulatory Visit (AMBULATORY_SURGERY_CENTER): Payer: Self-pay

## 2017-06-14 ENCOUNTER — Ambulatory Visit: Payer: Commercial Managed Care - PPO | Admitting: Nurse Practitioner

## 2017-06-14 VITALS — Ht 68.0 in | Wt 167.2 lb

## 2017-06-14 DIAGNOSIS — R948 Abnormal results of function studies of other organs and systems: Secondary | ICD-10-CM

## 2017-06-14 NOTE — Progress Notes (Signed)
Per pt, no allergies to soy or egg products.Pt not taking any weight loss meds or using  O2 at home.   Pt refused Emmi video. 

## 2017-06-17 ENCOUNTER — Telehealth: Payer: Self-pay

## 2017-06-17 NOTE — Telephone Encounter (Signed)
-----   Message from Gatha Mayer, MD sent at 06/16/2017  5:48 PM EDT ----- Regarding: OK for [place] He has a + quantiferon but PET scan does not show any lung changes to suggest active TB so he is aok to do colonoscopy  Let Gwyndolyn Saxon or whomever know and we may need to let patient know I have reviewed  He will need follow-up on this through MD that ordered and could affect what they treat him with for spine issues  CEG

## 2017-06-17 NOTE — Telephone Encounter (Signed)
Spoke with Mr Fowle and informed him of Dr Celesta Aver message.  [facility] aware that ok to do colonscopy.  Mr Gedney will be here 06/21/17 at 12:30pm for his 1:30pm appointment.

## 2017-06-21 ENCOUNTER — Ambulatory Visit (AMBULATORY_SURGERY_CENTER): Payer: Commercial Managed Care - PPO | Admitting: Internal Medicine

## 2017-06-21 ENCOUNTER — Encounter: Payer: Self-pay | Admitting: Internal Medicine

## 2017-06-21 VITALS — BP 172/94 | HR 54 | Temp 99.1°F | Resp 31 | Ht 68.0 in | Wt 167.0 lb

## 2017-06-21 DIAGNOSIS — D123 Benign neoplasm of transverse colon: Secondary | ICD-10-CM

## 2017-06-21 DIAGNOSIS — R948 Abnormal results of function studies of other organs and systems: Secondary | ICD-10-CM | POA: Diagnosis present

## 2017-06-21 DIAGNOSIS — D124 Benign neoplasm of descending colon: Secondary | ICD-10-CM

## 2017-06-21 MED ORDER — SODIUM CHLORIDE 0.9 % IV SOLN: 500.0000 mL | INTRAVENOUS | Status: AC

## 2017-06-21 NOTE — Progress Notes (Signed)
Alert and oriented x3, pleased with MAC, report to RN Megan 

## 2017-06-21 NOTE — Patient Instructions (Addendum)
   I found and removed one large polyp and two tiny polyps from the colon.  The large polyp site was marked with two tattoos and a metallic clip placed to reduce post-procedure bleeding. It is thought this clip is ok in MRI scanner but if possible would wait 1 month before having an MRI.  I will let you know pathology results and when to have another routine colonoscopy by mail and/or My Chart.  I appreciate the opportunity to care for you. Gatha Mayer, MD, [facility]  **Handouts given on Polyps and a clip card**  YOU HAD AN ENDOSCOPIC PROCEDURE TODAY: Refer to the procedure report and other information in the discharge instructions given to you for any specific questions about what was found during the examination. If this information does not answer your questions, please call [facility] office at [phone] to clarify.   YOU SHOULD EXPECT: Some feelings of bloating in the abdomen. Passage of more gas than usual. Walking can help get rid of the air that was put into your GI tract during the procedure and reduce the bloating. If you had a lower endoscopy (such as a colonoscopy or flexible sigmoidoscopy) you may notice spotting of blood in your stool or on the toilet paper. Some abdominal soreness may be present for a day or two, also.  DIET: Your first meal following the procedure should be a light meal and then it is ok to progress to your normal diet. A half-sandwich or bowl of soup is an example of a good first meal. Heavy or fried foods are harder to digest and may make you feel nauseous or bloated. Drink plenty of fluids but you should avoid alcoholic beverages for 24 hours. If you had a esophageal dilation, please see attached instructions for diet.    ACTIVITY: Your care partner should take you home directly after the procedure. You should plan to take it easy, moving slowly for the rest of the day. You can resume normal activity the day after the procedure however YOU SHOULD NOT DRIVE,  use power tools, machinery or perform tasks that involve climbing or major physical exertion for 24 hours (because of the sedation medicines used during the test).   SYMPTOMS TO REPORT IMMEDIATELY: A gastroenterologist can be reached at any hour. Please call [phone]  for any of the following symptoms:  Following lower endoscopy (colonoscopy, flexible sigmoidoscopy) Excessive amounts of blood in the stool  Significant tenderness, worsening of abdominal pains  Swelling of the abdomen that is new, acute  Fever of 100 or higher    FOLLOW UP:  If any biopsies were taken you will be contacted by phone or by letter within the next 1-3 weeks. Call [phone]  if you have not heard about the biopsies in 3 weeks.  Please also call with any specific questions about appointments or follow up tests.

## 2017-06-21 NOTE — Op Note (Signed)
[facility] Patient Name: Alec Snyder Procedure Date: 06/21/2017 1:26 PM MRN: [id number] Endoscopist: Gatha Mayer , MD Age: 76 Referring MD:  Date of Birth: Mar 04, 1941 Gender: Male Account #: [id number] Procedure:                Colonoscopy Indications:              Evaluation on imaging study of clinically                            significant abnormality, abnormal PET descending                            colon Medicines:                Propofol per Anesthesia, Monitored Anesthesia Care Procedure:                Pre-Anesthesia Assessment:                           - Prior to the procedure, a History and Physical                            was performed, and patient medications and                            allergies were reviewed. The patient's tolerance of                            previous anesthesia was also reviewed. The risks                            and benefits of the procedure and the sedation                            options and risks were discussed with the patient.                            All questions were answered, and informed consent                            was obtained. Prior Anticoagulants: The patient                            last took aspirin 1 day prior to the procedure. ASA                            Grade Assessment: II - A patient with mild systemic                            disease. After reviewing the risks and benefits,                            the patient was deemed in satisfactory condition to  undergo the procedure.                           After obtaining informed consent, the colonoscope                            was passed under direct vision. Throughout the                            procedure, the patient's blood pressure, pulse, and                            oxygen saturations were monitored continuously. The                            Colonoscope was introduced through the anus and                          advanced to the the cecum, identified by                            appendiceal orifice and ileocecal valve. The                            colonoscopy was performed without difficulty. The                            patient tolerated the procedure well. The quality                            of the bowel preparation was excellent. The bowel                            preparation used was Miralax. The ileocecal valve,                            appendiceal orifice, and rectum were photographed. Scope In: 1:36:30 PM Scope Out: 2:02:15 PM Scope Withdrawal Time: 0 hours 20 minutes 2 seconds  Total Procedure Duration: 0 hours 25 minutes 45 seconds  Findings:                 The perianal and digital rectal examinations were                            normal. Pertinent negatives include normal prostate                            (size, shape, and consistency).                           A 30 mm polyp was found in the descending colon.                            The polyp was pedunculated. The polyp was removed  with a hot snare. Resection and retrieval were                            complete. Verification of patient identification                            for the specimen was done. Estimated blood loss:                            none. Area was successfully injected with 4 mL of a                            1:10,000 solution of epinephrine for polyp size                            reduction and prevent bleeding. Estimated blood                            loss: none. Area was tattooed with an injection of                            3 mL of Spot (carbon black). To prevent bleeding                            after the polypectomy, one hemostatic clip was                            successfully placed (MR conditional). There was no                            bleeding during, or at the end, of the procedure.                           Two sessile  polyps were found in the transverse                            colon. The polyps were diminutive in size. These                            polyps were removed with a cold snare. Resection                            and retrieval were complete. Verification of                            patient identification for the specimen was done.                            Estimated blood loss was minimal.                           The exam was otherwise without abnormality on  direct and retroflexion views. Complications:            No immediate complications. Estimated Blood Loss:     Estimated blood loss was minimal. Impression:               - One 30 mm polyp in the descending colon, removed                            with a hot snare. Resected and retrieved. Injected.                            Tattooed. Clip (MR conditional) was placed.                           - Two diminutive polyps in the transverse colon,                            removed with a cold snare. Resected and retrieved.                           - The examination was otherwise normal on direct                            and retroflexion views. Recommendation:           - Patient has a contact number available for                            emergencies. The signs and symptoms of potential                            delayed complications were discussed with the                            patient. Return to normal activities tomorrow.                            Written discharge instructions were provided to the                            patient.                           - Resume previous diet.                           - Continue present medications.                           - No aspirin, ibuprofen, naproxen, or other                            non-steroidal anti-inflammatory drugs for 2 weeks                            after polyp removal.                           -  Await pathology results.                            - Repeat colonoscopy is recommended. The                            colonoscopy date will be determined after pathology                            results from today's exam become available for                            review.                           - If possible avoid MRI for 1 month Gatha Mayer, MD 06/21/2017 2:20:06 PM This report has been signed electronically.

## 2017-06-21 NOTE — Progress Notes (Signed)
Called to room to assist during endoscopic procedure.  Patient ID and intended procedure confirmed with present staff. Received instructions for my participation in the procedure from the performing physician.  

## 2017-06-21 NOTE — Progress Notes (Signed)
Pt's states no medical or surgical changes since previsit or office visit. 

## 2017-06-22 ENCOUNTER — Telehealth: Payer: Self-pay | Admitting: *Deleted

## 2017-06-22 NOTE — Telephone Encounter (Signed)
  Follow up Call-  Call back number 06/21/2017  Post procedure Call Back phone  # [phone]  Permission to leave phone message Yes  Some recent data might be hidden     Patient questions:  Do you have a fever, pain , or abdominal swelling? No. Pain Score  0 *  Have you tolerated food without any problems? Yes.    Have you been able to return to your normal activities? Yes.    Do you have any questions about your discharge instructions: Diet   No. Medications  No. Follow up visit  No.  Do you have questions or concerns about your Care? No.  Actions: * If pain score is 4 or above: No action needed, pain <4.

## 2017-06-27 ENCOUNTER — Encounter: Payer: Self-pay | Admitting: Diagnostic Neuroimaging

## 2017-06-30 ENCOUNTER — Encounter: Payer: Self-pay | Admitting: Internal Medicine

## 2017-06-30 DIAGNOSIS — Z860101 Personal history of adenomatous and serrated colon polyps: Secondary | ICD-10-CM

## 2017-06-30 DIAGNOSIS — Z86010 Personal history of colonic polyps: Secondary | ICD-10-CM

## 2017-06-30 HISTORY — DX: Personal history of adenomatous and serrated colon polyps: Z86.0101

## 2017-06-30 HISTORY — DX: Personal history of colonic polyps: Z86.010

## 2017-06-30 NOTE — Progress Notes (Signed)
3 tubular adenomas Recall 3 yrs - consider repeat My Chart letter

## 2017-07-06 ENCOUNTER — Other Ambulatory Visit: Payer: Commercial Managed Care - PPO

## 2017-07-07 ENCOUNTER — Other Ambulatory Visit: Payer: Commercial Managed Care - PPO

## 2017-07-12 DIAGNOSIS — R7612 Nonspecific reaction to cell mediated immunity measurement of gamma interferon antigen response without active tuberculosis: Secondary | ICD-10-CM | POA: Insufficient documentation

## 2017-07-18 ENCOUNTER — Other Ambulatory Visit: Payer: Commercial Managed Care - PPO

## 2017-07-19 ENCOUNTER — Other Ambulatory Visit: Payer: Commercial Managed Care - PPO

## 2017-07-20 ENCOUNTER — Ambulatory Visit
Admission: RE | Admit: 2017-07-20 | Discharge: 2017-07-20 | Disposition: A | Discharge status: Home / Self Care | Payer: Commercial Managed Care - PPO | Source: Ambulatory Visit | Attending: Diagnostic Neuroimaging | Admitting: Diagnostic Neuroimaging

## 2017-07-20 ENCOUNTER — Other Ambulatory Visit: Payer: Commercial Managed Care - PPO

## 2017-07-20 ENCOUNTER — Ambulatory Visit
Admission: RE | Admit: 2017-07-20 | Discharge: 2017-07-20 | Disposition: A | Discharge status: Home / Self Care | Payer: Commercial Managed Care - PPO | Source: Ambulatory Visit | Attending: Psychiatry | Admitting: Psychiatry

## 2017-07-20 DIAGNOSIS — M19032 Primary osteoarthritis, left wrist: Secondary | ICD-10-CM

## 2017-07-20 DIAGNOSIS — G0491 Myelitis, unspecified: Secondary | ICD-10-CM

## 2017-07-20 DIAGNOSIS — G373 Acute transverse myelitis in demyelinating disease of central nervous system: Secondary | ICD-10-CM

## 2017-07-20 DIAGNOSIS — R200 Anesthesia of skin: Secondary | ICD-10-CM

## 2017-07-20 DIAGNOSIS — M542 Cervicalgia: Secondary | ICD-10-CM

## 2017-07-20 DIAGNOSIS — M19031 Primary osteoarthritis, right wrist: Secondary | ICD-10-CM

## 2017-07-20 MED ORDER — GADOBENATE DIMEGLUMINE 529 MG/ML IV SOLN
15.0000 mL | Freq: Once | INTRAVENOUS | Status: AC | PRN
  Administered 2017-07-20: 15 mL via INTRAVENOUS

## 2017-07-24 ENCOUNTER — Other Ambulatory Visit: Payer: Commercial Managed Care - PPO

## 2017-07-24 ENCOUNTER — Ambulatory Visit
Admission: RE | Admit: 2017-07-24 | Discharge: 2017-07-24 | Disposition: A | Discharge status: Home / Self Care | Payer: Commercial Managed Care - PPO | Source: Ambulatory Visit | Attending: Diagnostic Neuroimaging | Admitting: Diagnostic Neuroimaging

## 2017-07-24 DIAGNOSIS — M542 Cervicalgia: Secondary | ICD-10-CM

## 2017-07-24 DIAGNOSIS — M19031 Primary osteoarthritis, right wrist: Secondary | ICD-10-CM

## 2017-07-24 DIAGNOSIS — G373 Acute transverse myelitis in demyelinating disease of central nervous system: Secondary | ICD-10-CM

## 2017-07-24 DIAGNOSIS — M19032 Primary osteoarthritis, left wrist: Secondary | ICD-10-CM

## 2017-07-24 DIAGNOSIS — R200 Anesthesia of skin: Secondary | ICD-10-CM

## 2017-07-24 MED ORDER — GADOBENATE DIMEGLUMINE 529 MG/ML IV SOLN
15.0000 mL | Freq: Once | INTRAVENOUS | Status: AC | PRN
  Administered 2017-07-24: 15 mL via INTRAVENOUS

## 2017-08-17 ENCOUNTER — Ambulatory Visit
Admission: RE | Admit: 2017-08-17 | Discharge: 2017-08-17 | Disposition: A | Discharge status: Home / Self Care | Payer: No Typology Code available for payment source | Source: Ambulatory Visit | Attending: Internal Medicine | Admitting: Internal Medicine

## 2017-08-17 ENCOUNTER — Other Ambulatory Visit: Payer: Self-pay | Admitting: Internal Medicine

## 2017-08-17 DIAGNOSIS — R7612 Nonspecific reaction to cell mediated immunity measurement of gamma interferon antigen response without active tuberculosis: Secondary | ICD-10-CM

## 2017-11-23 ENCOUNTER — Ambulatory Visit: Payer: Commercial Managed Care - PPO | Admitting: Diagnostic Neuroimaging

## 2018-02-25 IMAGING — CT CT ABD-PELV W/ CM
4 of 5 series · 10 of 46 positions shown, 15 images · IV contrast (APPLIED)
Comparison: Recent cervical and thoracic MR examinations.

CLINICAL DATA: Cervical and thoracic cord lesion with differential
considerations include metastatic disease and lymphoma. The current
examinations are to evaluate for a possible primary malignancy in
the chest, abdomen or pelvis.

EXAM:
CT CHEST, ABDOMEN, AND PELVIS WITH CONTRAST
TECHNIQUE: Multidetector CT imaging of the chest, abdomen and pelvis was
performed following the standard protocol during bolus
administration of intravenous contrast.
CONTRAST:  100mL O208ND-6PP IOPAMIDOL (O208ND-6PP) INJECTION 61%

[Series 2: chest/abd/pelvis w/cm · axial · 0.75mm/px · z∈[+86,+156]mm · 2 of 130 slices shown]
[im 15/130  soft-tissue]
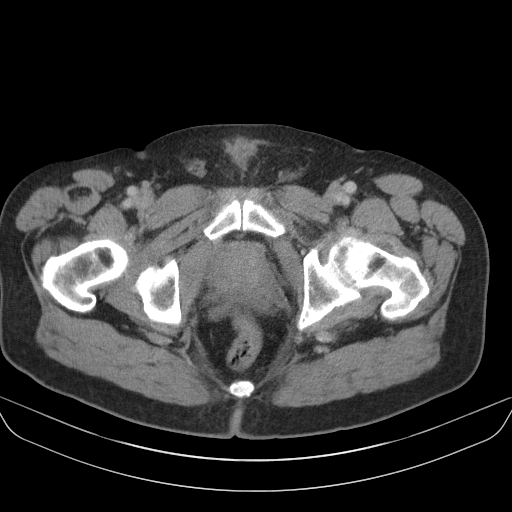
[im 29/130  soft-tissue]
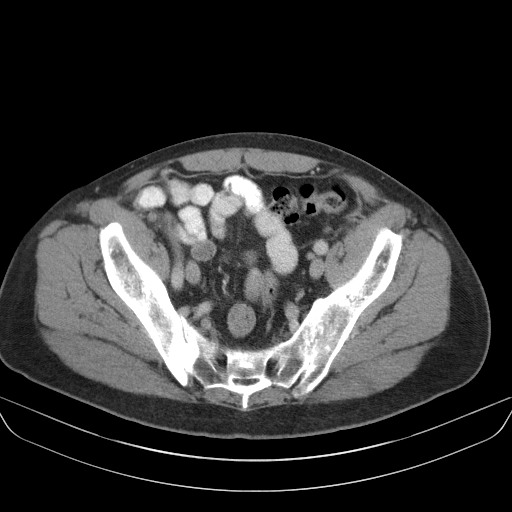

[Series 3: cor · coronal · 0.74mm/px · 3 of 94 slices shown]
[im 32/94  soft-tissue]
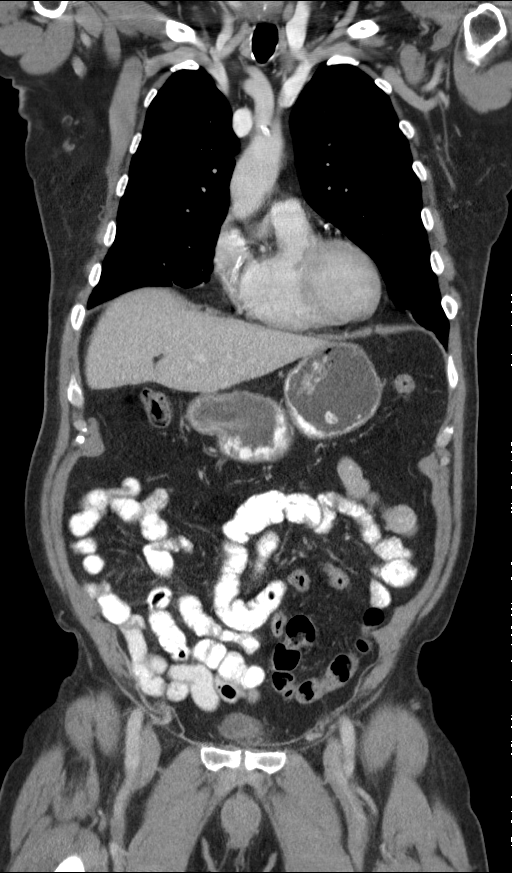
[im 42/94  soft-tissue]
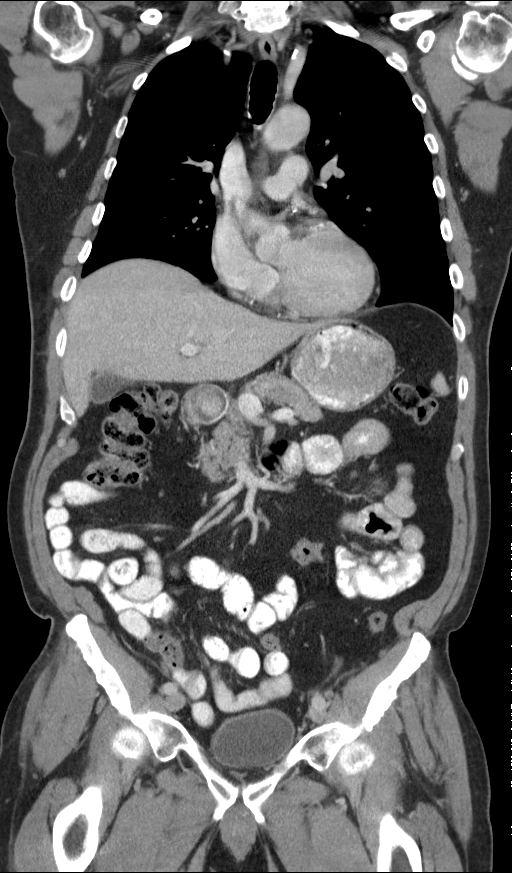
[im 52/94  soft-tissue]
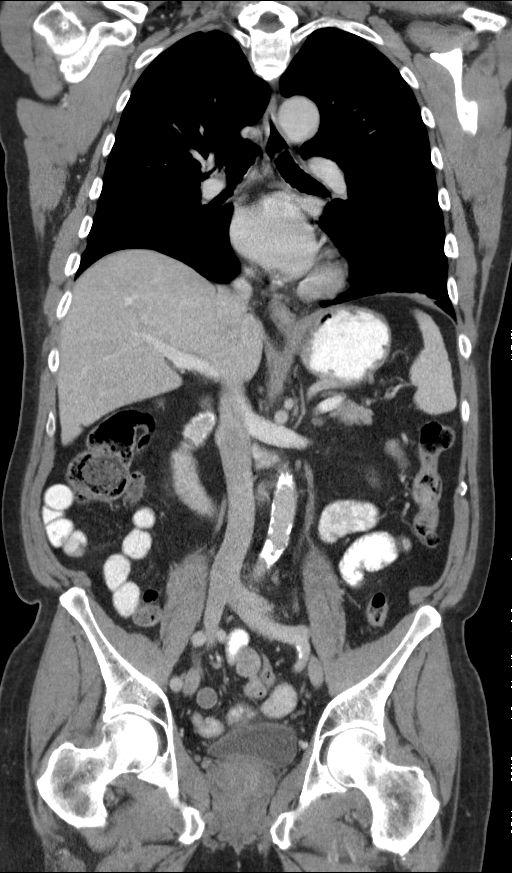

[Series 4: sag · sagittal · 0.56mm/px · 1 of 116 slices shown]
[im 39/116  soft-tissue]
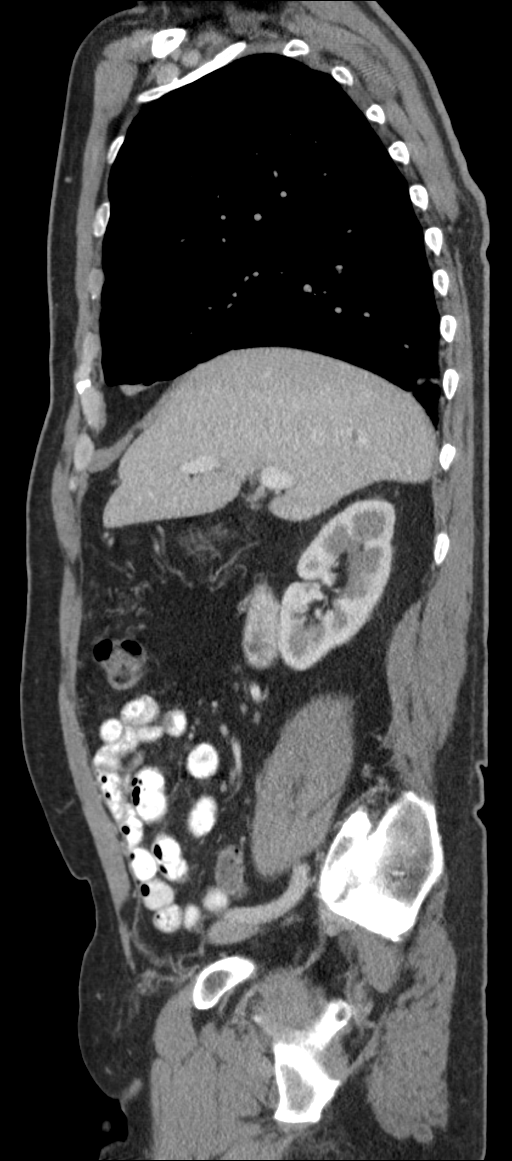

[Series 7: renal delay · axial · delayed · 0.75mm/px · z∈[+284,+398]mm · 4 of 39 slices shown, 9 images]
[im 8/39  soft-tissue]
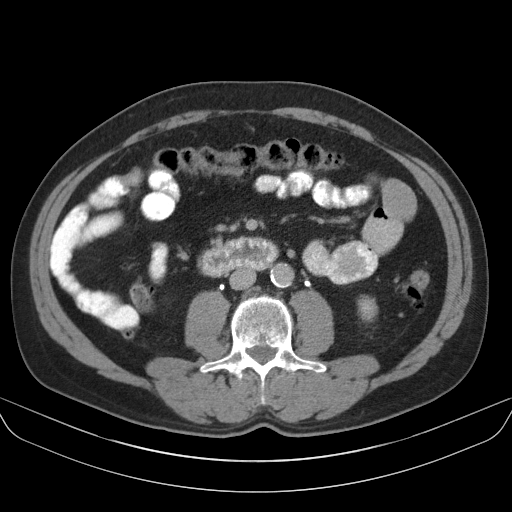
[im 8/39  lung]
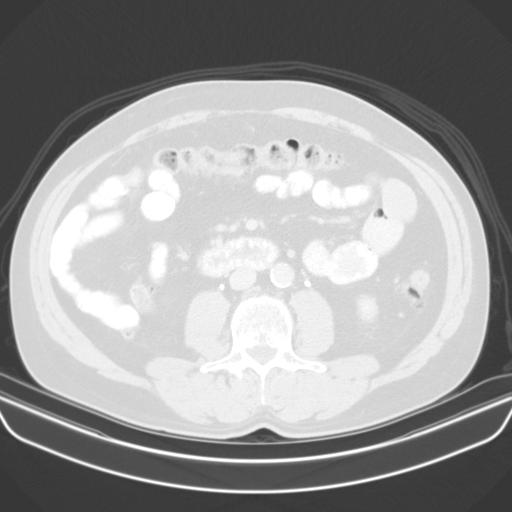
[im 8/39  bone]
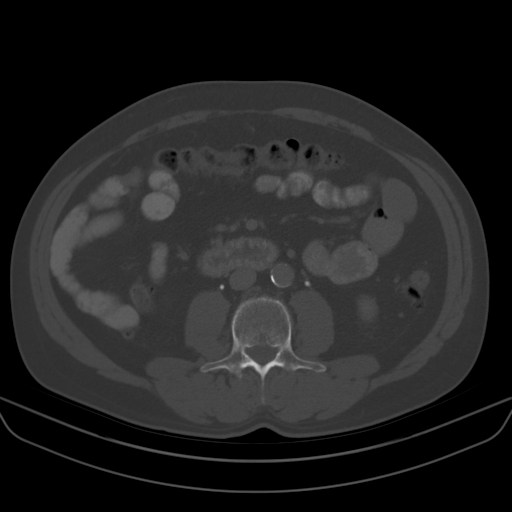
[im 16/39  soft-tissue]
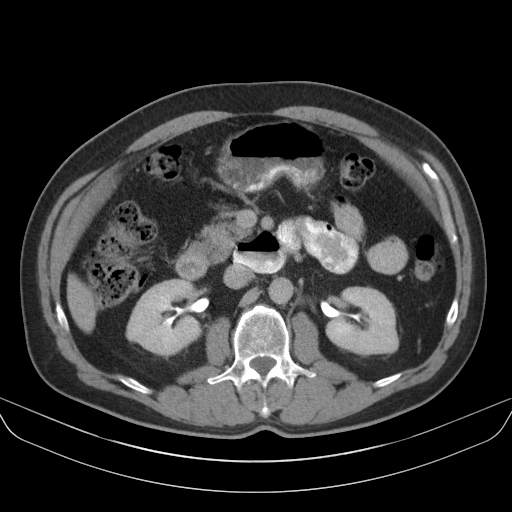
[im 16/39  lung]
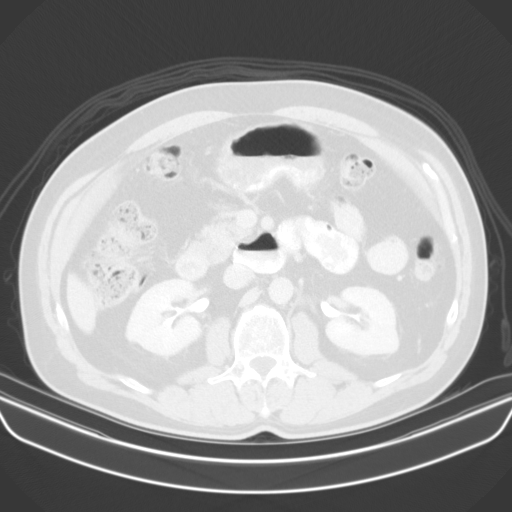
[im 23/39  soft-tissue]
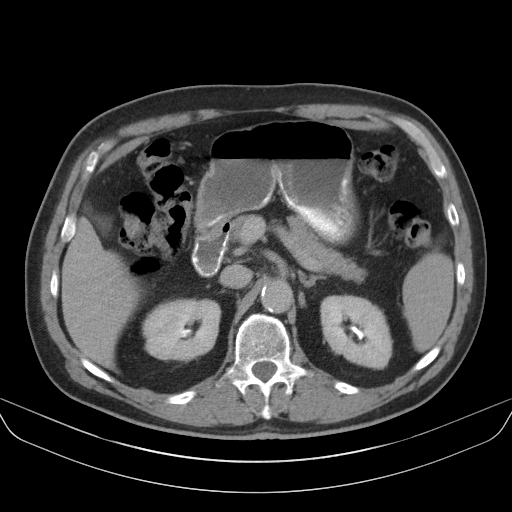
[im 23/39  lung]
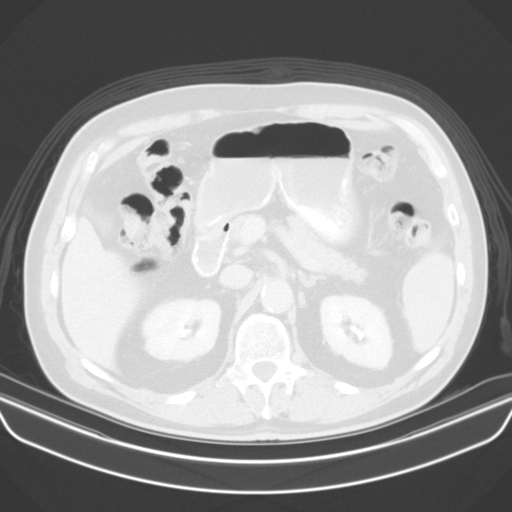
[im 31/39  soft-tissue]
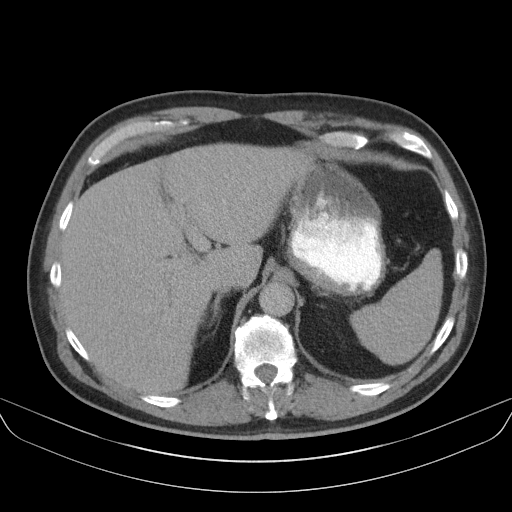
[im 31/39  lung]
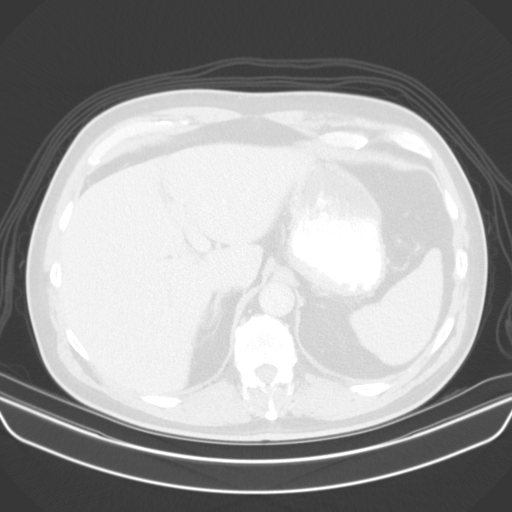

[10 of 46 positions shown; findings below may reference images not displayed]

FINDINGS: CT CHEST FINDINGS

Mediastinum/Lymph Nodes: Dense coronary artery calcifications and
thoracic aortic calcifications. No mediastinal mass or enlarged
lymph nodes. The cord abnormality on the MR examinations is not
visible on the CT images.

Lungs/Pleura: Small amount of linear atelectasis or scarring at both
lung bases. There is also a triangular scar beneath the minor
fissure on the right. No lung nodules or pleural fluid.

Musculoskeletal: Thoracic spine degenerative changes. No evidence of
bony metastatic disease.

CT ABDOMEN PELVIS FINDINGS

Hepatobiliary: No masses or other significant abnormality.

Pancreas: No mass, inflammatory changes, or other significant
abnormality.

Spleen: Within normal limits in size and appearance.

Adrenals/Urinary Tract: Tiny left renal cysts. Normal appearing
right kidney, ureters, urinary bladder and adrenal glands.

Stomach/Bowel: Scattered colonic diverticula. No gastric or small
bowel abnormalities. Normal appearing appendix.

Vascular/Lymphatic: Atheromatous arterial calcifications, including
the abdominal aorta. No enlarged lymph nodes.

Reproductive: Mildly enlarged prostate gland.

Other: Small left inguinal hernia containing fat and tiny umbilical
hernia containing fat.

Musculoskeletal: Bilateral iliac bone islands. Sclerosis on both
sides of the right sacroiliac joint with associated spur formation.
Lumbar spine degenerative changes. No evidence of bony metastatic
disease.
IMPRESSION: 1. No evidence of primary malignancy or metastatic disease in the
chest, abdomen or pelvis.
2. Minimal colonic diverticulosis.
3. Dense coronary artery atheromatous calcifications.
4. Aortic atherosclerosis.
5. Degenerative right sacroiliitis.

## 2018-02-28 IMAGING — XA DG FLUORO GUIDE LUMBAR PUNCTURE
1 series · 1 of 1 positions shown · non-contrast
Comparison: none

CLINICAL DATA: Possible transverse myelitis. Chest pain. Neck and
shoulder pain.

[Series 1: ortho standard · 1 of 1 slices shown]
[im 1/1]
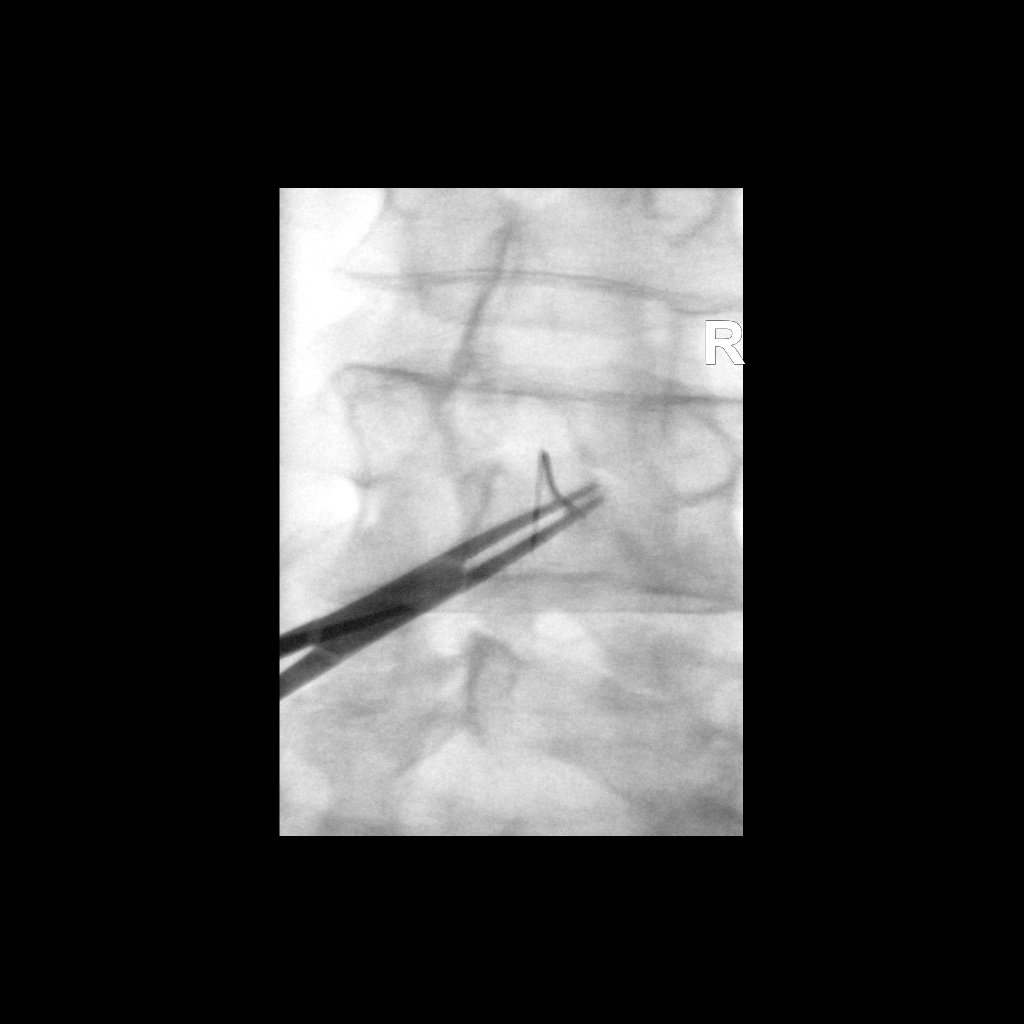

[1 of 1 positions shown; findings below may reference images not displayed]

EXAM:
DIAGNOSTIC LUMBAR PUNCTURE UNDER FLUOROSCOPIC GUIDANCE

FLUOROSCOPY TIME:  Radiation Exposure Index (as provided by the
fluoroscopic device): 0 minutes 12 seconds. 17.69 micro gray meter
squared

PROCEDURE:
Informed consent was obtained from the patient prior to the
procedure, including potential complications of headache, allergy,
and pain. With the patient prone, the lower back was prepped with
Betadine. 1% Lidocaine was used for local anesthesia. Lumbar
puncture was performed at the right L3-4 level using a 20 gauge
needle with return of clear CSF with an opening pressure of 12 cm
water. Twelve ml of CSF were obtained for laboratory studies. The
patient tolerated the procedure well and there were no apparent
complications.
IMPRESSION: Lumbar puncture on the right at L3-4. Clear CSF collected. Normal
opening pressure.

## 2018-06-27 DIAGNOSIS — Z0001 Encounter for general adult medical examination with abnormal findings: Secondary | ICD-10-CM | POA: Diagnosis not present

## 2018-06-27 DIAGNOSIS — E538 Deficiency of other specified B group vitamins: Secondary | ICD-10-CM | POA: Diagnosis not present

## 2018-06-27 DIAGNOSIS — E039 Hypothyroidism, unspecified: Secondary | ICD-10-CM | POA: Diagnosis not present

## 2018-06-27 DIAGNOSIS — B353 Tinea pedis: Secondary | ICD-10-CM | POA: Diagnosis not present

## 2018-06-27 DIAGNOSIS — M539 Dorsopathy, unspecified: Secondary | ICD-10-CM | POA: Diagnosis not present

## 2018-06-27 DIAGNOSIS — N529 Male erectile dysfunction, unspecified: Secondary | ICD-10-CM | POA: Diagnosis not present

## 2018-06-27 DIAGNOSIS — E7800 Pure hypercholesterolemia, unspecified: Secondary | ICD-10-CM | POA: Diagnosis not present

## 2018-06-27 DIAGNOSIS — E559 Vitamin D deficiency, unspecified: Secondary | ICD-10-CM | POA: Diagnosis not present

## 2018-06-27 DIAGNOSIS — G373 Acute transverse myelitis in demyelinating disease of central nervous system: Secondary | ICD-10-CM | POA: Diagnosis not present

## 2018-06-27 DIAGNOSIS — M545 Low back pain: Secondary | ICD-10-CM | POA: Diagnosis not present

## 2018-06-27 DIAGNOSIS — I10 Essential (primary) hypertension: Secondary | ICD-10-CM | POA: Diagnosis not present

## 2018-06-27 DIAGNOSIS — R7303 Prediabetes: Secondary | ICD-10-CM | POA: Diagnosis not present

## 2018-06-27 DIAGNOSIS — Z1212 Encounter for screening for malignant neoplasm of rectum: Secondary | ICD-10-CM | POA: Diagnosis not present

## 2018-08-22 DIAGNOSIS — M859 Disorder of bone density and structure, unspecified: Secondary | ICD-10-CM | POA: Diagnosis not present

## 2018-08-22 DIAGNOSIS — Z23 Encounter for immunization: Secondary | ICD-10-CM | POA: Diagnosis not present

## 2018-08-22 DIAGNOSIS — E7800 Pure hypercholesterolemia, unspecified: Secondary | ICD-10-CM | POA: Diagnosis not present

## 2018-08-22 DIAGNOSIS — E039 Hypothyroidism, unspecified: Secondary | ICD-10-CM | POA: Diagnosis not present

## 2018-08-22 DIAGNOSIS — M85852 Other specified disorders of bone density and structure, left thigh: Secondary | ICD-10-CM | POA: Diagnosis not present

## 2018-08-22 DIAGNOSIS — M85851 Other specified disorders of bone density and structure, right thigh: Secondary | ICD-10-CM | POA: Diagnosis not present

## 2018-08-22 DIAGNOSIS — G4700 Insomnia, unspecified: Secondary | ICD-10-CM | POA: Diagnosis not present

## 2018-08-22 DIAGNOSIS — M8580 Other specified disorders of bone density and structure, unspecified site: Secondary | ICD-10-CM | POA: Diagnosis not present

## 2018-08-30 DIAGNOSIS — D2271 Melanocytic nevi of right lower limb, including hip: Secondary | ICD-10-CM | POA: Diagnosis not present

## 2018-08-30 DIAGNOSIS — L814 Other melanin hyperpigmentation: Secondary | ICD-10-CM | POA: Diagnosis not present

## 2018-08-30 DIAGNOSIS — D2272 Melanocytic nevi of left lower limb, including hip: Secondary | ICD-10-CM | POA: Diagnosis not present

## 2018-08-30 DIAGNOSIS — L570 Actinic keratosis: Secondary | ICD-10-CM | POA: Diagnosis not present

## 2018-08-30 DIAGNOSIS — L821 Other seborrheic keratosis: Secondary | ICD-10-CM | POA: Diagnosis not present

## 2018-08-30 DIAGNOSIS — B351 Tinea unguium: Secondary | ICD-10-CM | POA: Diagnosis not present

## 2018-08-30 DIAGNOSIS — Z85828 Personal history of other malignant neoplasm of skin: Secondary | ICD-10-CM | POA: Diagnosis not present

## 2018-08-30 DIAGNOSIS — D1801 Hemangioma of skin and subcutaneous tissue: Secondary | ICD-10-CM | POA: Diagnosis not present

## 2018-08-30 DIAGNOSIS — B353 Tinea pedis: Secondary | ICD-10-CM | POA: Diagnosis not present

## 2018-09-20 DIAGNOSIS — E782 Mixed hyperlipidemia: Secondary | ICD-10-CM | POA: Diagnosis not present

## 2018-09-20 DIAGNOSIS — E039 Hypothyroidism, unspecified: Secondary | ICD-10-CM | POA: Diagnosis not present

## 2018-09-20 DIAGNOSIS — I10 Essential (primary) hypertension: Secondary | ICD-10-CM | POA: Diagnosis not present

## 2018-12-22 DIAGNOSIS — B351 Tinea unguium: Secondary | ICD-10-CM | POA: Diagnosis not present

## 2018-12-22 DIAGNOSIS — B353 Tinea pedis: Secondary | ICD-10-CM | POA: Diagnosis not present

## 2018-12-22 DIAGNOSIS — Z85828 Personal history of other malignant neoplasm of skin: Secondary | ICD-10-CM | POA: Diagnosis not present

## 2018-12-22 DIAGNOSIS — L570 Actinic keratosis: Secondary | ICD-10-CM | POA: Diagnosis not present

## 2018-12-22 DIAGNOSIS — C44319 Basal cell carcinoma of skin of other parts of face: Secondary | ICD-10-CM | POA: Diagnosis not present

## 2018-12-22 DIAGNOSIS — D485 Neoplasm of uncertain behavior of skin: Secondary | ICD-10-CM | POA: Diagnosis not present

## 2019-01-15 DIAGNOSIS — E559 Vitamin D deficiency, unspecified: Secondary | ICD-10-CM | POA: Diagnosis not present

## 2019-01-15 DIAGNOSIS — R7303 Prediabetes: Secondary | ICD-10-CM | POA: Diagnosis not present

## 2019-01-15 DIAGNOSIS — E538 Deficiency of other specified B group vitamins: Secondary | ICD-10-CM | POA: Diagnosis not present

## 2019-01-19 DIAGNOSIS — Z85828 Personal history of other malignant neoplasm of skin: Secondary | ICD-10-CM | POA: Diagnosis not present

## 2019-01-19 DIAGNOSIS — C44319 Basal cell carcinoma of skin of other parts of face: Secondary | ICD-10-CM | POA: Diagnosis not present

## 2019-01-22 DIAGNOSIS — K5900 Constipation, unspecified: Secondary | ICD-10-CM | POA: Diagnosis not present

## 2019-01-22 DIAGNOSIS — I10 Essential (primary) hypertension: Secondary | ICD-10-CM | POA: Diagnosis not present

## 2019-02-20 DIAGNOSIS — I10 Essential (primary) hypertension: Secondary | ICD-10-CM | POA: Diagnosis not present

## 2019-02-20 DIAGNOSIS — L720 Epidermal cyst: Secondary | ICD-10-CM | POA: Diagnosis not present

## 2019-02-20 DIAGNOSIS — N529 Male erectile dysfunction, unspecified: Secondary | ICD-10-CM | POA: Diagnosis not present

## 2019-05-22 DIAGNOSIS — L821 Other seborrheic keratosis: Secondary | ICD-10-CM | POA: Diagnosis not present

## 2019-05-22 DIAGNOSIS — B351 Tinea unguium: Secondary | ICD-10-CM | POA: Diagnosis not present

## 2019-05-22 DIAGNOSIS — Z85828 Personal history of other malignant neoplasm of skin: Secondary | ICD-10-CM | POA: Diagnosis not present

## 2019-06-19 DIAGNOSIS — Z0184 Encounter for antibody response examination: Secondary | ICD-10-CM | POA: Diagnosis not present

## 2019-06-19 DIAGNOSIS — G0491 Myelitis, unspecified: Secondary | ICD-10-CM | POA: Diagnosis not present

## 2019-06-23 DIAGNOSIS — I10 Essential (primary) hypertension: Secondary | ICD-10-CM | POA: Insufficient documentation

## 2019-07-03 DIAGNOSIS — N529 Male erectile dysfunction, unspecified: Secondary | ICD-10-CM | POA: Diagnosis not present

## 2019-07-03 DIAGNOSIS — R7303 Prediabetes: Secondary | ICD-10-CM | POA: Diagnosis not present

## 2019-07-03 DIAGNOSIS — I10 Essential (primary) hypertension: Secondary | ICD-10-CM | POA: Diagnosis not present

## 2019-07-03 DIAGNOSIS — Z7982 Long term (current) use of aspirin: Secondary | ICD-10-CM | POA: Diagnosis not present

## 2019-07-03 DIAGNOSIS — I2510 Atherosclerotic heart disease of native coronary artery without angina pectoris: Secondary | ICD-10-CM | POA: Diagnosis not present

## 2019-07-03 DIAGNOSIS — G373 Acute transverse myelitis in demyelinating disease of central nervous system: Secondary | ICD-10-CM | POA: Diagnosis not present

## 2019-07-03 DIAGNOSIS — B353 Tinea pedis: Secondary | ICD-10-CM | POA: Diagnosis not present

## 2019-07-03 DIAGNOSIS — E039 Hypothyroidism, unspecified: Secondary | ICD-10-CM | POA: Diagnosis not present

## 2019-07-03 DIAGNOSIS — N183 Chronic kidney disease, stage 3 (moderate): Secondary | ICD-10-CM | POA: Diagnosis not present

## 2019-07-03 DIAGNOSIS — E7800 Pure hypercholesterolemia, unspecified: Secondary | ICD-10-CM | POA: Diagnosis not present

## 2019-07-03 DIAGNOSIS — M19041 Primary osteoarthritis, right hand: Secondary | ICD-10-CM | POA: Diagnosis not present

## 2019-07-03 DIAGNOSIS — E782 Mixed hyperlipidemia: Secondary | ICD-10-CM | POA: Diagnosis not present

## 2019-08-22 DIAGNOSIS — H0100A Unspecified blepharitis right eye, upper and lower eyelids: Secondary | ICD-10-CM | POA: Diagnosis not present

## 2019-08-22 DIAGNOSIS — H25813 Combined forms of age-related cataract, bilateral: Secondary | ICD-10-CM | POA: Diagnosis not present

## 2019-08-22 DIAGNOSIS — H5203 Hypermetropia, bilateral: Secondary | ICD-10-CM | POA: Diagnosis not present

## 2019-08-22 DIAGNOSIS — H0100B Unspecified blepharitis left eye, upper and lower eyelids: Secondary | ICD-10-CM | POA: Diagnosis not present

## 2019-08-28 DIAGNOSIS — N183 Chronic kidney disease, stage 3 (moderate): Secondary | ICD-10-CM | POA: Diagnosis not present

## 2019-08-31 DIAGNOSIS — N289 Disorder of kidney and ureter, unspecified: Secondary | ICD-10-CM | POA: Diagnosis not present

## 2019-08-31 DIAGNOSIS — N183 Chronic kidney disease, stage 3 (moderate): Secondary | ICD-10-CM | POA: Diagnosis not present

## 2019-08-31 DIAGNOSIS — I10 Essential (primary) hypertension: Secondary | ICD-10-CM | POA: Diagnosis not present

## 2019-08-31 DIAGNOSIS — Z23 Encounter for immunization: Secondary | ICD-10-CM | POA: Diagnosis not present

## 2019-09-06 DIAGNOSIS — N289 Disorder of kidney and ureter, unspecified: Secondary | ICD-10-CM | POA: Diagnosis not present

## 2020-01-10 ENCOUNTER — Ambulatory Visit: Payer: Commercial Managed Care - PPO

## 2020-01-18 ENCOUNTER — Ambulatory Visit: Payer: Commercial Managed Care - PPO

## 2020-01-21 ENCOUNTER — Ambulatory Visit: Payer: Commercial Managed Care - PPO

## 2020-03-18 DIAGNOSIS — R0602 Shortness of breath: Secondary | ICD-10-CM | POA: Diagnosis not present

## 2020-03-18 DIAGNOSIS — R0600 Dyspnea, unspecified: Secondary | ICD-10-CM | POA: Diagnosis not present

## 2020-03-18 DIAGNOSIS — R05 Cough: Secondary | ICD-10-CM | POA: Diagnosis not present

## 2020-03-20 DIAGNOSIS — E039 Hypothyroidism, unspecified: Secondary | ICD-10-CM | POA: Diagnosis not present

## 2020-03-20 DIAGNOSIS — Z87891 Personal history of nicotine dependence: Secondary | ICD-10-CM | POA: Diagnosis not present

## 2020-03-20 DIAGNOSIS — N289 Disorder of kidney and ureter, unspecified: Secondary | ICD-10-CM | POA: Diagnosis not present

## 2020-03-20 DIAGNOSIS — N183 Chronic kidney disease, stage 3 unspecified: Secondary | ICD-10-CM | POA: Diagnosis not present

## 2020-03-20 NOTE — Progress Notes (Deleted)
Cardiology Office Note   Date:  03/20/2020   ID:  Alec Snyder, DOB 1941/10/03, MRN [id number]  PCP:  Deland Pretty, MD  Cardiologist:   Sidharth Leverette Martinique, MD   No chief complaint on file.     History of Present Illness: Alec Snyder is a 79 y.o. male who is seen at the request of Dr [name] for evaluation of decreased exercise tolerance. He has a history of HLD.     Past Medical History:  Diagnosis Date  . Arthritis    in hands  . Cancer [facility])    melanoma/ on hand and leg  . Hemorrhoids    occasional  . Hx of adenomatous colonic polyps 06/30/2017  . Hyperlipemia   . Hypothyroid   . Inguinal hernia    bilateral  . Joint pain    minor  . Transverse myelitis ([place])   . Wears glasses     Past Surgical History:  Procedure Laterality Date  . BLEPHAROPLASTY Bilateral 2016  . COLONOSCOPY    . HERNIA REPAIR  2012   BIH  . MELANOMA EXCISION       Current Outpatient Medications  Medication Sig Dispense Refill  . acetaminophen (TYLENOL) 500 MG tablet Take 500 mg by mouth every 8 (eight) hours as needed.    Marland Kitchen aspirin 81 MG tablet Take 81 mg by mouth daily.      . Cyanocobalamin (VITAMIN B-12) 5000 MCG SUBL Place under the tongue.    . diclofenac sodium (VOLTAREN) 1 % GEL Apply topically 4 (four) times daily.    . Glucosamine-Chondroit-Vit C-Mn (GLUCOSAMINE 1500 COMPLEX) CAPS Take by mouth. 3 tabs daily    . ibuprofen (ADVIL,MOTRIN) 200 MG tablet Take 200 mg by mouth as needed.     Marland Kitchen levothyroxine (SYNTHROID, LEVOTHROID) 125 MCG tablet Take 125 mcg by mouth daily.      . Omega-3 Fatty Acids (FISH OIL) 1200 MG CAPS Take by mouth 3 (three) times daily.    . pantoprazole (PROTONIX) 40 MG tablet Take 40 mg by mouth 2 (two) times daily.  1  . rosuvastatin (CRESTOR) 20 MG tablet Take 20 mg by mouth daily.    . sildenafil (VIAGRA) 100 MG tablet Take 100 mg by mouth daily as needed for erectile dysfunction.    . traZODone (DESYREL) 50 MG tablet 1 & 1/2 TABLETS AT BEDTIME ONCE A DAY  ORALLY 90 DAYS  3   Current Facility-Administered Medications  Medication Dose Route Frequency Provider Last Rate Last Admin  . 0.9 %  sodium chloride infusion  500 mL Intravenous Continuous Gatha Mayer, MD        Allergies:   Patient has no known allergies.    Social History:  The patient  reports that he quit smoking about 45 years ago. His smoking use included cigarettes. He has a 4.50 pack-year smoking history. He has never used smokeless tobacco. He reports current alcohol use of about 2.0 standard drinks of alcohol per week. He reports that he does not use drugs.   Family History:  The patient's ***family history includes Arthritis in his brother and sister; Diabetes in his mother; Kidney failure in his father.    ROS:  Please see the history of present illness.   Otherwise, review of systems are positive for {NONE DEFAULTED:18576::"none"}.   All other systems are reviewed and negative.    PHYSICAL EXAM: VS:  There were no vitals taken for this visit. , BMI There is no height or weight on  file to calculate BMI. GEN: Well nourished, well developed, in no acute distress  HEENT: normal  Neck: no JVD, carotid bruits, or masses Cardiac: ***RRR; no murmurs, rubs, or gallops,no edema  Respiratory:  clear to auscultation bilaterally, normal work of breathing GI: soft, nontender, nondistended, + BS MS: no deformity or atrophy  Skin: warm and dry, no rash Neuro:  Strength and sensation are intact Psych: euthymic mood, full affect   EKG:  EKG {ACTION; IS/IS [id number] ordered today. The ekg ordered today demonstrates ***   Recent Labs: No results found for requested labs within last 8760 hours.    Lipid Panel No results found for: CHOL, TRIG, HDL, CHOLHDL, VLDL, LDLCALC, LDLDIRECT    Wt Readings from Last 3 Encounters:  06/21/17 167 lb (75.8 kg)  06/14/17 167 lb 3.2 oz (75.8 kg)  05/24/17 164 lb 12.8 oz (74.8 kg)      Other studies Reviewed: Additional studies/  records that were reviewed today include:   Labs dated 06/19/19: cholesterol 116, triglycerides 162, HDL 43, LDL 41. A1c 5.7%. creatinine 1.42. calcium 11.0, otherwise CMET normal. Normal CRP. Normal iron studies. Hgb 12.7.    ASSESSMENT AND PLAN:  1.  ***   Current medicines are reviewed at length with the patient today.  The patient {ACTIONS; HAS/DOES NOT HAVE:19233} concerns regarding medicines.  The following changes have been made:  {PLAN; NO CHANGE:13088:s}  Labs/ tests ordered today include: *** No orders of the defined types were placed in this encounter.    Disposition:   FU with *** in {gen number [id number] {Days to years:10300}  Signed, Tian Davison Martinique, MD  03/20/2020 8:12 PM    [facility] [address] [place], [place], [address] Phone [phone], Fax [phone]

## 2020-03-21 ENCOUNTER — Ambulatory Visit: Payer: Medicare Other | Admitting: Cardiology

## 2020-04-10 ENCOUNTER — Other Ambulatory Visit: Payer: Self-pay

## 2020-04-10 ENCOUNTER — Ambulatory Visit (INDEPENDENT_AMBULATORY_CARE_PROVIDER_SITE_OTHER): Payer: Medicare Other | Admitting: Emergency Medicine

## 2020-04-10 ENCOUNTER — Encounter: Payer: Self-pay | Admitting: Cardiovascular Disease

## 2020-04-10 ENCOUNTER — Encounter: Payer: Self-pay | Admitting: Emergency Medicine

## 2020-04-10 DIAGNOSIS — K219 Gastro-esophageal reflux disease without esophagitis: Secondary | ICD-10-CM | POA: Diagnosis not present

## 2020-04-10 DIAGNOSIS — R0609 Other forms of dyspnea: Secondary | ICD-10-CM | POA: Diagnosis not present

## 2020-04-10 DIAGNOSIS — E875 Hyperkalemia: Secondary | ICD-10-CM | POA: Diagnosis not present

## 2020-04-10 DIAGNOSIS — I10 Essential (primary) hypertension: Secondary | ICD-10-CM | POA: Diagnosis not present

## 2020-04-10 DIAGNOSIS — R0600 Dyspnea, unspecified: Secondary | ICD-10-CM

## 2020-04-10 DIAGNOSIS — N529 Male erectile dysfunction, unspecified: Secondary | ICD-10-CM | POA: Diagnosis not present

## 2020-04-10 NOTE — Progress Notes (Signed)
Subjective:    Patient ID: Alec Snyder, male    DOB: May 28, 1941, 79 y.o.   MRN: [id number]  HPI 79 year old former smoker (3-4 pack years) with a history of transverse myelitis, melanoma, HTN, hyperlipidemia, hypothyroidism.  He has noticed SOB over the last year, able to walk on flat ground or on the treadmill. Especially noticeable when he climbs stairs. No associated CP. He was able to play golf this week, used a cart. No wheeze. He feels some light-headedness in the am when he goes from laying to sitting to standing  He has also been evaluated for cough, has had his HTN meds adjusted, ? ACE-I. The cough has improved, not completely gone.   He is planning to have a TTE today, to see Dr [name] 4/30. Says he had a repeat CXR at Dr Pennie Banter - not available to me.   Last CXR 08/17/2017 reviewed by me, shows some DJD but no pulm abnormalities.   Review of Systems As per HPI   Past Medical History:  Diagnosis Date  . Arthritis    in hands  . Cancer [facility])    melanoma/ on hand and leg  . Hemorrhoids    occasional  . Hx of adenomatous colonic polyps 06/30/2017  . Hyperlipemia   . Hypothyroid   . Inguinal hernia    bilateral  . Joint pain    minor  . Transverse myelitis ([place])   . Wears glasses      Family History  Problem Relation Age of Onset  . Kidney failure Father   . Diabetes Mother   . Arthritis Brother   . Arthritis Sister      Social History   Socioeconomic History  . Marital status: Married    Spouse name: [name]  . Number of children: 2  . Years of education: 72  . Highest education level: Not on file  Occupational History    Comment: retired, Counselling psychologist transport  Tobacco Use  . Smoking status: Former Smoker    Packs/day: 1.50    Years: 3.00    Pack years: 4.50    Types: Cigarettes    Quit date: 05/14/1974    Years since quitting: 45.9  . Smokeless tobacco: Never Used  . Tobacco comment: occasional cigar.  Substance and Sexual Activity  . Alcohol use: Yes     Alcohol/week: 2.0 standard drinks    Types: 2 Standard drinks or equivalent per week    Comment: occas 2-4 beers  weekly  . Drug use: No  . Sexual activity: Not on file  Other Topics Concern  . Not on file  Social History Narrative   Lives with spouse   Caffeine use- coffee  3-4 cups daily   Social Determinants of Health   Financial Resource Strain:   . Difficulty of Paying Living Expenses:   Food Insecurity:   . Worried About Charity fundraiser in the Last Year:   . Arboriculturist in the Last Year:   Transportation Needs:   . Film/video editor (Medical):   Marland Kitchen Lack of Transportation (Non-Medical):   Physical Activity:   . Days of Exercise per Week:   . Minutes of Exercise per Session:   Stress:   . Feeling of Stress :   Social Connections:   . Frequency of Communication with Friends and Family:   . Frequency of Social Gatherings with Friends and Family:   . Attends Religious Services:   . Active Member of Clubs or Organizations:   .  Attends Archivist Meetings:   Marland Kitchen Marital Status:   Intimate Partner Violence:   . Fear of Current or Ex-Partner:   . Emotionally Abused:   Marland Kitchen Physically Abused:   . Sexually Abused:     From [place], has also lived in [place].  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  No military, no inhaled exposures.   No Known Allergies   Outpatient Medications Prior to Visit  Medication Sig Dispense Refill  . Cyanocobalamin (VITAMIN B-12) 5000 MCG SUBL Place under the tongue.    . Glucosamine-Chondroit-Vit C-Mn (GLUCOSAMINE 1500 COMPLEX) CAPS Take by mouth. 3 tabs daily    . levothyroxine (SYNTHROID, LEVOTHROID) 125 MCG tablet Take 125 mcg by mouth daily.      . Omega-3 Fatty Acids (FISH OIL) 1200 MG CAPS Take by mouth 3 (three) times daily.    . pantoprazole (PROTONIX) 40 MG tablet Take 40 mg by mouth 2 (two) times daily.  1  . rosuvastatin (CRESTOR) 20 MG tablet Take 20 mg by mouth daily.    . sildenafil (VIAGRA) 100 MG  tablet Take 100 mg by mouth daily as needed for erectile dysfunction.    . traZODone (DESYREL) 50 MG tablet 1 & 1/2 TABLETS AT BEDTIME ONCE A DAY ORALLY 90 DAYS  3  . acetaminophen (TYLENOL) 500 MG tablet Take 500 mg by mouth every 8 (eight) hours as needed.    Marland Kitchen aspirin 81 MG tablet Take 81 mg by mouth daily.      . diclofenac sodium (VOLTAREN) 1 % GEL Apply topically 4 (four) times daily.    Marland Kitchen ibuprofen (ADVIL,MOTRIN) 200 MG tablet Take 200 mg by mouth as needed.      Facility-Administered Medications Prior to Visit  Medication Dose Route Frequency Provider Last Rate Last Admin  . 0.9 %  sodium chloride infusion  500 mL Intravenous Continuous Gatha Mayer, MD            Objective:   Physical Exam Vitals:   04/10/20 0922  BP: 108/70  Pulse: 76  Temp: (!) 97.2 F (36.2 C)  TempSrc: Temporal  SpO2: 96%  Weight: 172 lb 9.6 oz (78.3 kg)  Height: 5\' 8"  (1.727 m)   Gen: Pleasant, well-nourished, in no distress,  normal affect  ENT: No lesions,  mouth clear,  oropharynx clear, no postnasal drip  Neck: No JVD, no stridor but prominent UA noise on a forced exp  Lungs: No use of accessory muscles, no crackles or wheezing on normal respiration, no wheeze on forced expiration  Cardiovascular: RRR, heart sounds normal, no murmur or gallops, no peripheral edema  Musculoskeletal: No deformities, no cyanosis or clubbing  Neuro: alert, awake, non focal  Skin: Warm, no lesions or rash, some bruising on R>L hands        Assessment & Plan:  Exertional dyspnea Etiology unclear.  Could represent some degree of deconditioning.  His weight has been stable.  Minimal tobacco history so low likelihood for obstruction.  Cardiology evaluation is underway.  Consider restrictive disease, possibly impacted by transverse myelitis (which has been quiescent).  No evidence of impaired diaphragmatic excursion on chest x-ray from 2018, I will try to obtain the more recent film from Dr. Pennie Banter office.   He needs pulmonary function testing and we will perform as next visit, review together.  Baltazar Apo, MD, PhD 04/10/2020, 9:55 AM [facility] [phone] or if no answer [phone]

## 2020-04-10 NOTE — Assessment & Plan Note (Signed)
Etiology unclear.  Could represent some degree of deconditioning.  His weight has been stable.  Minimal tobacco history so low likelihood for obstruction.  Cardiology evaluation is underway.  Consider restrictive disease, possibly impacted by transverse myelitis (which has been quiescent).  No evidence of impaired diaphragmatic excursion on chest x-ray from 2018, I will try to obtain the more recent film from Dr. Pennie Banter office.  He needs pulmonary function testing and we will perform as next visit, review together.

## 2020-04-10 NOTE — Patient Instructions (Signed)
We will obtain CXR results from Dr Pennie Banter office.  Follow with Dr. Lamonte Sakai next available with full pulmonary function testing on the same day.

## 2020-04-11 ENCOUNTER — Encounter: Payer: Self-pay | Admitting: Cardiovascular Disease

## 2020-04-11 ENCOUNTER — Ambulatory Visit (INDEPENDENT_AMBULATORY_CARE_PROVIDER_SITE_OTHER): Payer: Medicare Other | Admitting: Cardiovascular Disease

## 2020-04-11 DIAGNOSIS — R0609 Other forms of dyspnea: Secondary | ICD-10-CM

## 2020-04-11 DIAGNOSIS — R0600 Dyspnea, unspecified: Secondary | ICD-10-CM | POA: Diagnosis not present

## 2020-04-11 DIAGNOSIS — E782 Mixed hyperlipidemia: Secondary | ICD-10-CM | POA: Diagnosis not present

## 2020-04-11 MED ORDER — METOPROLOL TARTRATE 50 MG PO TABS: ORAL_TABLET | ORAL | 0 refills | Status: DC

## 2020-04-11 NOTE — Patient Instructions (Addendum)
Medication Instructions:  Take Metoprolol 50 mg two hours before CT, when scheduled *If you need a refill on your cardiac medications before your next appointment, please call your pharmacy*   Testing/Procedures: Your physician has requested that you have cardiac CT. Cardiac computed tomography (CT) is a painless test that uses an x-ray machine to take clear, detailed pictures of your heart. For further information please visit [email or web address]. Please follow instruction sheet as given.   Follow-Up: At [facility], you and your health needs are our priority.  As part of our continuing mission to provide you with exceptional heart care, we have created designated Provider Care Teams.  These Care Teams include your primary Cardiologist (physician) and Advanced Practice Providers (APPs -  Physician Assistants and Nurse Practitioners) who all work together to provide you with the care you need, when you need it.  We recommend signing up for the patient portal called "MyChart".  Sign up information is provided on this After Visit Summary.  MyChart is used to connect with patients for Virtual Visits (Telemedicine).  Patients are able to view lab/test results, encounter notes, upcoming appointments, etc.  Non-urgent messages can be sent to your provider as well.   To learn more about what you can do with MyChart, go to [email or web address].    Your next appointment:   3 month(s)  The format for your next appointment:   In Person  Provider:   Quay Burow, MD   Other Instructions Your cardiac CT will be scheduled at one of the below locations:   [facility] [address] [place], [place] [address] [phone]  If scheduled at [facility], please arrive at the [facility] main entrance of [facility] 30 minutes prior to test start time. Proceed to the [facility] Radiology Department (first floor) to check-in and test prep.   Please follow these  instructions carefully (unless otherwise directed):  Hold all erectile dysfunction medications at least 3 days (72 hrs) prior to test.  On the Night Before the Test: . Be sure to Drink plenty of water. . Do not consume any caffeinated/decaffeinated beverages or chocolate 12 hours prior to your test. . Do not take any antihistamines 12 hours prior to your test. . If you take Metformin do not take 24 hours prior to test.  On the Day of the Test: . Drink plenty of water. Do not drink any water within one hour of the test. . Do not eat any food 4 hours prior to the test. . You may take your regular medications prior to the test.  . Take metoprolol (Lopressor) two hours prior to test. . HOLD Furosemide/Hydrochlorothiazide morning of the test. . FEMALES- please wear underwire-free bra if available       After the Test: . Drink plenty of water. . After receiving IV contrast, you may experience a mild flushed feeling. This is normal. . On occasion, you may experience a mild rash up to 24 hours after the test. This is not dangerous. If this occurs, you can take Benadryl 25 mg and increase your fluid intake. . If you experience trouble breathing, this can be serious. If it is severe call 911 IMMEDIATELY. If it is mild, please call our office. . If you take any of these medications: Glipizide/Metformin, Avandament, Glucavance, please do not take 48 hours after completing test unless otherwise instructed.   Once we have confirmed authorization from your insurance company, we will call you to set up a  date and time for your test.   For non-scheduling related questions, please contact the cardiac imaging nurse navigator should you have any questions/concerns: Marchia Bond, RN Navigator Cardiac Imaging [facility] [phone] office  For scheduling needs, including cancellations and rescheduling, please call [phone].

## 2020-04-11 NOTE — Assessment & Plan Note (Signed)
History of hyperlipidemia on rosuvastatin followed by [name]

## 2020-04-11 NOTE — Assessment & Plan Note (Signed)
Recent onset of dyspnea on exertion.  He is fairly active and this has not been a complaint in the past.  He is a non-smoker.  He did have a chest CT done 2017 that showed dense coronary calcification.  [name] did order 2D echocardiogram recently to further evaluate this.  I am going to order a coronary CTA as well.

## 2020-04-11 NOTE — Progress Notes (Signed)
04/11/2020 Alec Snyder   1941-04-18  [id number]  Primary Physician Deland Pretty, MD Primary Cardiologist: Lorretta Harp MD Lupe Carney, [place]  HPI:  Alec Snyder is a 79 y.o.  married Caucasian male father of 2 children, and father of 4 grandchildren referred through the courtesy of Dr. Deland Pretty for cardiovascular evaluation.  I last saw him in the office 05/14/2014.  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  His cardiac risk factor profile is only remarkable for mild medically treated hyperlipidemia. He did does not smoke nor is he diabetic. There is no family history. He has never had a heart or stroke. He denies chest pain or shortness of breath. He does exercise fairly frequently on the treadmill.  He was referred back to me by [name] for newly recognized dyspnea on exertion.  He denies chest pain.  He did have a chest CT performed 06/25/2016 that showed dense coronary calcification and aortic calcification as well.  [name] recently performed a 2D echo with results are unavailable to me at this time.   Current Meds  Medication Sig  . Cholecalciferol 25 MCG (1000 UT) capsule Take by mouth.  . Cyanocobalamin (VITAMIN B-12) 5000 MCG SUBL Place under the tongue.  Marland Kitchen olmesartan-hydrochlorothiazide (BENICAR HCT) 40-25 MG tablet Take by mouth.  . rosuvastatin (CRESTOR) 20 MG tablet Take 20 mg by mouth daily.  . sildenafil (VIAGRA) 100 MG tablet Take 100 mg by mouth daily as needed for erectile dysfunction.  Marland Kitchen spironolactone (ALDACTONE) 25 MG tablet Take by mouth.  . traZODone (DESYREL) 50 MG tablet 1 & 1/2 TABLETS AT BEDTIME ONCE A DAY ORALLY 90 DAYS   Current Facility-Administered Medications for the 04/11/20 encounter (Office Visit) with Lorretta Harp, MD  Medication  . 0.9 %  sodium chloride infusion     No Known Allergies  Social  History   Socioeconomic History  . Marital status: Married    Spouse name: [name]  . Number of children: 2  . Years of education: 77  . Highest education level: Not on file  Occupational History    Comment: retired, Counselling psychologist transport  Tobacco Use  . Smoking status: Former Smoker    Packs/day: 1.50    Years: 3.00    Pack years: 4.50    Types: Cigarettes    Quit date: 05/14/1974    Years since quitting: 45.9  . Smokeless tobacco: Never Used  . Tobacco comment: occasional cigar.  Substance and Sexual Activity  . Alcohol use: Yes    Alcohol/week: 2.0 standard drinks    Types: 2 Standard drinks or equivalent per week    Comment: occas 2-4 beers  weekly  . Drug use: No  . Sexual activity: Not on file  Other Topics Concern  . Not on file  Social History Narrative   Lives with spouse   Caffeine use- coffee  3-4 cups daily   Social Determinants of Health   Financial Resource Strain:   . Difficulty of Paying Living Expenses:   Food Insecurity:   . Worried About Charity fundraiser in the Last Year:   . Arboriculturist in the Last Year:   Transportation Needs:   . Film/video editor (Medical):   Marland Kitchen Lack of Transportation (Non-Medical):   Physical Activity:   . Days of Exercise per  Week:   . Minutes of Exercise per Session:   Stress:   . Feeling of Stress :   Social Connections:   . Frequency of Communication with Friends and Family:   . Frequency of Social Gatherings with Friends and Family:   . Attends Religious Services:   . Active Member of Clubs or Organizations:   . Attends Archivist Meetings:   Marland Kitchen Marital Status:   Intimate Partner Violence:   . Fear of Current or Ex-Partner:   . Emotionally Abused:   Marland Kitchen Physically Abused:   . Sexually Abused:      Review of Systems: General: negative for chills, fever, night sweats or weight changes.  Cardiovascular: negative for chest pain, dyspnea on exertion, edema, orthopnea, palpitations, paroxysmal nocturnal  dyspnea or shortness of breath Dermatological: negative for rash Respiratory: negative for cough or wheezing Urologic: negative for hematuria Abdominal: negative for nausea, vomiting, diarrhea, bright red blood per rectum, melena, or hematemesis Neurologic: negative for visual changes, syncope, or dizziness All other systems reviewed and are otherwise negative except as noted above.    Blood pressure 118/62, pulse 65, height 5\' 8"  (1.727 m), weight 173 lb (78.5 kg), SpO2 96 %.  General appearance: alert and no distress Neck: no adenopathy, no carotid bruit, no JVD, supple, symmetrical, trachea midline and thyroid not enlarged, symmetric, no tenderness/mass/nodules Lungs: clear to auscultation bilaterally Heart: regular rate and rhythm, S1, S2 normal, no murmur, click, rub or gallop Extremities: extremities normal, atraumatic, no cyanosis or edema Pulses: 2+ and symmetric Skin: Skin color, texture, turgor normal. No rashes or lesions Neurologic: Alert and oriented X 3, normal strength and tone. Normal symmetric reflexes. Normal coordination and gait  EKG sinus rhythm at 65 without ST or T wave changes.  I personally reviewed this EKG.  ASSESSMENT AND PLAN:   Hyperlipidemia History of hyperlipidemia on rosuvastatin followed by [name]  Exertional dyspnea Recent onset of dyspnea on exertion.  He is fairly active and this has not been a complaint in the past.  He is a non-smoker.  He did have a chest CT done 2017 that showed dense coronary calcification.  [name] did order 2D echocardiogram recently to further evaluate this.  I am going to order a coronary CTA as well.      Lorretta Harp MD FACP,FACC,FAHA, [facility] 04/11/2020 10:52 AM

## 2020-04-30 ENCOUNTER — Institutional Professional Consult (permissible substitution): Payer: Self-pay | Admitting: Internal Medicine

## 2020-05-05 ENCOUNTER — Telehealth (HOSPITAL_COMMUNITY): Payer: Self-pay | Admitting: *Deleted

## 2020-05-05 NOTE — Telephone Encounter (Signed)
Attempted to call patient regarding upcoming cardiac CT appointment. Pt eating lunch and will call back.  Burley Saver RN Navigator Cardiac Imaging [facility] [phone] Office [phone] Cell

## 2020-05-05 NOTE — Telephone Encounter (Signed)
Pt returning call regarding upcoming cardiac imaging study; pt verbalizes understanding of appt date/time, parking situation and where to check in, pre-test NPO status and medications ordered, and verified current allergies; name and call back number provided for further questions should they arise  Merle Tai RN Navigator Cardiac Imaging [facility] [phone] office [phone] cell  

## 2020-05-06 ENCOUNTER — Other Ambulatory Visit: Payer: Self-pay

## 2020-05-06 ENCOUNTER — Ambulatory Visit (HOSPITAL_COMMUNITY)
Admission: RE | Admit: 2020-05-06 | Discharge: 2020-05-06 | Disposition: A | Discharge status: Home / Self Care | Payer: Medicare Other | Source: Ambulatory Visit | Attending: Cardiovascular Disease | Admitting: Cardiovascular Disease

## 2020-05-06 ENCOUNTER — Telehealth: Payer: Self-pay

## 2020-05-06 DIAGNOSIS — I2510 Atherosclerotic heart disease of native coronary artery without angina pectoris: Secondary | ICD-10-CM | POA: Insufficient documentation

## 2020-05-06 DIAGNOSIS — R0609 Other forms of dyspnea: Secondary | ICD-10-CM

## 2020-05-06 DIAGNOSIS — R0600 Dyspnea, unspecified: Secondary | ICD-10-CM | POA: Insufficient documentation

## 2020-05-06 DIAGNOSIS — R918 Other nonspecific abnormal finding of lung field: Secondary | ICD-10-CM | POA: Insufficient documentation

## 2020-05-06 DIAGNOSIS — I700 Atherosclerosis of aorta: Secondary | ICD-10-CM | POA: Insufficient documentation

## 2020-05-06 DIAGNOSIS — R0602 Shortness of breath: Secondary | ICD-10-CM

## 2020-05-06 MED ORDER — IOHEXOL 350 MG/ML SOLN
100.0000 mL | Freq: Once | INTRAVENOUS | Status: AC | PRN
  Administered 2020-05-06: 100 mL via INTRAVENOUS

## 2020-05-06 MED ORDER — NITROGLYCERIN 0.4 MG SL SUBL
0.8000 mg | SUBLINGUAL_TABLET | Freq: Once | SUBLINGUAL | Status: AC
  Administered 2020-05-06: 0.8 mg via SUBLINGUAL

## 2020-05-06 MED ORDER — NITROGLYCERIN 0.4 MG SL SUBL
SUBLINGUAL_TABLET | SUBLINGUAL | Status: AC
  Filled 2020-05-06: qty 2

## 2020-05-06 NOTE — Telephone Encounter (Signed)
Opened in error

## 2020-05-08 ENCOUNTER — Telehealth: Payer: Self-pay | Admitting: Cardiovascular Disease

## 2020-05-08 NOTE — Telephone Encounter (Signed)
° °  Went to chart to check who called pt. Transferred to [employer]

## 2020-05-09 ENCOUNTER — Ambulatory Visit: Payer: Medicare Other | Admitting: Cardiovascular Disease

## 2020-05-13 ENCOUNTER — Encounter: Payer: Self-pay | Admitting: Cardiovascular Disease

## 2020-05-13 ENCOUNTER — Ambulatory Visit (INDEPENDENT_AMBULATORY_CARE_PROVIDER_SITE_OTHER): Payer: Medicare Other | Admitting: Cardiovascular Disease

## 2020-05-13 ENCOUNTER — Other Ambulatory Visit: Payer: Self-pay

## 2020-05-13 VITALS — BP 134/80 | HR 68 | Ht 68.0 in | Wt 176.4 lb

## 2020-05-13 DIAGNOSIS — R0600 Dyspnea, unspecified: Secondary | ICD-10-CM | POA: Diagnosis not present

## 2020-05-13 DIAGNOSIS — R931 Abnormal findings on diagnostic imaging of heart and coronary circulation: Secondary | ICD-10-CM | POA: Diagnosis not present

## 2020-05-13 DIAGNOSIS — I2510 Atherosclerotic heart disease of native coronary artery without angina pectoris: Secondary | ICD-10-CM

## 2020-05-13 NOTE — Patient Instructions (Signed)
Medication Instructions:  Your physician recommends that you continue on your current medications as directed. Please refer to the Current Medication list given to you today.  *If you need a refill on your cardiac medications before your next appointment, please call your pharmacy*   Testing/Procedures:  Chest CT to be scheduled   Dr. [name] has ordered a Lexiscan Myocardial Perfusion Imaging Study.  Please arrive 15 minutes prior to your appointment time for registration and insurance purposes.   The test will take approximately 3 to 4 hours to complete; you may bring reading material.  If someone comes with you to your appointment, they will need to remain in the main lobby due to limited space in the testing area. **If you are pregnant or breastfeeding, please notify the nuclear lab prior to your appointment**   How to prepare for your Myocardial Perfusion Test:  Do not eat or drink 3 hours prior to your test, except you may have water.  Do not consume products containing caffeine (regular or decaffeinated) 12 hours prior to your test. (ex: coffee, chocolate, sodas, tea).  Do wear comfortable clothes (no dresses or overalls) and walking shoes, tennis shoes preferred (No heels or open toe shoes are allowed).  Do NOT wear cologne, perfume, aftershave, or lotions (deodorant is allowed).  If you use an inhaler, use it the AM of your test and bring it with you.   If you use a nebulizer, use it the AM of your test.   If these instructions are not followed, your test will have to be rescheduled.    Follow-Up: At [facility], you and your health needs are our priority.  As part of our continuing mission to provide you with exceptional heart care, we have created designated Provider Care Teams.  These Care Teams include your primary Cardiologist (physician) and Advanced Practice Providers (APPs -  Physician Assistants and Nurse Practitioners) who all work together to provide you with  the care you need, when you need it.  We recommend signing up for the patient portal called "MyChart".  Sign up information is provided on this After Visit Summary.  MyChart is used to connect with patients for Virtual Visits (Telemedicine).  Patients are able to view lab/test results, encounter notes, upcoming appointments, etc.  Non-urgent messages can be sent to your provider as well.   To learn more about what you can do with MyChart, go to [email or web address].    Your next appointment:   6 month(s)  The format for your next appointment:   In Person  Provider:   You may see Dr. [name] or one of the following Advanced Practice Providers on your designated Care Team:    Kerin Ransom, PA-C  [place], [place]  Coletta Memos, [place]    Other Instructions

## 2020-05-13 NOTE — Progress Notes (Addendum)
Alec Snyder returns today for follow-up of his coronary CTA performed 05/06/2020.  The chest CT showed groundglass attenuation of the lower lobes of the lung which was nonspecific but the possibility of interstitial lung disease was raised.  He also had small less than 5 mm nodules on the right side.  High-resolution CT was recommended.  His coronary CTA showed significant two-vessel disease with a coronary calcium score of 1345 and calcified plaque in his RCA and LAD.  He denies chest pain.  He is minimally symptomatic with dyspnea.  I do not think his symptoms warrant going directly to heart cath and rather recommend a Lexiscan Myoview stress test to assess whether or not he has any inducible ischemia.  He did have a 2D echocardiogram performed 04/10/2020 revealing normal LV systolic function without significant valvular abnormalities.  Lorretta Harp, M.D., [place], [facility], [place] [address] [place], [place]  [address]  [phone] 05/13/2020 3:11 PM

## 2020-05-15 ENCOUNTER — Telehealth (HOSPITAL_COMMUNITY): Payer: Self-pay

## 2020-05-15 NOTE — Telephone Encounter (Signed)
Encounter complete. 

## 2020-05-20 ENCOUNTER — Ambulatory Visit (HOSPITAL_COMMUNITY)
Admission: RE | Admit: 2020-05-20 | Discharge: 2020-05-20 | Disposition: A | Discharge status: Home / Self Care | Payer: Medicare Other | Source: Ambulatory Visit | Attending: Cardiovascular Disease | Admitting: Cardiovascular Disease

## 2020-05-20 ENCOUNTER — Other Ambulatory Visit: Payer: Self-pay

## 2020-05-20 DIAGNOSIS — R0600 Dyspnea, unspecified: Secondary | ICD-10-CM | POA: Insufficient documentation

## 2020-05-20 DIAGNOSIS — K219 Gastro-esophageal reflux disease without esophagitis: Secondary | ICD-10-CM | POA: Diagnosis not present

## 2020-05-20 DIAGNOSIS — R931 Abnormal findings on diagnostic imaging of heart and coronary circulation: Secondary | ICD-10-CM | POA: Insufficient documentation

## 2020-05-20 DIAGNOSIS — I2510 Atherosclerotic heart disease of native coronary artery without angina pectoris: Secondary | ICD-10-CM | POA: Insufficient documentation

## 2020-05-20 DIAGNOSIS — I10 Essential (primary) hypertension: Secondary | ICD-10-CM | POA: Diagnosis not present

## 2020-05-20 DIAGNOSIS — Z6827 Body mass index (BMI) 27.0-27.9, adult: Secondary | ICD-10-CM | POA: Diagnosis not present

## 2020-05-20 DIAGNOSIS — E875 Hyperkalemia: Secondary | ICD-10-CM | POA: Diagnosis not present

## 2020-05-20 LAB — MYOCARDIAL PERFUSION IMAGING
LV dias vol: 110 mL (ref 62–150)
LV sys vol: 41 mL
Peak HR: 85 {beats}/min
Rest HR: 53 {beats}/min
SDS: 2
SRS: 3
SSS: 5
TID: 1.16

## 2020-05-20 MED ORDER — TECHNETIUM TC 99M TETROFOSMIN IV KIT
31.9000 | PACK | Freq: Once | INTRAVENOUS | Status: AC | PRN
  Administered 2020-05-20: 31.9 via INTRAVENOUS
  Filled 2020-05-20: qty 32

## 2020-05-20 MED ORDER — TECHNETIUM TC 99M TETROFOSMIN IV KIT
9.6000 | PACK | Freq: Once | INTRAVENOUS | Status: AC | PRN
  Administered 2020-05-20: 9.6 via INTRAVENOUS
  Filled 2020-05-20: qty 10

## 2020-05-20 MED ORDER — REGADENOSON 0.4 MG/5ML IV SOLN
0.4000 mg | Freq: Once | INTRAVENOUS | Status: AC
  Administered 2020-05-20: 0.4 mg via INTRAVENOUS

## 2020-05-29 ENCOUNTER — Ambulatory Visit
Admission: RE | Admit: 2020-05-29 | Discharge: 2020-05-29 | Disposition: A | Discharge status: Home / Self Care | Payer: Medicare Other | Source: Ambulatory Visit | Attending: Cardiovascular Disease | Admitting: Cardiovascular Disease

## 2020-05-29 ENCOUNTER — Other Ambulatory Visit: Payer: Self-pay

## 2020-05-29 DIAGNOSIS — R0602 Shortness of breath: Secondary | ICD-10-CM

## 2020-05-30 ENCOUNTER — Telehealth: Payer: Self-pay

## 2020-05-30 ENCOUNTER — Other Ambulatory Visit (HOSPITAL_COMMUNITY): Payer: Medicare Other

## 2020-05-30 DIAGNOSIS — R0609 Other forms of dyspnea: Secondary | ICD-10-CM

## 2020-05-30 DIAGNOSIS — I2510 Atherosclerotic heart disease of native coronary artery without angina pectoris: Secondary | ICD-10-CM

## 2020-05-30 DIAGNOSIS — R931 Abnormal findings on diagnostic imaging of heart and coronary circulation: Secondary | ICD-10-CM

## 2020-05-30 NOTE — Telephone Encounter (Signed)
Spoke to patient high resolution chest ct results given.Dr.Berry advised to schedule echo before 8/10 appointment.Scheduler will call back with appointment.

## 2020-06-02 ENCOUNTER — Ambulatory Visit: Payer: Medicare Other | Admitting: Emergency Medicine

## 2020-07-07 ENCOUNTER — Other Ambulatory Visit: Payer: Self-pay | Admitting: *Deleted

## 2020-07-07 DIAGNOSIS — Z7982 Long term (current) use of aspirin: Secondary | ICD-10-CM | POA: Diagnosis not present

## 2020-07-07 DIAGNOSIS — E538 Deficiency of other specified B group vitamins: Secondary | ICD-10-CM | POA: Diagnosis not present

## 2020-07-07 DIAGNOSIS — Z125 Encounter for screening for malignant neoplasm of prostate: Secondary | ICD-10-CM | POA: Diagnosis not present

## 2020-07-07 DIAGNOSIS — R0609 Other forms of dyspnea: Secondary | ICD-10-CM

## 2020-07-07 DIAGNOSIS — I10 Essential (primary) hypertension: Secondary | ICD-10-CM | POA: Diagnosis not present

## 2020-07-07 DIAGNOSIS — E039 Hypothyroidism, unspecified: Secondary | ICD-10-CM | POA: Diagnosis not present

## 2020-07-07 DIAGNOSIS — E559 Vitamin D deficiency, unspecified: Secondary | ICD-10-CM | POA: Diagnosis not present

## 2020-07-08 ENCOUNTER — Ambulatory Visit (INDEPENDENT_AMBULATORY_CARE_PROVIDER_SITE_OTHER): Payer: Medicare Other | Admitting: Emergency Medicine

## 2020-07-08 ENCOUNTER — Encounter: Payer: Self-pay | Admitting: Emergency Medicine

## 2020-07-08 ENCOUNTER — Other Ambulatory Visit: Payer: Self-pay

## 2020-07-08 DIAGNOSIS — R0600 Dyspnea, unspecified: Secondary | ICD-10-CM

## 2020-07-08 DIAGNOSIS — I2510 Atherosclerotic heart disease of native coronary artery without angina pectoris: Secondary | ICD-10-CM

## 2020-07-08 DIAGNOSIS — R0609 Other forms of dyspnea: Secondary | ICD-10-CM

## 2020-07-08 LAB — PULMONARY FUNCTION TEST
DL/VA % pred: 110 %
DL/VA: 4.35 ml/min/mmHg/L
DLCO cor % pred: 119 %
DLCO cor: 26.72 ml/min/mmHg
DLCO unc % pred: 119 %
DLCO unc: 26.72 ml/min/mmHg
FEF 25-75 Post: 3.75 L/sec
FEF 25-75 Pre: 2.19 L/sec
FEF2575-%Change-Post: 71 %
FEF2575-%Pred-Post: 213 %
FEF2575-%Pred-Pre: 125 %
FEV1-%Change-Post: 9 %
FEV1-%Pred-Post: 117 %
FEV1-%Pred-Pre: 107 %
FEV1-Post: 2.98 L
FEV1-Pre: 2.72 L
FEV1FVC-%Change-Post: 10 %
FEV1FVC-%Pred-Pre: 109 %
FEV6-%Change-Post: -3 %
FEV6-%Pred-Post: 100 %
FEV6-%Pred-Pre: 104 %
FEV6-Post: 3.35 L
FEV6-Pre: 3.46 L
FEV6FVC-%Change-Post: 0 %
FEV6FVC-%Pred-Post: 107 %
FEV6FVC-%Pred-Pre: 107 %
FVC-%Change-Post: 0 %
FVC-%Pred-Post: 96 %
FVC-%Pred-Pre: 96 %
FVC-Post: 3.44 L
FVC-Pre: 3.47 L
Post FEV1/FVC ratio: 87 %
Post FEV6/FVC ratio: 100 %
Pre FEV1/FVC ratio: 78 %
Pre FEV6/FVC Ratio: 100 %
RV % pred: 117 %
RV: 2.9 L
TLC % pred: 107 %
TLC: 6.92 L

## 2020-07-08 NOTE — Progress Notes (Signed)
   Subjective:    Patient ID: Alec Snyder, male    DOB: February 11, 1941, 79 y.o.   MRN: [id number]  HPI 79 year old former smoker (3-4 pack years) with a history of transverse myelitis, melanoma, HTN, hyperlipidemia, hypothyroidism.  He has noticed SOB over the last year, able to walk on flat ground or on the treadmill. Especially noticeable when he climbs stairs. No associated CP. He was able to play golf this week, used a cart. No wheeze. He feels some light-headedness in the am when he goes from laying to sitting to standing  He has also been evaluated for cough, has had his HTN meds adjusted, ? ACE-I. The cough has improved, not completely gone.   He is planning to have a TTE today, to see Dr [name] 4/30. Says he had a repeat CXR at Dr Pennie Banter - not available to me.   Last CXR 08/17/2017 reviewed by me, shows some DJD but no pulm abnormalities.   ROV 07/08/20 --follow-up visit 79 year old gentleman with a minimal tobacco history, transverse myelitis, exertional dyspnea.  Had a reassuring cardiac stress test on 05/20/2020 without any evidence of ischemia.  A coronary CT done on 05/06/2020 reviewed by me, showed possible basilar groundglass infiltrates.  This prompted a high-resolution CT scan of the chest done on 05/29/2020 which I have reviewed, shows no evidence of interstitial lung disease, stable small 5 mm nodules (date back to 2017).  Underwent pulmonary function testing today which I have reviewed, shows overall normal spirometry without a bronchodilator response, some possible suggestion of mixed disease based on curve of his flow volume loop.  His lung volumes are normal and his diffusion capacity is normal.  MDM: Reviewed CT chest and PFT as above Reviewed office notes from Dr. [name] 05/13/2020   Review of Systems As per HPI      Objective:   Physical Exam Vitals:   07/08/20 1337  BP: 128/68  Pulse: 61  Temp: 98.3 F (36.8 C)  TempSrc: Oral  SpO2: 98%  Weight: 173 lb (78.5 kg)    Height: 5\' 7"  (1.702 m)   Gen: Pleasant, well-nourished, in no distress,  normal affect  ENT: No lesions,  mouth clear,  oropharynx clear, no postnasal drip  Neck: No JVD, no stridor   Lungs: No use of accessory muscles, no crackles or wheezing on normal respiration, no wheeze on forced expiration  Cardiovascular: RRR, heart sounds normal, no murmur or gallops, no peripheral edema  Musculoskeletal: No deformities, no cyanosis or clubbing  Neuro: alert, awake, non focal  Skin: Warm, no lesions or rash, some bruising on R>L hands        Assessment & Plan:  Exertional dyspnea After reassuring cardiac and pulmonary evaluation this looks to be due to deconditioning.  He feels better than he did on our initial visit, has lost a little weight and has been exercising more.  Pulmonary function testing, CT chest normal.  I do not think he needs any more pulmonary work-up unless he experiences a clinical change.  He does still have plans to get his echocardiogram, the last component of his cardiac work-up, and follow with Dr. Juanetta Beets, MD, PhD 07/08/2020, 2:04 PM [place] [facility] [phone] or if no answer [phone]

## 2020-07-08 NOTE — Progress Notes (Signed)
Full PFT performed today. °

## 2020-07-08 NOTE — Patient Instructions (Signed)
Your pulmonary function testing and your CT scan of the chest are both reassuring.  There is no evidence for an underlying lung problem that would be impacting your breathing. Agree with continuing to work on your exercise and conditioning. Follow with Dr. [name] to review your echocardiogram as planned Please follow-up with Dr. Lamonte Sakai, call our office if you have any progression of shortness of breath.

## 2020-07-08 NOTE — Assessment & Plan Note (Signed)
After reassuring cardiac and pulmonary evaluation this looks to be due to deconditioning.  He feels better than he did on our initial visit, has lost a little weight and has been exercising more.  Pulmonary function testing, CT chest normal.  I do not think he needs any more pulmonary work-up unless he experiences a clinical change.  He does still have plans to get his echocardiogram, the last component of his cardiac work-up, and follow with Dr. [name]

## 2020-07-09 DIAGNOSIS — N1831 Chronic kidney disease, stage 3a: Secondary | ICD-10-CM | POA: Diagnosis not present

## 2020-07-09 DIAGNOSIS — E559 Vitamin D deficiency, unspecified: Secondary | ICD-10-CM | POA: Diagnosis not present

## 2020-07-09 DIAGNOSIS — E7800 Pure hypercholesterolemia, unspecified: Secondary | ICD-10-CM | POA: Diagnosis not present

## 2020-07-09 DIAGNOSIS — E538 Deficiency of other specified B group vitamins: Secondary | ICD-10-CM | POA: Diagnosis not present

## 2020-07-09 DIAGNOSIS — Z0000 Encounter for general adult medical examination without abnormal findings: Secondary | ICD-10-CM | POA: Diagnosis not present

## 2020-07-09 DIAGNOSIS — I2510 Atherosclerotic heart disease of native coronary artery without angina pectoris: Secondary | ICD-10-CM | POA: Diagnosis not present

## 2020-07-09 DIAGNOSIS — I10 Essential (primary) hypertension: Secondary | ICD-10-CM | POA: Diagnosis not present

## 2020-07-09 DIAGNOSIS — R7303 Prediabetes: Secondary | ICD-10-CM | POA: Diagnosis not present

## 2020-07-09 DIAGNOSIS — N529 Male erectile dysfunction, unspecified: Secondary | ICD-10-CM | POA: Diagnosis not present

## 2020-07-09 DIAGNOSIS — E039 Hypothyroidism, unspecified: Secondary | ICD-10-CM | POA: Diagnosis not present

## 2020-07-10 ENCOUNTER — Ambulatory Visit (HOSPITAL_COMMUNITY): Payer: Medicare Other | Attending: Cardiovascular Disease

## 2020-07-10 ENCOUNTER — Other Ambulatory Visit: Payer: Self-pay

## 2020-07-10 DIAGNOSIS — R931 Abnormal findings on diagnostic imaging of heart and coronary circulation: Secondary | ICD-10-CM | POA: Insufficient documentation

## 2020-07-10 DIAGNOSIS — I2510 Atherosclerotic heart disease of native coronary artery without angina pectoris: Secondary | ICD-10-CM | POA: Diagnosis not present

## 2020-07-10 DIAGNOSIS — R0600 Dyspnea, unspecified: Secondary | ICD-10-CM | POA: Diagnosis not present

## 2020-07-10 DIAGNOSIS — R0609 Other forms of dyspnea: Secondary | ICD-10-CM

## 2020-07-10 LAB — ECHOCARDIOGRAM COMPLETE
Area-P 1/2: 3.72 cm2
P 1/2 time: 774 msec
S' Lateral: 2.8 cm

## 2020-07-17 DIAGNOSIS — R972 Elevated prostate specific antigen [PSA]: Secondary | ICD-10-CM | POA: Diagnosis not present

## 2020-07-22 ENCOUNTER — Ambulatory Visit (INDEPENDENT_AMBULATORY_CARE_PROVIDER_SITE_OTHER): Payer: Medicare Other | Admitting: Cardiovascular Disease

## 2020-07-22 ENCOUNTER — Other Ambulatory Visit: Payer: Self-pay

## 2020-07-22 ENCOUNTER — Encounter: Payer: Self-pay | Admitting: Cardiovascular Disease

## 2020-07-22 DIAGNOSIS — R0600 Dyspnea, unspecified: Secondary | ICD-10-CM | POA: Diagnosis not present

## 2020-07-22 DIAGNOSIS — R0609 Other forms of dyspnea: Secondary | ICD-10-CM

## 2020-07-22 DIAGNOSIS — I2510 Atherosclerotic heart disease of native coronary artery without angina pectoris: Secondary | ICD-10-CM

## 2020-07-22 DIAGNOSIS — E782 Mixed hyperlipidemia: Secondary | ICD-10-CM | POA: Diagnosis not present

## 2020-07-22 NOTE — Assessment & Plan Note (Signed)
History of hyperlipidemia on Crestor 20 mg a day with lipid profile performed 07/07/2020 revealed an LDL of 37 and HDL of 44.

## 2020-07-22 NOTE — Assessment & Plan Note (Signed)
History of dyspnea on exertion with coronary CTA performed 05/06/2020 revealing three-vessel disease but subsequent 2D echo Myoview stress test that showed normal LV function with no evidence of ischemia.  The patient is minimally symptomatic.  He denies chest pain or shortness of breath.  He did play 18 holes of golf yesterday and shot an 80 which was impressive without symptoms.  His LDL is 37.  This point, I recommended conservative care and will reserve the decision to proceed with diagnostic coronary angiography with development of symptoms.

## 2020-07-22 NOTE — Patient Instructions (Signed)

## 2020-07-22 NOTE — Progress Notes (Signed)
07/22/2020 Alec Alec   07-23-1941  [id number]  Primary Physician Alec Pretty, MD Primary Cardiologist: Alec Harp MD Alec Alec, [place]  HPI:  Alec Alec is a 79 y.o.  married Caucasian male father of 2 children, and father of 4 grandchildren referred through the courtesy of Dr. Deland Snyder for cardiovascular evaluation.  I last saw him in the office 05/13/2020.  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  His cardiac risk factor profile is only remarkable for mild medically treated hyperlipidemia. He did does not smoke nor is he diabetic. There is no family history. He has never had a heart or stroke. He denies chest pain or shortness of breath. He does exercise fairly frequently on the treadmill.  He was referred back to me by Dr. Shelia Snyder for newly recognized dyspnea on exertion.  He denies chest pain.  He did have a chest CT performed 06/25/2016 that showed dense coronary calcification and aortic calcification as well.  Dr. Shelia Snyder recently performed a 2D echo with results are unavailable to me at this time.  He had a coronary CTA performed on 05/06/2020 that did show three-vessel disease.  Subsequent Myoview stress test showed no ischemia and echo revealed normal LV function.  He essentially has no symptoms denies chest pain or shortness of breath.  In fact, he played 18 holes of golf yesterday shot and 80 without symptoms.   Current Meds  Medication Sig  . aspirin EC 81 MG tablet Take 81 mg by mouth daily. Swallow whole.  . Cholecalciferol (VITAMIN D3 PO) Take by mouth. Patient takes 1 tablet on sun and weds.  . Cyanocobalamin (VITAMIN B-12) 1000 MCG SUBL Place under the tongue daily.   . hydrochlorothiazide (MICROZIDE) 12.5 MG capsule Take 12.5 mg by mouth daily.  Marland Kitchen olmesartan (BENICAR) 40 MG tablet Take 40 mg by mouth daily.  . rosuvastatin  (CRESTOR) 20 MG tablet Take 20 mg by mouth daily.  . sildenafil (VIAGRA) 100 MG tablet Take 100 mg by mouth daily as needed for erectile dysfunction.  . traZODone (DESYREL) 50 MG tablet 1 & 1/2 TABLETS AT BEDTIME ONCE A DAY ORALLY 90 DAYS   Current Facility-Administered Medications for the 07/22/20 encounter (Office Visit) with Alec Harp, MD  Medication  . 0.9 %  sodium chloride infusion     No Known Allergies  Social History   Socioeconomic History  . Marital status: Married    Spouse name: Alec Alec  . Number of children: 2  . Years of education: 65  . Highest education level: Not on file  Occupational History    Comment: retired, Counselling psychologist transport  Tobacco Use  . Smoking status: Former Smoker    Packs/day: 1.50    Years: 3.00    Pack years: 4.50    Types: Cigarettes    Quit date: 05/14/1974    Years since quitting: 46.2  . Smokeless tobacco: Never Used  . Tobacco comment: occasional cigar.  Substance and Sexual Activity  . Alcohol use: Yes    Alcohol/week: 2.0 standard drinks    Types: 2 Standard drinks or equivalent per week    Comment: occas 2-4 beers  weekly  . Drug use: No  . Sexual activity: Not on file  Other Topics Concern  . Not on file  Social History Narrative   Lives with spouse  Caffeine use- coffee  3-4 cups daily   Social Determinants of Health   Financial Resource Strain:   . Difficulty of Paying Living Expenses:   Food Insecurity:   . Worried About Charity fundraiser in the Last Year:   . Arboriculturist in the Last Year:   Transportation Needs:   . Film/video editor (Medical):   Marland Kitchen Lack of Transportation (Non-Medical):   Physical Activity:   . Days of Exercise per Week:   . Minutes of Exercise per Session:   Stress:   . Feeling of Stress :   Social Connections:   . Frequency of Communication with Friends and Family:   . Frequency of Social Gatherings with Friends and Family:   . Attends Religious Services:   . Active Member of  Clubs or Organizations:   . Attends Archivist Meetings:   Marland Kitchen Marital Status:   Intimate Partner Violence:   . Fear of Current or Ex-Partner:   . Emotionally Abused:   Marland Kitchen Physically Abused:   . Sexually Abused:      Review of Systems: General: negative for chills, fever, night sweats or weight changes.  Cardiovascular: negative for chest pain, dyspnea on exertion, edema, orthopnea, palpitations, paroxysmal nocturnal dyspnea or shortness of breath Dermatological: negative for rash Respiratory: negative for cough or wheezing Urologic: negative for hematuria Abdominal: negative for nausea, vomiting, diarrhea, bright red blood per rectum, melena, or hematemesis Neurologic: negative for visual changes, syncope, or dizziness All other systems reviewed and are otherwise negative except as noted above.    Blood pressure 116/64, pulse 81, height 5\' 8"  (1.727 m), weight 173 lb (78.5 kg), SpO2 96 %.  General appearance: alert and no distress Neck: no adenopathy, no carotid bruit, no JVD, supple, symmetrical, trachea midline and thyroid not enlarged, symmetric, no tenderness/mass/nodules Lungs: clear to auscultation bilaterally Heart: regular rate and rhythm, S1, S2 normal, no murmur, click, rub or gallop Extremities: extremities normal, atraumatic, no cyanosis or edema Pulses: 2+ and symmetric Skin: Skin color, texture, turgor normal. No rashes or lesions Neurologic: Alert and oriented X 3, normal strength and tone. Normal symmetric reflexes. Normal coordination and gait  EKG not performed today  ASSESSMENT AND PLAN:   Hyperlipidemia History of hyperlipidemia on Crestor 20 mg a day with lipid profile performed 07/07/2020 revealed an LDL of 37 and HDL of 44.  Exertional dyspnea History of dyspnea on exertion with coronary CTA performed 05/06/2020 revealing three-vessel disease but subsequent 2D echo Myoview stress test that showed normal LV function with no evidence of ischemia.   The patient is minimally symptomatic.  He denies chest pain or shortness of breath.  He did play 18 holes of golf yesterday and shot an 80 which was impressive without symptoms.  His LDL is 37.  This point, I recommended conservative care and will reserve the decision to proceed with diagnostic coronary angiography with development of symptoms.      Alec Harp MD FACP,FACC,FAHA, [facility] 07/22/2020 8:31 AM

## 2020-08-19 DIAGNOSIS — I10 Essential (primary) hypertension: Secondary | ICD-10-CM | POA: Diagnosis not present

## 2020-08-19 DIAGNOSIS — E7800 Pure hypercholesterolemia, unspecified: Secondary | ICD-10-CM | POA: Diagnosis not present

## 2020-09-04 DIAGNOSIS — H53002 Unspecified amblyopia, left eye: Secondary | ICD-10-CM | POA: Diagnosis not present

## 2020-09-04 DIAGNOSIS — H25813 Combined forms of age-related cataract, bilateral: Secondary | ICD-10-CM | POA: Diagnosis not present

## 2020-09-04 DIAGNOSIS — H5201 Hypermetropia, right eye: Secondary | ICD-10-CM | POA: Diagnosis not present

## 2020-09-04 DIAGNOSIS — H18513 Endothelial corneal dystrophy, bilateral: Secondary | ICD-10-CM | POA: Diagnosis not present

## 2020-09-09 DIAGNOSIS — Z23 Encounter for immunization: Secondary | ICD-10-CM | POA: Diagnosis not present

## 2020-09-30 DIAGNOSIS — R972 Elevated prostate specific antigen [PSA]: Secondary | ICD-10-CM | POA: Diagnosis not present

## 2020-09-30 DIAGNOSIS — Z23 Encounter for immunization: Secondary | ICD-10-CM | POA: Diagnosis not present

## 2020-10-06 DIAGNOSIS — M549 Dorsalgia, unspecified: Secondary | ICD-10-CM | POA: Diagnosis not present

## 2020-10-06 DIAGNOSIS — D8989 Other specified disorders involving the immune mechanism, not elsewhere classified: Secondary | ICD-10-CM | POA: Diagnosis not present

## 2020-10-06 DIAGNOSIS — M19011 Primary osteoarthritis, right shoulder: Secondary | ICD-10-CM | POA: Diagnosis not present

## 2020-10-06 DIAGNOSIS — M1990 Unspecified osteoarthritis, unspecified site: Secondary | ICD-10-CM | POA: Diagnosis not present

## 2020-10-06 DIAGNOSIS — M25519 Pain in unspecified shoulder: Secondary | ICD-10-CM | POA: Diagnosis not present

## 2020-10-06 DIAGNOSIS — M19042 Primary osteoarthritis, left hand: Secondary | ICD-10-CM | POA: Diagnosis not present

## 2020-10-06 DIAGNOSIS — M79641 Pain in right hand: Secondary | ICD-10-CM | POA: Diagnosis not present

## 2020-10-06 DIAGNOSIS — M79642 Pain in left hand: Secondary | ICD-10-CM | POA: Diagnosis not present

## 2020-10-06 DIAGNOSIS — M25511 Pain in right shoulder: Secondary | ICD-10-CM | POA: Diagnosis not present

## 2020-10-06 DIAGNOSIS — R768 Other specified abnormal immunological findings in serum: Secondary | ICD-10-CM | POA: Diagnosis not present

## 2020-10-06 DIAGNOSIS — M25512 Pain in left shoulder: Secondary | ICD-10-CM | POA: Diagnosis not present

## 2020-10-06 DIAGNOSIS — G373 Acute transverse myelitis in demyelinating disease of central nervous system: Secondary | ICD-10-CM | POA: Diagnosis not present

## 2020-10-06 DIAGNOSIS — M7989 Other specified soft tissue disorders: Secondary | ICD-10-CM | POA: Diagnosis not present

## 2020-10-06 DIAGNOSIS — M19041 Primary osteoarthritis, right hand: Secondary | ICD-10-CM | POA: Diagnosis not present

## 2020-11-14 ENCOUNTER — Ambulatory Visit: Payer: Medicare Other | Admitting: Cardiovascular Disease

## 2020-11-20 DIAGNOSIS — D2271 Melanocytic nevi of right lower limb, including hip: Secondary | ICD-10-CM | POA: Diagnosis not present

## 2020-11-20 DIAGNOSIS — D1801 Hemangioma of skin and subcutaneous tissue: Secondary | ICD-10-CM | POA: Diagnosis not present

## 2020-11-20 DIAGNOSIS — L700 Acne vulgaris: Secondary | ICD-10-CM | POA: Diagnosis not present

## 2020-11-20 DIAGNOSIS — B351 Tinea unguium: Secondary | ICD-10-CM | POA: Diagnosis not present

## 2020-11-20 DIAGNOSIS — Z85828 Personal history of other malignant neoplasm of skin: Secondary | ICD-10-CM | POA: Diagnosis not present

## 2020-11-20 DIAGNOSIS — L570 Actinic keratosis: Secondary | ICD-10-CM | POA: Diagnosis not present

## 2020-11-20 DIAGNOSIS — L821 Other seborrheic keratosis: Secondary | ICD-10-CM | POA: Diagnosis not present

## 2020-11-20 DIAGNOSIS — L814 Other melanin hyperpigmentation: Secondary | ICD-10-CM | POA: Diagnosis not present

## 2021-01-28 NOTE — Progress Notes (Signed)
Cardiology Clinic Note   Patient Name: Alec Snyder Date of Encounter: 01/29/2021  Primary Care Provider:  Deland Pretty, MD Primary Cardiologist:  Quay Burow, MD  Patient Profile    Alec Snyder 80 year old male presents the clinic today for follow-up evaluation of his hyperlipidemia and exertional dyspnea.  Past Medical History    Past Medical History:  Diagnosis Date  . Arthritis    in hands  . Cancer [facility])    melanoma/ on hand and leg  . Hemorrhoids    occasional  . Hx of adenomatous colonic polyps 06/30/2017  . Hyperlipemia   . Hypothyroid   . Inguinal hernia    bilateral  . Joint pain    minor  . Transverse myelitis ([place])   . Wears glasses    Past Surgical History:  Procedure Laterality Date  . BLEPHAROPLASTY Bilateral 2016  . COLONOSCOPY    . HERNIA REPAIR  2012   BIH  . MELANOMA EXCISION      Allergies  No Known Allergies  History of Present Illness  Mr. Alec Snyder was initially referred by his PCP [name] for cardiac evaluation.  He was seen in the office 05/13/2020.  He was [name] in [place].  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  His cardiac risks included mild medically treated hyperlipidemia.  He had no family history of heart disease.  He denied chest pain and shortness of breath.  He reported that he would frequently exercise on his treadmill.  He was again referred back to Dr. [name] by [name] for newly recognized dyspnea with exertion.  He denied chest pain.  He had a chest CT performed 06/25/2016 that showed dense coronary calcification and aortic calcification.  [name] performed a echocardiogram however, results are not available.  Is a seen by Dr. [name] on 07/22/2020.  During that time his coronary CTA was reviewed that showed three-vessel disease.  A nuclear stress test showed no ischemia and an echocardiogram showed normal LV  function.  He continued to deny chest pain shortness of breath.  He reported that he played 18 holes of golf and shot an 80 without symptoms the day before his appointment.  He presents the clinic today for follow-up evaluation states he feels well.  He has not been golfing as much due to the cooler weather.  He also notes that he has had a cough for the past month.  He reports that it is a nagging dry cough and nonproductive.  He denies body aches related to illness and fevers.  When asked about his diet he reports that he has some dietary indiscretion due to his [place] living at the beach house most of the time.  This forces him to prepare most of his own foods.  I will give him the salty 6 diet sheet, have him increase his physical activity as tolerated, and follow-up in 6 months.  I have instructed him to contact the office if his cough does not improve.  Today he denies chest pain, shortness of breath, lower extremity edema, fatigue, palpitations, melena, hematuria, hemoptysis, diaphoresis, weakness, presyncope, syncope, orthopnea, and PND.   Home Medications    Prior to Admission medications   Medication Sig Start Date End Date Taking? Authorizing Provider  aspirin EC 81 MG tablet Take 81 mg by mouth daily. Swallow whole.    [provider]  Cholecalciferol (VITAMIN D3  PO) Take by mouth. Patient takes 1 tablet on sun and weds.    [provider]  Cyanocobalamin (VITAMIN B-12) 1000 MCG SUBL Place under the tongue daily.     [provider]  hydrochlorothiazide (MICROZIDE) 12.5 MG capsule Take 12.5 mg by mouth daily.    [provider]  olmesartan (BENICAR) 40 MG tablet Take 40 mg by mouth daily.    [provider]  rosuvastatin (CRESTOR) 20 MG tablet Take 20 mg by mouth daily.    [provider]  sildenafil (VIAGRA) 100 MG tablet Take 100 mg by mouth daily as needed for erectile dysfunction.    [provider]  traZODone (DESYREL) 50  MG tablet 1 & 1/2 TABLETS AT BEDTIME ONCE A DAY ORALLY 90 DAYS 05/15/17   [provider]    Family History    Family History  Problem Relation Age of Onset  . Kidney failure Father   . Diabetes Mother   . Arthritis Brother   . Arthritis Sister    He indicated that his mother is deceased. He indicated that his father is deceased. He indicated that his sister is alive. He indicated that his brother is alive.  Social History    Social History   Socioeconomic History  . Marital status: Married    Spouse name: Alec Snyder  . Number of children: 2  . Years of education: 12  . Highest education level: Not on file  Occupational History    Comment: retired, Counselling psychologist transport  Tobacco Use  . Smoking status: Former Smoker    Packs/day: 1.50    Years: 3.00    Pack years: 4.50    Types: Cigarettes    Quit date: 05/14/1974    Years since quitting: 46.7  . Smokeless tobacco: Never Used  . Tobacco comment: occasional cigar.  Substance and Sexual Activity  . Alcohol use: Yes    Alcohol/week: 2.0 standard drinks    Types: 2 Standard drinks or equivalent per week    Comment: occas 2-4 beers  weekly  . Drug use: No  . Sexual activity: Not on file  Other Topics Concern  . Not on file  Social History Narrative   Lives with spouse   Caffeine use- coffee  3-4 cups daily   Social Determinants of Health   Financial Resource Strain: Not on file  Food Insecurity: Not on file  Transportation Needs: Not on file  Physical Activity: Not on file  Stress: Not on file  Social Connections: Not on file  Intimate Partner Violence: Not on file     Review of Systems    General:  No chills, fever, night sweats or weight changes.  Cardiovascular:  No chest pain, dyspnea on exertion, edema, orthopnea, palpitations, paroxysmal nocturnal dyspnea. Dermatological: No rash, lesions/masses Respiratory: No cough, dyspnea Urologic: No hematuria, dysuria Abdominal:   No nausea, vomiting, diarrhea,  bright red blood per rectum, melena, or hematemesis Neurologic:  No visual changes, wkns, changes in mental status. All other systems reviewed and are otherwise negative except as noted above.  Physical Exam    VS:  BP 136/72   Pulse 65   Ht 5\' 8"  (1.727 m)   Wt 178 lb 12.8 oz (81.1 kg)   SpO2 98%   BMI 27.19 kg/m  , BMI Body mass index is 27.19 kg/m. GEN: Well nourished, well developed, in no acute distress. HEENT: normal. Neck: Supple, no JVD, carotid bruits, or masses. Cardiac: RRR, no murmurs, rubs, or gallops. No  clubbing, cyanosis, edema.  Radials/DP/PT 2+ and equal bilaterally.  Respiratory:  Respirations regular and unlabored, clear to auscultation bilaterally. GI: Soft, nontender, nondistended, BS + x 4. MS: no deformity or atrophy. Skin: warm and dry, no rash. Neuro:  Strength and sensation are intact. Psych: Normal affect.  Accessory Clinical Findings    Recent Labs: No results found for requested labs within last 8760 hours.   Recent Lipid Panel No results found for: CHOL, TRIG, HDL, CHOLHDL, VLDL, LDLCALC, LDLDIRECT  ECG personally reviewed by me today-normal sinus rhythm 65 bpm no ST or T wave deviation- No acute changes   Coronary CTA IMPRESSION: 1. Coronary calcium score of 1345. This was 11 percentile for age and sex matched control.  2. Severe Multi-vessel coronary artery disease. CADRADS 4A. Recommend left heart catheterization.  3. Normal coronary origin with right dominance.  4. Aortic Atherosclerosis  Kardie Tobb, DO   Echocardiogram 07/10/2020 IMPRESSIONS    1. Left ventricular ejection fraction, by estimation, is 60 to 65%. The  left ventricle has normal function. The left ventricle has no regional  wall motion abnormalities. Left ventricular diastolic parameters were  normal.  2. Right ventricular systolic function is normal. The right ventricular  size is normal. There is normal pulmonary artery systolic pressure.  3. Left  atrial size was mildly dilated.  4. The mitral valve is normal in structure. Trivial mitral valve  regurgitation. No evidence of mitral stenosis.  5. The aortic valve is normal in structure. Aortic valve regurgitation is  trivial. No aortic stenosis is present.  6. The inferior vena cava is normal in size with greater than 50%  respiratory variability, suggesting right atrial pressure of 3 mmHg.   Comparison(s): Report only 04/10/20 EF 55-60%.  Nuclear stress test 05/20/2020   The left ventricular ejection fraction is normal (55-65%).  Nuclear stress EF: 62%.  There was no ST segment deviation noted during stress.  The study is normal.   Normal pharmacologic nuclear study with no evidence for prior infarct or ischemia. Normal LVEF.   Assessment & Plan   1.  Hyperlipidemia-LDL 37 on 07/07/2020 Continue rosuvastatin, aspirin Heart healthy low-sodium high-fiber diet Increase physical activity as tolerated Repeat fasting lipids and liver prior to 50-month follow-up  Dyspnea on exertion-no increased DOE today or activity intolerance.  Echocardiogram 06/20/2020 showed normal LV function and no valvular abnormalities.  He continues to stay very physically active using his treadmill and golfing when the weather allows. Maintain physical activity Heart healthy low-sodium diet Continue to monitor  Coronary artery disease-no chest pain today.  Underwent coronary CTA 05/06/2020 which showed three-vessel CAD.  He underwent follow-up echocardiogram and nuclear stress testing.  Echocardiogram was normal and stress test showed no ischemia with normal LV function.  Conservative care with medical management recommended. Continue aspirin, rosuvastatin, hydrochlorothiazide, olmesartan Heart healthy low-sodium diet-salty 6 given Increase physical activity as tolerated  Disposition: Follow-up with Dr. [name] in 6 months.  Jossie Ng. Sahaj Bona NP-C    01/29/2021, 4:08 PM [facility]  [facility] [address] Office [phone] Fax [phone]  Notice: This dictation was prepared with Dragon dictation along with smaller phrase technology. Any transcriptional errors that result from this process are unintentional and may not be corrected upon review.  I spent 15 minutes examining this patient, reviewing medications, and using patient centered shared decision making involving her cardiac care.  Prior to her visit I spent greater than 20 minutes reviewing her past medical history,  medications,  and prior cardiac tests.

## 2021-01-29 ENCOUNTER — Encounter: Payer: Self-pay | Admitting: General Practice

## 2021-01-29 ENCOUNTER — Other Ambulatory Visit: Payer: Self-pay

## 2021-01-29 ENCOUNTER — Ambulatory Visit (INDEPENDENT_AMBULATORY_CARE_PROVIDER_SITE_OTHER): Payer: Medicare Other | Admitting: General Practice

## 2021-01-29 VITALS — BP 136/72 | HR 65 | Ht 68.0 in | Wt 178.8 lb

## 2021-01-29 DIAGNOSIS — E782 Mixed hyperlipidemia: Secondary | ICD-10-CM | POA: Diagnosis not present

## 2021-01-29 DIAGNOSIS — R0600 Dyspnea, unspecified: Secondary | ICD-10-CM | POA: Diagnosis not present

## 2021-01-29 DIAGNOSIS — I2510 Atherosclerotic heart disease of native coronary artery without angina pectoris: Secondary | ICD-10-CM | POA: Diagnosis not present

## 2021-01-29 NOTE — Patient Instructions (Signed)
Medication Instructions:  The current medical regimen is effective;  continue present plan and medications as directed. Please refer to the Current Medication list given to you today.  *If you need a refill on your cardiac medications before your next appointment, please call your pharmacy*  Lab Work:   Testing/Procedures:  NONE    NONE  Special Instructions PLEASE READ AND FOLLOW SALTY 6-ATTACHED-1,800mg  daily  PLEASE INCREASE PHYSICAL ACTIVITY AS TOLERATED  Follow-Up: Your next appointment:  6 month(s) In Person with Quay Burow, MD OR IF UNAVAILABLE JESSE CLEAVER, FNP-C   Please call our office 2 months in advance to schedule this appointment   At [facility], you and your health needs are our priority.  As part of our continuing mission to provide you with exceptional heart care, we have created designated Provider Care Teams.  These Care Teams include your primary Cardiologist (physician) and Advanced Practice Providers (APPs -  Physician Assistants and Nurse Practitioners) who all work together to provide you with the care you need, when you need it.            6 SALTY THINGS TO AVOID     1,800MG  DAILY

## 2021-02-03 DIAGNOSIS — M85851 Other specified disorders of bone density and structure, right thigh: Secondary | ICD-10-CM | POA: Diagnosis not present

## 2021-02-03 DIAGNOSIS — M8589 Other specified disorders of bone density and structure, multiple sites: Secondary | ICD-10-CM | POA: Diagnosis not present

## 2021-02-03 DIAGNOSIS — B351 Tinea unguium: Secondary | ICD-10-CM | POA: Diagnosis not present

## 2021-02-03 DIAGNOSIS — M85852 Other specified disorders of bone density and structure, left thigh: Secondary | ICD-10-CM | POA: Diagnosis not present

## 2021-02-03 DIAGNOSIS — Z79899 Other long term (current) drug therapy: Secondary | ICD-10-CM | POA: Diagnosis not present

## 2021-03-24 DIAGNOSIS — B351 Tinea unguium: Secondary | ICD-10-CM | POA: Diagnosis not present

## 2021-03-24 DIAGNOSIS — B353 Tinea pedis: Secondary | ICD-10-CM | POA: Diagnosis not present

## 2021-04-30 DIAGNOSIS — M7989 Other specified soft tissue disorders: Secondary | ICD-10-CM | POA: Diagnosis not present

## 2021-04-30 DIAGNOSIS — M25519 Pain in unspecified shoulder: Secondary | ICD-10-CM | POA: Diagnosis not present

## 2021-04-30 DIAGNOSIS — M1990 Unspecified osteoarthritis, unspecified site: Secondary | ICD-10-CM | POA: Diagnosis not present

## 2021-04-30 DIAGNOSIS — G373 Acute transverse myelitis in demyelinating disease of central nervous system: Secondary | ICD-10-CM | POA: Diagnosis not present

## 2021-04-30 DIAGNOSIS — M79641 Pain in right hand: Secondary | ICD-10-CM | POA: Diagnosis not present

## 2021-04-30 DIAGNOSIS — R768 Other specified abnormal immunological findings in serum: Secondary | ICD-10-CM | POA: Diagnosis not present

## 2021-05-20 DIAGNOSIS — R2689 Other abnormalities of gait and mobility: Secondary | ICD-10-CM | POA: Diagnosis not present

## 2021-05-20 DIAGNOSIS — G959 Disease of spinal cord, unspecified: Secondary | ICD-10-CM | POA: Diagnosis not present

## 2021-05-20 DIAGNOSIS — Z6827 Body mass index (BMI) 27.0-27.9, adult: Secondary | ICD-10-CM | POA: Diagnosis not present

## 2021-05-20 DIAGNOSIS — G373 Acute transverse myelitis in demyelinating disease of central nervous system: Secondary | ICD-10-CM | POA: Diagnosis not present

## 2021-05-20 DIAGNOSIS — G629 Polyneuropathy, unspecified: Secondary | ICD-10-CM | POA: Diagnosis not present

## 2021-05-25 DIAGNOSIS — Z7983 Long term (current) use of bisphosphonates: Secondary | ICD-10-CM | POA: Diagnosis not present

## 2021-05-25 DIAGNOSIS — G629 Polyneuropathy, unspecified: Secondary | ICD-10-CM | POA: Diagnosis not present

## 2021-06-11 DIAGNOSIS — M1990 Unspecified osteoarthritis, unspecified site: Secondary | ICD-10-CM | POA: Diagnosis not present

## 2021-06-11 DIAGNOSIS — M7989 Other specified soft tissue disorders: Secondary | ICD-10-CM | POA: Diagnosis not present

## 2021-06-11 DIAGNOSIS — M79643 Pain in unspecified hand: Secondary | ICD-10-CM | POA: Diagnosis not present

## 2021-06-11 DIAGNOSIS — M25519 Pain in unspecified shoulder: Secondary | ICD-10-CM | POA: Diagnosis not present

## 2021-06-11 DIAGNOSIS — R768 Other specified abnormal immunological findings in serum: Secondary | ICD-10-CM | POA: Diagnosis not present

## 2021-06-22 DIAGNOSIS — G373 Acute transverse myelitis in demyelinating disease of central nervous system: Secondary | ICD-10-CM | POA: Diagnosis not present

## 2021-06-22 DIAGNOSIS — R2689 Other abnormalities of gait and mobility: Secondary | ICD-10-CM | POA: Diagnosis not present

## 2021-06-22 DIAGNOSIS — I10 Essential (primary) hypertension: Secondary | ICD-10-CM | POA: Diagnosis not present

## 2021-07-12 DIAGNOSIS — E7800 Pure hypercholesterolemia, unspecified: Secondary | ICD-10-CM | POA: Diagnosis not present

## 2021-07-12 DIAGNOSIS — N1831 Chronic kidney disease, stage 3a: Secondary | ICD-10-CM | POA: Diagnosis not present

## 2021-07-12 DIAGNOSIS — I129 Hypertensive chronic kidney disease with stage 1 through stage 4 chronic kidney disease, or unspecified chronic kidney disease: Secondary | ICD-10-CM | POA: Diagnosis not present

## 2021-07-13 DIAGNOSIS — G373 Acute transverse myelitis in demyelinating disease of central nervous system: Secondary | ICD-10-CM | POA: Diagnosis not present

## 2021-07-13 DIAGNOSIS — I10 Essential (primary) hypertension: Secondary | ICD-10-CM | POA: Diagnosis not present

## 2021-07-13 DIAGNOSIS — M2578 Osteophyte, vertebrae: Secondary | ICD-10-CM | POA: Diagnosis not present

## 2021-07-13 DIAGNOSIS — Z5329 Procedure and treatment not carried out because of patient's decision for other reasons: Secondary | ICD-10-CM | POA: Diagnosis not present

## 2021-07-13 DIAGNOSIS — M47812 Spondylosis without myelopathy or radiculopathy, cervical region: Secondary | ICD-10-CM | POA: Diagnosis not present

## 2021-07-13 DIAGNOSIS — E039 Hypothyroidism, unspecified: Secondary | ICD-10-CM | POA: Diagnosis not present

## 2021-07-13 DIAGNOSIS — G0489 Other myelitis: Secondary | ICD-10-CM | POA: Diagnosis not present

## 2021-07-13 DIAGNOSIS — Z7982 Long term (current) use of aspirin: Secondary | ICD-10-CM | POA: Diagnosis not present

## 2021-07-13 DIAGNOSIS — E7800 Pure hypercholesterolemia, unspecified: Secondary | ICD-10-CM | POA: Diagnosis not present

## 2021-07-16 DIAGNOSIS — R7303 Prediabetes: Secondary | ICD-10-CM | POA: Diagnosis not present

## 2021-07-16 DIAGNOSIS — L723 Sebaceous cyst: Secondary | ICD-10-CM | POA: Diagnosis not present

## 2021-07-16 DIAGNOSIS — N1831 Chronic kidney disease, stage 3a: Secondary | ICD-10-CM | POA: Diagnosis not present

## 2021-07-16 DIAGNOSIS — Z1212 Encounter for screening for malignant neoplasm of rectum: Secondary | ICD-10-CM | POA: Diagnosis not present

## 2021-07-16 DIAGNOSIS — H538 Other visual disturbances: Secondary | ICD-10-CM | POA: Diagnosis not present

## 2021-07-16 DIAGNOSIS — G373 Acute transverse myelitis in demyelinating disease of central nervous system: Secondary | ICD-10-CM | POA: Diagnosis not present

## 2021-07-16 DIAGNOSIS — E538 Deficiency of other specified B group vitamins: Secondary | ICD-10-CM | POA: Diagnosis not present

## 2021-07-16 DIAGNOSIS — R053 Chronic cough: Secondary | ICD-10-CM | POA: Diagnosis not present

## 2021-07-16 DIAGNOSIS — E039 Hypothyroidism, unspecified: Secondary | ICD-10-CM | POA: Diagnosis not present

## 2021-07-16 DIAGNOSIS — I700 Atherosclerosis of aorta: Secondary | ICD-10-CM | POA: Diagnosis not present

## 2021-07-16 DIAGNOSIS — I2510 Atherosclerotic heart disease of native coronary artery without angina pectoris: Secondary | ICD-10-CM | POA: Diagnosis not present

## 2021-07-16 DIAGNOSIS — Z125 Encounter for screening for malignant neoplasm of prostate: Secondary | ICD-10-CM | POA: Diagnosis not present

## 2021-07-16 DIAGNOSIS — M159 Polyosteoarthritis, unspecified: Secondary | ICD-10-CM | POA: Diagnosis not present

## 2021-07-16 DIAGNOSIS — I10 Essential (primary) hypertension: Secondary | ICD-10-CM | POA: Diagnosis not present

## 2021-07-20 DIAGNOSIS — R053 Chronic cough: Secondary | ICD-10-CM | POA: Diagnosis not present

## 2021-08-07 DIAGNOSIS — H524 Presbyopia: Secondary | ICD-10-CM | POA: Diagnosis not present

## 2021-08-07 DIAGNOSIS — H2513 Age-related nuclear cataract, bilateral: Secondary | ICD-10-CM | POA: Diagnosis not present

## 2021-08-12 DIAGNOSIS — E7800 Pure hypercholesterolemia, unspecified: Secondary | ICD-10-CM | POA: Diagnosis not present

## 2021-08-12 DIAGNOSIS — N1831 Chronic kidney disease, stage 3a: Secondary | ICD-10-CM | POA: Diagnosis not present

## 2021-08-12 DIAGNOSIS — I129 Hypertensive chronic kidney disease with stage 1 through stage 4 chronic kidney disease, or unspecified chronic kidney disease: Secondary | ICD-10-CM | POA: Diagnosis not present

## 2021-08-12 DIAGNOSIS — E039 Hypothyroidism, unspecified: Secondary | ICD-10-CM | POA: Diagnosis not present

## 2021-08-18 DIAGNOSIS — I10 Essential (primary) hypertension: Secondary | ICD-10-CM | POA: Diagnosis not present

## 2021-08-18 DIAGNOSIS — R7303 Prediabetes: Secondary | ICD-10-CM | POA: Diagnosis not present

## 2021-08-18 DIAGNOSIS — I2510 Atherosclerotic heart disease of native coronary artery without angina pectoris: Secondary | ICD-10-CM | POA: Diagnosis not present

## 2021-08-18 DIAGNOSIS — E039 Hypothyroidism, unspecified: Secondary | ICD-10-CM | POA: Diagnosis not present

## 2021-08-20 DIAGNOSIS — M0609 Rheumatoid arthritis without rheumatoid factor, multiple sites: Secondary | ICD-10-CM | POA: Diagnosis not present

## 2021-08-20 DIAGNOSIS — M25519 Pain in unspecified shoulder: Secondary | ICD-10-CM | POA: Diagnosis not present

## 2021-08-20 DIAGNOSIS — M1990 Unspecified osteoarthritis, unspecified site: Secondary | ICD-10-CM | POA: Diagnosis not present

## 2021-08-20 DIAGNOSIS — R944 Abnormal results of kidney function studies: Secondary | ICD-10-CM | POA: Diagnosis not present

## 2021-08-20 DIAGNOSIS — M7989 Other specified soft tissue disorders: Secondary | ICD-10-CM | POA: Diagnosis not present

## 2021-08-20 DIAGNOSIS — M79643 Pain in unspecified hand: Secondary | ICD-10-CM | POA: Diagnosis not present

## 2021-08-20 DIAGNOSIS — Z79899 Other long term (current) drug therapy: Secondary | ICD-10-CM | POA: Diagnosis not present

## 2021-08-20 DIAGNOSIS — R768 Other specified abnormal immunological findings in serum: Secondary | ICD-10-CM | POA: Diagnosis not present

## 2021-09-02 DIAGNOSIS — N5201 Erectile dysfunction due to arterial insufficiency: Secondary | ICD-10-CM | POA: Diagnosis not present

## 2021-09-02 DIAGNOSIS — R972 Elevated prostate specific antigen [PSA]: Secondary | ICD-10-CM | POA: Diagnosis not present

## 2021-09-11 DIAGNOSIS — N1831 Chronic kidney disease, stage 3a: Secondary | ICD-10-CM | POA: Diagnosis not present

## 2021-09-11 DIAGNOSIS — E7800 Pure hypercholesterolemia, unspecified: Secondary | ICD-10-CM | POA: Diagnosis not present

## 2021-09-11 DIAGNOSIS — E039 Hypothyroidism, unspecified: Secondary | ICD-10-CM | POA: Diagnosis not present

## 2021-09-11 DIAGNOSIS — I129 Hypertensive chronic kidney disease with stage 1 through stage 4 chronic kidney disease, or unspecified chronic kidney disease: Secondary | ICD-10-CM | POA: Diagnosis not present

## 2021-09-17 DIAGNOSIS — G373 Acute transverse myelitis in demyelinating disease of central nervous system: Secondary | ICD-10-CM | POA: Diagnosis not present

## 2021-09-17 DIAGNOSIS — Z79899 Other long term (current) drug therapy: Secondary | ICD-10-CM | POA: Diagnosis not present

## 2021-10-08 DIAGNOSIS — Z23 Encounter for immunization: Secondary | ICD-10-CM | POA: Diagnosis not present

## 2021-10-15 DIAGNOSIS — M1990 Unspecified osteoarthritis, unspecified site: Secondary | ICD-10-CM | POA: Diagnosis not present

## 2021-10-15 DIAGNOSIS — M0609 Rheumatoid arthritis without rheumatoid factor, multiple sites: Secondary | ICD-10-CM | POA: Diagnosis not present

## 2021-10-15 DIAGNOSIS — Z79899 Other long term (current) drug therapy: Secondary | ICD-10-CM | POA: Diagnosis not present

## 2021-10-15 DIAGNOSIS — R972 Elevated prostate specific antigen [PSA]: Secondary | ICD-10-CM | POA: Diagnosis not present

## 2021-10-15 DIAGNOSIS — M7989 Other specified soft tissue disorders: Secondary | ICD-10-CM | POA: Diagnosis not present

## 2021-10-15 DIAGNOSIS — E871 Hypo-osmolality and hyponatremia: Secondary | ICD-10-CM | POA: Diagnosis not present

## 2021-10-15 DIAGNOSIS — Z125 Encounter for screening for malignant neoplasm of prostate: Secondary | ICD-10-CM | POA: Diagnosis not present

## 2021-10-15 DIAGNOSIS — R768 Other specified abnormal immunological findings in serum: Secondary | ICD-10-CM | POA: Diagnosis not present

## 2021-10-15 DIAGNOSIS — M79643 Pain in unspecified hand: Secondary | ICD-10-CM | POA: Diagnosis not present

## 2021-10-15 DIAGNOSIS — R944 Abnormal results of kidney function studies: Secondary | ICD-10-CM | POA: Diagnosis not present

## 2021-11-13 DIAGNOSIS — H2513 Age-related nuclear cataract, bilateral: Secondary | ICD-10-CM | POA: Diagnosis not present

## 2021-11-17 DIAGNOSIS — I10 Essential (primary) hypertension: Secondary | ICD-10-CM | POA: Diagnosis not present

## 2021-11-17 DIAGNOSIS — I2510 Atherosclerotic heart disease of native coronary artery without angina pectoris: Secondary | ICD-10-CM | POA: Diagnosis not present

## 2021-11-17 DIAGNOSIS — R7303 Prediabetes: Secondary | ICD-10-CM | POA: Diagnosis not present

## 2021-11-17 DIAGNOSIS — E782 Mixed hyperlipidemia: Secondary | ICD-10-CM | POA: Diagnosis not present

## 2021-11-17 DIAGNOSIS — G373 Acute transverse myelitis in demyelinating disease of central nervous system: Secondary | ICD-10-CM | POA: Diagnosis not present

## 2021-11-17 DIAGNOSIS — E039 Hypothyroidism, unspecified: Secondary | ICD-10-CM | POA: Diagnosis not present

## 2021-11-18 DIAGNOSIS — M4710 Other spondylosis with myelopathy, site unspecified: Secondary | ICD-10-CM | POA: Diagnosis not present

## 2021-11-18 DIAGNOSIS — E785 Hyperlipidemia, unspecified: Secondary | ICD-10-CM | POA: Diagnosis not present

## 2021-11-18 DIAGNOSIS — M1990 Unspecified osteoarthritis, unspecified site: Secondary | ICD-10-CM | POA: Diagnosis not present

## 2021-11-18 DIAGNOSIS — M19042 Primary osteoarthritis, left hand: Secondary | ICD-10-CM | POA: Diagnosis not present

## 2021-11-18 DIAGNOSIS — N183 Chronic kidney disease, stage 3 unspecified: Secondary | ICD-10-CM | POA: Diagnosis not present

## 2021-11-18 DIAGNOSIS — I700 Atherosclerosis of aorta: Secondary | ICD-10-CM | POA: Diagnosis not present

## 2021-11-18 DIAGNOSIS — Z7982 Long term (current) use of aspirin: Secondary | ICD-10-CM | POA: Diagnosis not present

## 2021-11-18 DIAGNOSIS — M19041 Primary osteoarthritis, right hand: Secondary | ICD-10-CM | POA: Diagnosis not present

## 2021-11-18 DIAGNOSIS — D649 Anemia, unspecified: Secondary | ICD-10-CM | POA: Diagnosis not present

## 2021-11-18 DIAGNOSIS — I129 Hypertensive chronic kidney disease with stage 1 through stage 4 chronic kidney disease, or unspecified chronic kidney disease: Secondary | ICD-10-CM | POA: Diagnosis not present

## 2021-11-18 DIAGNOSIS — I3481 Nonrheumatic mitral (valve) annulus calcification: Secondary | ICD-10-CM | POA: Diagnosis not present

## 2021-11-18 DIAGNOSIS — R972 Elevated prostate specific antigen [PSA]: Secondary | ICD-10-CM | POA: Diagnosis not present

## 2021-11-18 DIAGNOSIS — E7800 Pure hypercholesterolemia, unspecified: Secondary | ICD-10-CM | POA: Diagnosis not present

## 2021-11-18 DIAGNOSIS — E039 Hypothyroidism, unspecified: Secondary | ICD-10-CM | POA: Diagnosis not present

## 2021-11-18 DIAGNOSIS — G0491 Myelitis, unspecified: Secondary | ICD-10-CM | POA: Diagnosis not present

## 2021-11-18 DIAGNOSIS — I2510 Atherosclerotic heart disease of native coronary artery without angina pectoris: Secondary | ICD-10-CM | POA: Diagnosis not present

## 2021-11-18 DIAGNOSIS — Z6825 Body mass index (BMI) 25.0-25.9, adult: Secondary | ICD-10-CM | POA: Diagnosis not present

## 2021-11-18 DIAGNOSIS — M545 Low back pain, unspecified: Secondary | ICD-10-CM | POA: Diagnosis not present

## 2021-11-18 DIAGNOSIS — G373 Acute transverse myelitis in demyelinating disease of central nervous system: Secondary | ICD-10-CM | POA: Diagnosis not present

## 2021-11-18 DIAGNOSIS — H53002 Unspecified amblyopia, left eye: Secondary | ICD-10-CM | POA: Diagnosis not present

## 2021-11-18 DIAGNOSIS — M4802 Spinal stenosis, cervical region: Secondary | ICD-10-CM | POA: Diagnosis not present

## 2021-11-22 DIAGNOSIS — M0600 Rheumatoid arthritis without rheumatoid factor, unspecified site: Secondary | ICD-10-CM | POA: Diagnosis not present

## 2021-11-25 DIAGNOSIS — D485 Neoplasm of uncertain behavior of skin: Secondary | ICD-10-CM | POA: Diagnosis not present

## 2021-11-25 DIAGNOSIS — L821 Other seborrheic keratosis: Secondary | ICD-10-CM | POA: Diagnosis not present

## 2021-11-25 DIAGNOSIS — D225 Melanocytic nevi of trunk: Secondary | ICD-10-CM | POA: Diagnosis not present

## 2021-11-25 DIAGNOSIS — L814 Other melanin hyperpigmentation: Secondary | ICD-10-CM | POA: Diagnosis not present

## 2021-11-25 DIAGNOSIS — Z85828 Personal history of other malignant neoplasm of skin: Secondary | ICD-10-CM | POA: Diagnosis not present

## 2021-11-25 DIAGNOSIS — D1801 Hemangioma of skin and subcutaneous tissue: Secondary | ICD-10-CM | POA: Diagnosis not present

## 2021-11-25 DIAGNOSIS — L820 Inflamed seborrheic keratosis: Secondary | ICD-10-CM | POA: Diagnosis not present

## 2021-11-26 DIAGNOSIS — Z79899 Other long term (current) drug therapy: Secondary | ICD-10-CM | POA: Diagnosis not present

## 2021-11-26 DIAGNOSIS — M1990 Unspecified osteoarthritis, unspecified site: Secondary | ICD-10-CM | POA: Diagnosis not present

## 2021-11-26 DIAGNOSIS — M0609 Rheumatoid arthritis without rheumatoid factor, multiple sites: Secondary | ICD-10-CM | POA: Diagnosis not present

## 2021-11-26 DIAGNOSIS — N1831 Chronic kidney disease, stage 3a: Secondary | ICD-10-CM | POA: Diagnosis not present

## 2021-11-26 DIAGNOSIS — R944 Abnormal results of kidney function studies: Secondary | ICD-10-CM | POA: Diagnosis not present

## 2021-11-26 DIAGNOSIS — R768 Other specified abnormal immunological findings in serum: Secondary | ICD-10-CM | POA: Diagnosis not present

## 2021-11-26 DIAGNOSIS — M79643 Pain in unspecified hand: Secondary | ICD-10-CM | POA: Diagnosis not present

## 2021-11-26 DIAGNOSIS — M7989 Other specified soft tissue disorders: Secondary | ICD-10-CM | POA: Diagnosis not present

## 2021-12-11 DIAGNOSIS — I129 Hypertensive chronic kidney disease with stage 1 through stage 4 chronic kidney disease, or unspecified chronic kidney disease: Secondary | ICD-10-CM | POA: Diagnosis not present

## 2021-12-11 DIAGNOSIS — E039 Hypothyroidism, unspecified: Secondary | ICD-10-CM | POA: Diagnosis not present

## 2021-12-11 DIAGNOSIS — N1831 Chronic kidney disease, stage 3a: Secondary | ICD-10-CM | POA: Diagnosis not present

## 2021-12-11 DIAGNOSIS — E7800 Pure hypercholesterolemia, unspecified: Secondary | ICD-10-CM | POA: Diagnosis not present

## 2021-12-30 DIAGNOSIS — H2511 Age-related nuclear cataract, right eye: Secondary | ICD-10-CM | POA: Diagnosis not present

## 2021-12-30 DIAGNOSIS — H25811 Combined forms of age-related cataract, right eye: Secondary | ICD-10-CM | POA: Diagnosis not present

## 2021-12-30 DIAGNOSIS — H21561 Pupillary abnormality, right eye: Secondary | ICD-10-CM | POA: Diagnosis not present

## 2022-01-12 DIAGNOSIS — N1831 Chronic kidney disease, stage 3a: Secondary | ICD-10-CM | POA: Diagnosis not present

## 2022-01-12 DIAGNOSIS — E7800 Pure hypercholesterolemia, unspecified: Secondary | ICD-10-CM | POA: Diagnosis not present

## 2022-01-12 DIAGNOSIS — I129 Hypertensive chronic kidney disease with stage 1 through stage 4 chronic kidney disease, or unspecified chronic kidney disease: Secondary | ICD-10-CM | POA: Diagnosis not present

## 2022-01-12 DIAGNOSIS — E039 Hypothyroidism, unspecified: Secondary | ICD-10-CM | POA: Diagnosis not present

## 2022-02-09 DIAGNOSIS — E039 Hypothyroidism, unspecified: Secondary | ICD-10-CM | POA: Diagnosis not present

## 2022-02-09 DIAGNOSIS — N1831 Chronic kidney disease, stage 3a: Secondary | ICD-10-CM | POA: Diagnosis not present

## 2022-02-09 DIAGNOSIS — E7800 Pure hypercholesterolemia, unspecified: Secondary | ICD-10-CM | POA: Diagnosis not present

## 2022-02-09 DIAGNOSIS — I129 Hypertensive chronic kidney disease with stage 1 through stage 4 chronic kidney disease, or unspecified chronic kidney disease: Secondary | ICD-10-CM | POA: Diagnosis not present

## 2022-02-19 DIAGNOSIS — H02002 Unspecified entropion of right lower eyelid: Secondary | ICD-10-CM | POA: Diagnosis not present

## 2022-03-12 DIAGNOSIS — I129 Hypertensive chronic kidney disease with stage 1 through stage 4 chronic kidney disease, or unspecified chronic kidney disease: Secondary | ICD-10-CM | POA: Diagnosis not present

## 2022-03-12 DIAGNOSIS — N1831 Chronic kidney disease, stage 3a: Secondary | ICD-10-CM | POA: Diagnosis not present

## 2022-03-12 DIAGNOSIS — E7800 Pure hypercholesterolemia, unspecified: Secondary | ICD-10-CM | POA: Diagnosis not present

## 2022-03-12 DIAGNOSIS — E039 Hypothyroidism, unspecified: Secondary | ICD-10-CM | POA: Diagnosis not present

## 2022-03-17 DIAGNOSIS — R7303 Prediabetes: Secondary | ICD-10-CM | POA: Diagnosis not present

## 2022-03-17 DIAGNOSIS — E039 Hypothyroidism, unspecified: Secondary | ICD-10-CM | POA: Diagnosis not present

## 2022-03-17 DIAGNOSIS — I10 Essential (primary) hypertension: Secondary | ICD-10-CM | POA: Diagnosis not present

## 2022-03-17 DIAGNOSIS — E782 Mixed hyperlipidemia: Secondary | ICD-10-CM | POA: Diagnosis not present

## 2022-03-17 DIAGNOSIS — I2510 Atherosclerotic heart disease of native coronary artery without angina pectoris: Secondary | ICD-10-CM | POA: Diagnosis not present

## 2022-03-17 DIAGNOSIS — N1831 Chronic kidney disease, stage 3a: Secondary | ICD-10-CM | POA: Diagnosis not present

## 2022-03-17 DIAGNOSIS — M79606 Pain in leg, unspecified: Secondary | ICD-10-CM | POA: Diagnosis not present

## 2022-03-29 NOTE — Progress Notes (Signed)
? ? ?  Subjective:   ? ?CC: Low back pain ? ?I, Alec Snyder, LAT, ATC, am serving as scribe for [name]. ? ?HPI: Pt is an 81 y/o male presenting w/ c/o R low back pain x approximately one month.  He locates his pain to his R lower back w/ radiating pain into his R ant thigh. ? ?Radiating pain: yes into the R ant thigh ?LE paresthesias: no ?LE weakness: no ?Aggravating factors: lumbar flexion; prolonged sitting; transitioning from sit-to-stand or supine to stand; at night in bed ?Treatments tried: IBU; Tylenol;  ? ?Pertinent review of Systems: no fever or chills ? ?Relevant historical information: Hx transverse myelitis and rheumatoid arthritis ? ? ?Objective:   ? ?Vitals:  ? 03/30/22 1027  ?BP: 140/80  ?Pulse: 63  ?SpO2: 94%  ? ?General: Well Developed, well nourished, and in no acute distress.  ? ?MSK: Lspine: Normal appearing.  ?Nontender to midline.  ?Decreased lumbar range of motion. ?Lower extremity strength is intact except noted below. ?Reflexes are intact. ?Mildly positive slump test. ? ?Right hip normal-appearing ?Mildly tender palpation at greater trochanter. ?Normal hip motion. ?Strength diminished hip abduction and external rotation with mild pain. ? ? ?Lab and Radiology Results ? ?X-ray images lumbar spine obtained today personally and independently interpreted. ?Significant DDD L5-S1 and L4-L5.  No acute fractures are present. ?Await formal radiology review ? ? ?Impression and Recommendations:   ? ?Assessment and Plan: ?81 y.o. male with right lateral hip pain and anterior thigh pain.  Pain is thought to be multifactorial primarily due to hip abductor tendinopathy and weakness and possible L3 lumbar radiculopathy. ? ?Plan for course of oral prednisone and low-dose gabapentin and trial of physical therapy.  Plan for recheck in about a month or sooner if needed. ? ?He does have a relevant history of transverse myelitis affecting the thoracic spine and of rheumatoid arthritis although I think  neither issue is currently causing his symptoms today.  Await radiology overread x-ray lumbar spine.. ? ?PDMP not reviewed this encounter. ?Orders Placed This Encounter  ?Procedures  ? DG Lumbar Spine Complete  ?  Standing Status:   Future  ?  Number of Occurrences:   1  ?  Standing Expiration Date:   04/29/2022  ?  Order Specific Question:   Reason for Exam (SYMPTOM  OR DIAGNOSIS REQUIRED)  ?  Answer:   low back pain  ?  Order Specific Question:   Preferred imaging location?  ?  Answer:   [facility]  ? Ambulatory referral to Physical Therapy  ?  Referral Priority:   Routine  ?  Referral Type:   Physical Medicine  ?  Referral Reason:   Specialty Services Required  ?  Requested Specialty:   Physical Therapy  ?  Number of Visits Requested:   1  ? ?Meds ordered this encounter  ?Medications  ? predniSONE (DELTASONE) 50 MG tablet  ?  Sig: Take 1 tablet (50 mg total) by mouth daily.  ?  Dispense:  5 tablet  ?  Refill:  0  ? gabapentin (NEURONTIN) 100 MG capsule  ?  Sig: Take 1-3 capsules (100-300 mg total) by mouth 3 (three) times daily as needed.  ?  Dispense:  90 capsule  ?  Refill:  3  ? ? ?Discussed warning signs or symptoms. Please see discharge instructions. Patient expresses understanding. ? ? ?The above documentation has been reviewed and is accurate and complete Lynne Leader, M.D. ? ?

## 2022-03-30 ENCOUNTER — Ambulatory Visit (INDEPENDENT_AMBULATORY_CARE_PROVIDER_SITE_OTHER): Payer: Medicare Other | Admitting: Family Medicine

## 2022-03-30 ENCOUNTER — Ambulatory Visit (INDEPENDENT_AMBULATORY_CARE_PROVIDER_SITE_OTHER): Payer: Medicare Other

## 2022-03-30 ENCOUNTER — Encounter: Payer: Self-pay | Admitting: Family Medicine

## 2022-03-30 VITALS — BP 140/80 | HR 63 | Ht 68.0 in | Wt 160.4 lb

## 2022-03-30 DIAGNOSIS — I359 Nonrheumatic aortic valve disorder, unspecified: Secondary | ICD-10-CM | POA: Insufficient documentation

## 2022-03-30 DIAGNOSIS — G8929 Other chronic pain: Secondary | ICD-10-CM | POA: Diagnosis not present

## 2022-03-30 DIAGNOSIS — R972 Elevated prostate specific antigen [PSA]: Secondary | ICD-10-CM | POA: Insufficient documentation

## 2022-03-30 DIAGNOSIS — E538 Deficiency of other specified B group vitamins: Secondary | ICD-10-CM | POA: Insufficient documentation

## 2022-03-30 DIAGNOSIS — M19049 Primary osteoarthritis, unspecified hand: Secondary | ICD-10-CM | POA: Insufficient documentation

## 2022-03-30 DIAGNOSIS — Z789 Other specified health status: Secondary | ICD-10-CM | POA: Insufficient documentation

## 2022-03-30 DIAGNOSIS — M5441 Lumbago with sciatica, right side: Secondary | ICD-10-CM | POA: Diagnosis not present

## 2022-03-30 DIAGNOSIS — M25551 Pain in right hip: Secondary | ICD-10-CM | POA: Diagnosis not present

## 2022-03-30 DIAGNOSIS — R7303 Prediabetes: Secondary | ICD-10-CM | POA: Insufficient documentation

## 2022-03-30 DIAGNOSIS — G373 Acute transverse myelitis in demyelinating disease of central nervous system: Secondary | ICD-10-CM | POA: Insufficient documentation

## 2022-03-30 DIAGNOSIS — M069 Rheumatoid arthritis, unspecified: Secondary | ICD-10-CM | POA: Insufficient documentation

## 2022-03-30 DIAGNOSIS — M545 Low back pain, unspecified: Secondary | ICD-10-CM | POA: Diagnosis not present

## 2022-03-30 DIAGNOSIS — I700 Atherosclerosis of aorta: Secondary | ICD-10-CM | POA: Insufficient documentation

## 2022-03-30 MED ORDER — PREDNISONE 50 MG PO TABS: 50.0000 mg | ORAL_TABLET | Freq: Every day | ORAL | 0 refills | Status: DC

## 2022-03-30 MED ORDER — GABAPENTIN 100 MG PO CAPS: 100.0000 mg | ORAL_CAPSULE | Freq: Three times a day (TID) | ORAL | 3 refills | Status: DC | PRN

## 2022-03-30 NOTE — Patient Instructions (Addendum)
Nice to meet you today. ? ?I've prescribed prednisone and gabapentin. ? ?I've referred you to Physical Therapy.  Their office will call you to schedule but please let us know if you don't hear from them in one week regarding scheduling. ? ?Follow-up: one month ?

## 2022-04-01 NOTE — Progress Notes (Signed)
Lumbar spine x-ray shows arthritis changes in the low back.  Worst level is L4-5.

## 2022-04-01 NOTE — Therapy (Signed)
?OUTPATIENT PHYSICAL THERAPY THORACOLUMBAR EVALUATION ? ? ?Patient Name: Aayansh Codispoti ?MRN: [id number] ?DOB:[birth year], 81 y.o., male ?Today's Date: 04/05/2022 ? ? PT End of Session - 04/05/22 1018   ? ? Visit Number 1   ? Number of Visits 12   ? Date for PT Re-Evaluation 05/17/22   ? Authorization Type medicare   ? Progress Note Due on Visit 10   ? PT Start Time 1021   ? PT Stop Time 1059   ? PT Time Calculation (min) 38 min   ? ?  ?  ? ?  ? ? ?Past Medical History:  ?Diagnosis Date  ? Arthritis   ? in hands  ? Cancer [facility])   ? melanoma/ on hand and leg  ? Hemorrhoids   ? occasional  ? Hx of adenomatous colonic polyps 06/30/2017  ? Hyperlipemia   ? Hypothyroid   ? Inguinal hernia   ? bilateral  ? Joint pain   ? minor  ? Transverse myelitis ([place])   ? Wears glasses   ? ?Past Surgical History:  ?Procedure Laterality Date  ? BLEPHAROPLASTY Bilateral 2016  ? COLONOSCOPY    ? HERNIA REPAIR  2012  ? BIH  ? MELANOMA EXCISION    ? ?Patient Active Problem List  ? Diagnosis Date Noted  ? Aortic valve disorder 03/30/2022  ? Hardening of the aorta (main artery of the heart) ([place]) 03/30/2022  ? Elevated PSA 03/30/2022  ? Localized, primary osteoarthritis of hand 03/30/2022  ? Transverse myelitis ([place]) 03/30/2022  ? Rheumatoid arthritis ([place]) 03/30/2022  ? Other specified health status 03/30/2022  ? Prediabetes 03/30/2022  ? Vitamin B12 deficiency (non anemic) 03/30/2022  ? Exertional dyspnea 04/10/2020  ? Essential hypertension 06/23/2019  ? 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 Hx of adenomatous colonic polyps 06/30/2017  ? Hypothyroidism 06/06/2017  ? Hyperlipidemia 05/14/2014  ? Inguinal hernia bilateral, non-recurrent 06/14/2011  ? ? ?PCP: Deland Pretty, MD ? ?REFERRING PROVIDER: Gregor Hams, MD ? ?REFERRING DIAG: M54.41,G89.29 (ICD-10-CM) - Chronic right-sided low back pain with right-sided sciatica ?M25.551 (ICD-10-CM) - Right hip pain ? ?THERAPY DIAG:  ?Other low back pain ? ?Muscle weakness  (generalized) ? ?Pain in right hip ? ?ONSET DATE: about one month for back pain ? ?SUBJECTIVE:                                                                                                                                                                                          ? ?SUBJECTIVE STATEMENT: ?States that he has been having some symptoms in his leg and buttocks. States he saw the MD and he thinks it is a  pinched nerve. States that he has felt a little bit better as he has taken the medication. States that the morning is the worse and as he walks it feels better. States that some positions. States that he was having back pain for about a month but it wasn't terrible and then on 03/29/22 he has severe pain in his leg.  States he wants to get back to golf. He notes he has been sitting more than usual ? ?PERTINENT HISTORY:  ?RA and  transverse myelytis, hx of low back pain ? ?PAIN:  ?Are you having pain? Yes: NPRS scale: at worse 8/10 ?Pain location: right buttocks ?Pain description: sharp  ?Aggravating factors: sitting down, getting up ?Relieving factors: medication  ? ? ?PRECAUTIONS: None ? ?WEIGHT BEARING RESTRICTIONS No ? ?FALLS:  ?Has patient fallen in last 6 months? No ? ? ?OCCUPATION: investor - part time worker ? ?PLOF: Independent ? ?PATIENT GOALS ikes to play golf but currently unable to ? ? ?OBJECTIVE:  ? ?DIAGNOSTIC FINDINGS:  ?Xray 03/31/22 ?IMPRESSION: ?No recent fracture is seen in the lumbar spine. Lumbar spondylosis ?with disc space narrowing, bony spurs and facet hypertrophy. There ?is interval worsening of degenerative changes in the lumbar spine, ?particularly at L4-L5 level. ? ? ?SCREENING FOR RED FLAGS: ?Bowel or bladder incontinence: No ?Spinal tumors: No ?Cauda equina syndrome: No ?Compression fracture: No ?Abdominal aneurysm: No ? ?COGNITION: ? Overall cognitive status: Within functional limits for tasks assessed   ?  ?SENSATION: ?WFL ? ? ?POSTURE:  ?Sacral sitting, forward head,  rounded shoulders. ? ?PALPATION: ?Tenderness to palpation along lumbar paraspinals  ? ?LUMBAR ROM:  ? ?Active  A/PROM  ?04/05/2022  ?Flexion 75% limited pain down leg  ?Extension 100% limited less pain  ?Right lateral flexion 75% limited pain down leg  ?Left lateral flexion 75% limited pain down leg  ?Right rotation   ?Left rotation   ? (Blank rows = not tested) ? ? ? ? LE Measurements ?Lower Extremity Right ?04/05/2022 Left ?04/05/2022  ? A/PROM MMT A/PROM MMT  ?Hip Flexion  4-  4  ?Hip Extension      ?Hip Abduction      ?Hip Adduction      ?Hip Internal rotation      ?Hip External rotation      ?Knee Flexion  4-  4  ?Knee Extension  4-  4  ?Ankle Dorsiflexion  4  4+  ?Ankle Plantarflexion      ?Ankle Inversion      ?Ankle Eversion      ? (Blank rows = not tested) ? * pain ? ? ?LUMBAR SPECIAL TESTS:  ?Slump test: Positive on right ?Unable to lay prone to test repeated extension, very tight in hip flexors ? ?FUNCTIONAL TESTS:  ?STS - difficult requires use of UE ? ? ? ?TODAY'S TREATMENT  ?04/05/2022 ? ?Therapeutic Exercise: ? Aerobic: ?Supine: ?Prone: unable to get into this position ? Seated: ? Standing: self mobilization with tennis ball to lumbar musculature 5 minutes -- tolerated well ?Neuromuscular Re-education: posture education sitting on sit bones not tail bone ?Manual Therapy: ?Therapeutic Activity: ?Self Care: ?Trigger Point Dry Needling:  ?Modalities:  ? ? ? ?PATIENT EDUCATION:  ?Education details: on current presentation, on HEP, on clinical outcomes score and POC, on use of lumbar support (painful) ?Person educated: Patient ?Education method: Explanation, Demonstration, and Handouts ?Education comprehension: verbalized understanding ? ? ? ?HOME EXERCISE PROGRAM: ?MQDAGATE ? ?ASSESSMENT: ? ?CLINICAL IMPRESSION: ?Patient is a 81 y.o. male who was seen today  for physical therapy evaluation and treatment for lumbar pain radiating into right leg. Most limitations occur first thing in the morning, and  transitional movements after sitting for long period of time. Educated patient on posture and effect prolonged postures has on lumbar spine. Patient would greatly benefit from skilled PT to improve overall function and QOL. ? ? ?OBJECTIVE IMPAIRMENTS decreased activity tolerance, decreased ROM, decreased strength, impaired perceived functional ability, impaired flexibility, improper body mechanics, postural dysfunction, and pain.  ? ?ACTIVITY LIMITATIONS community activity, occupation, yard work, and Office manager .  ? ?PERSONAL FACTORS Age, Fitness, and 1 comorbidity: transverse myelytis  are also affecting patient's functional outcome.  ? ? ?REHAB POTENTIAL: Good ? ?CLINICAL DECISION MAKING: Stable/uncomplicated ? ?EVALUATION COMPLEXITY: Low ? ? ?GOALS: ?Goals reviewed with patient?  yes ? ?SHORT TERM GOALS: ? ?Patient will be independent in self management strategies to improve quality of life and functional outcomes. ?Baseline: new program ?Target date: 04/26/2022 ?Goal status: INITIAL ? ?2.  Patient will report at least 50% improvement in overall symptoms and/or function to demonstrate improved functional mobility ?Baseline: 0% ?Target date: 04/26/2022 ?Goal status: INITIAL ? ?3.  Patient will be able to get up out of chair without use of arms or reported stiffness afterwards ?Baseline: unable ?Target date: 04/26/2022 ?Goal status: INITIAL ? ? ? ? ?LONG TERM GOALS: ? ?Patient will report at least 75% improvement in overall symptoms and/or function to demonstrate improved functional mobility ?Baseline: 0% ?Target date: 05/17/2022 ?Goal status: INITIAL ? ?2.  Patient will be able to demonstrate lumbar ROM without radicular symptoms. ?Baseline: painful ?Target date: 05/17/2022 ?Goal status: INITIAL ? ?3.  Patient will report returning to golf without difficulties secondary to pain in leg. ?Baseline: not playing golf ?Target date: 05/17/2022 ?Goal status: INITIAL ? ? ? ? ?PLAN: ?PT FREQUENCY: 2x/week ? ?PT DURATION: 6  weeks ? ?PLANNED INTERVENTIONS: Therapeutic exercises, Therapeutic activity, Neuromuscular re-education, Balance training, Gait training, Patient/Family education, Joint mobilization, Dry Needling, Electrical stimulation, Spinal mobilization, Cryother

## 2022-04-05 ENCOUNTER — Ambulatory Visit (INDEPENDENT_AMBULATORY_CARE_PROVIDER_SITE_OTHER): Payer: Medicare Other | Admitting: Physical Therapy

## 2022-04-05 ENCOUNTER — Encounter: Payer: Self-pay | Admitting: Physical Therapy

## 2022-04-05 DIAGNOSIS — M25551 Pain in right hip: Secondary | ICD-10-CM

## 2022-04-05 DIAGNOSIS — M6281 Muscle weakness (generalized): Secondary | ICD-10-CM | POA: Diagnosis not present

## 2022-04-05 DIAGNOSIS — M5459 Other low back pain: Secondary | ICD-10-CM | POA: Diagnosis not present

## 2022-04-07 ENCOUNTER — Encounter: Payer: Medicare Other | Admitting: Physical Therapy

## 2022-04-08 ENCOUNTER — Ambulatory Visit (INDEPENDENT_AMBULATORY_CARE_PROVIDER_SITE_OTHER): Payer: Medicare Other | Admitting: Physical Therapy

## 2022-04-08 ENCOUNTER — Encounter: Payer: Self-pay | Admitting: Physical Therapy

## 2022-04-08 DIAGNOSIS — M6281 Muscle weakness (generalized): Secondary | ICD-10-CM | POA: Diagnosis not present

## 2022-04-08 DIAGNOSIS — M5459 Other low back pain: Secondary | ICD-10-CM

## 2022-04-08 DIAGNOSIS — M25551 Pain in right hip: Secondary | ICD-10-CM

## 2022-04-08 NOTE — Therapy (Signed)
?OUTPATIENT PHYSICAL THERAPY TREATMENT ? ? ?Patient Name: Alec Snyder ?MRN: [id number] ?DOB:03/02/1941, 81 y.o., male ?Today's Date: 04/08/2022 ? ? PT End of Session - 04/08/22 2029   ? ? Visit Number 2   ? Number of Visits 12   ? Date for PT Re-Evaluation 05/17/22   ? Authorization Type medicare   ? Progress Note Due on Visit 10   ? PT Start Time 1103   ? PT Stop Time 2694   ? PT Time Calculation (min) 39 min   ? Activity Tolerance Patient limited by pain   ? Behavior During Therapy [facility] for tasks assessed/performed   ? ?  ?  ? ?  ? ? ? ?Past Medical History:  ?Diagnosis Date  ? Arthritis   ? in hands  ? Cancer [facility])   ? melanoma/ on hand and leg  ? Hemorrhoids   ? occasional  ? Hx of adenomatous colonic polyps 06/30/2017  ? Hyperlipemia   ? Hypothyroid   ? Inguinal hernia   ? bilateral  ? Joint pain   ? minor  ? Transverse myelitis ([place])   ? Wears glasses   ? ?Past Surgical History:  ?Procedure Laterality Date  ? BLEPHAROPLASTY Bilateral 2016  ? COLONOSCOPY    ? HERNIA REPAIR  2012  ? BIH  ? MELANOMA EXCISION    ? ?Patient Active Problem List  ? Diagnosis Date Noted  ? Aortic valve disorder 03/30/2022  ? Hardening of the aorta (main artery of the heart) ([place]) 03/30/2022  ? Elevated PSA 03/30/2022  ? Localized, primary osteoarthritis of hand 03/30/2022  ? Transverse myelitis ([place]) 03/30/2022  ? Rheumatoid arthritis ([place]) 03/30/2022  ? Other specified health status 03/30/2022  ? Prediabetes 03/30/2022  ? Vitamin B12 deficiency (non anemic) 03/30/2022  ? Exertional dyspnea 04/10/2020  ? Essential hypertension 06/23/2019  ? 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 Hx of adenomatous colonic polyps 06/30/2017  ? Hypothyroidism 06/06/2017  ? Hyperlipidemia 05/14/2014  ? Inguinal hernia bilateral, non-recurrent 06/14/2011  ? ? ?PCP: Deland Pretty, MD ? ?REFERRING PROVIDER: Lynne Leader  ? ?REFERRING DIAG: M54.41,G89.29 (ICD-10-CM) - Chronic right-sided low back pain with right-sided sciatica ?M25.551 (ICD-10-CM) -  Right hip pain ? ?THERAPY DIAG:  ?Other low back pain ? ?Muscle weakness (generalized) ? ?Pain in right hip ? ?ONSET DATE: about one month for back pain ? ?SUBJECTIVE:                                                                                                                                                                                          ? ?SUBJECTIVE STATEMENT: ?04/08/22: Pt states continued pain. He  has quite a bit of pain in the mornings, better when he "gets moving".  ? ?EvaL: States that he has been having some symptoms in his leg and buttocks. States he saw the MD and he thinks it is a pinched nerve. States that he has felt a little bit better as he has taken the medication. States that the morning is the worse and as he walks it feels better. States that some positions. States that he was having back pain for about a month but it wasn't terrible and then on 03/29/22 he has severe pain in his leg.  States he wants to get back to golf. He notes he has been sitting more than usual ? ?PERTINENT HISTORY:  ?RA and  transverse myelytis, hx of low back pain ? ?PAIN:  ?Are you having pain? Yes: NPRS scale: at worse 8/10 ?Pain location: right buttocks ?Pain description: sharp  ?Aggravating factors: sitting down, getting up ?Relieving factors: medication  ? ? ?PRECAUTIONS: None ? ?WEIGHT BEARING RESTRICTIONS No ? ?FALLS:  ?Has patient fallen in last 6 months? No ? ? ?OCCUPATION: investor - part time worker ? ?PLOF: Independent ? ?PATIENT GOALS ikes to play golf but currently unable to ? ? ?OBJECTIVE:  ? ?DIAGNOSTIC FINDINGS:  ?Xray 03/31/22 ?IMPRESSION: ?No recent fracture is seen in the lumbar spine. Lumbar spondylosis ?with disc space narrowing, bony spurs and facet hypertrophy. There ?is interval worsening of degenerative changes in the lumbar spine, ?particularly at L4-L5 level. ? ? ?SCREENING FOR RED FLAGS: ?Bowel or bladder incontinence: No ?Spinal tumors: No ?Cauda equina syndrome: No ?Compression  fracture: No ?Abdominal aneurysm: No ? ?COGNITION: ? Overall cognitive status: Within functional limits for tasks assessed   ?  ?SENSATION: ?WFL ? ? ?POSTURE:  ?Sacral sitting, forward head, rounded shoulders. ? ?PALPATION: ?Tenderness to palpation along lumbar paraspinals  ? ?LUMBAR ROM:  ? ?Active  A/PROM  ?04/05/2022  ?Flexion 75% limited pain down leg  ?Extension 100% limited less pain  ?Right lateral flexion 75% limited pain down leg  ?Left lateral flexion 75% limited pain down leg  ?Right rotation   ?Left rotation   ? (Blank rows = not tested) ? ? ? ? LE Measurements ?Lower Extremity Right ?04/05/2022 Left ?04/05/2022  ? A/PROM MMT A/PROM MMT  ?Hip Flexion  4-  4  ?Hip Extension      ?Hip Abduction      ?Hip Adduction      ?Hip Internal rotation      ?Hip External rotation      ?Knee Flexion  4-  4  ?Knee Extension  4-  4  ?Ankle Dorsiflexion  4  4+  ?Ankle Plantarflexion      ?Ankle Inversion      ?Ankle Eversion      ? (Blank rows = not tested) ? * pain ? ? ?LUMBAR SPECIAL TESTS:  ?Slump test: Positive on right ?Unable to lay prone to test repeated extension, very tight in hip flexors ? ?FUNCTIONAL TESTS:  ?STS - difficult requires use of UE ? ? ? ?TODAY'S TREATMENT  ?04/08/2022 ?Therapeutic Exercise: ? Aerobic: ?  Supine: Trial for pelvic tilts-painful;  Unable to perform SKTC-pain;  ?   Prone:  ? Seated: Pelvic tilts x 15;  ? Standing:  ?Neuromuscular Re-education:  ?Manual Therapy: DTM/ TPR to R lateral hip and glute;  Long leg distraction on R for lumbar traction;  ?Self Care:  Discussed hooklying or L sidelying positions for sleeping as well as for pain relief/decompression; Education  on optimal seated posture for back pain;  ? ? ? ? ?PATIENT EDUCATION:  ?Education details: updated HEP ?Person educated: Patient ?Education method: Explanation, Demonstration, and Handouts ?Education comprehension: verbalized understanding ? ?HOME EXERCISE PROGRAM: ?[place] ? ?ASSESSMENT: ? ?CLINICAL IMPRESSION: ?04/08/22:  Pt  with significant pain today with most movements and attempt for ther ex. He is unable to perform flexion stretches for back due to increased pain and radicular pain. He was able to tolerated seated pelvic tilts, and did have relief of symptoms with manual traction. Discussed offloading positioning for pain and sleeping as well as seated posture. Pt with mild tenderness in R hip musculature today, but did not reproduce pain symptoms.  ? ?Eval: Patient is a 81 y.o. male who was seen today for physical therapy evaluation and treatment for lumbar pain radiating into right leg. Most limitations occur first thing in the morning, and transitional movements after sitting for long period of time. Educated patient on posture and effect prolonged postures has on lumbar spine. Patient would greatly benefit from skilled PT to improve overall function and QOL. ? ? ?OBJECTIVE IMPAIRMENTS decreased activity tolerance, decreased ROM, decreased strength, impaired perceived functional ability, impaired flexibility, improper body mechanics, postural dysfunction, and pain.  ? ?ACTIVITY LIMITATIONS community activity, occupation, yard work, and Office manager .  ? ?PERSONAL FACTORS Age, Fitness, and 1 comorbidity: transverse myelytis  are also affecting patient's functional outcome.  ? ? ?REHAB POTENTIAL: Good ? ?CLINICAL DECISION MAKING: Stable/uncomplicated ? ?EVALUATION COMPLEXITY: Low ? ? ?GOALS: ?Goals reviewed with patient?  yes ? ?SHORT TERM GOALS: ? ?Patient will be independent in self management strategies to improve quality of life and functional outcomes. ?Baseline: new program ?Target date: 04/29/2022 ?Goal status: INITIAL ? ?2.  Patient will report at least 50% improvement in overall symptoms and/or function to demonstrate improved functional mobility ?Baseline: 0% ?Target date: 04/29/2022 ?Goal status: INITIAL ? ?3.  Patient will be able to get up out of chair without use of arms or reported stiffness afterwards ?Baseline:  unable ?Target date: 04/29/2022 ?Goal status: INITIAL ? ? ? ? ?LONG TERM GOALS: ? ?Patient will report at least 75% improvement in overall symptoms and/or function to demonstrate improved functional mobility ?Baseline: 0%

## 2022-04-11 DIAGNOSIS — E039 Hypothyroidism, unspecified: Secondary | ICD-10-CM | POA: Diagnosis not present

## 2022-04-11 DIAGNOSIS — I129 Hypertensive chronic kidney disease with stage 1 through stage 4 chronic kidney disease, or unspecified chronic kidney disease: Secondary | ICD-10-CM | POA: Diagnosis not present

## 2022-04-11 DIAGNOSIS — M1831 Unilateral post-traumatic osteoarthritis of first carpometacarpal joint, right hand: Secondary | ICD-10-CM | POA: Diagnosis not present

## 2022-04-11 DIAGNOSIS — E7800 Pure hypercholesterolemia, unspecified: Secondary | ICD-10-CM | POA: Diagnosis not present

## 2022-04-12 ENCOUNTER — Encounter: Payer: Self-pay | Admitting: Physical Therapy

## 2022-04-12 ENCOUNTER — Ambulatory Visit (INDEPENDENT_AMBULATORY_CARE_PROVIDER_SITE_OTHER): Payer: Medicare Other | Admitting: Physical Therapy

## 2022-04-12 DIAGNOSIS — M6281 Muscle weakness (generalized): Secondary | ICD-10-CM

## 2022-04-12 DIAGNOSIS — M25551 Pain in right hip: Secondary | ICD-10-CM

## 2022-04-12 DIAGNOSIS — M5459 Other low back pain: Secondary | ICD-10-CM | POA: Diagnosis not present

## 2022-04-12 NOTE — Therapy (Signed)
?OUTPATIENT PHYSICAL THERAPY TREATMENT ? ? ?Patient Name: Alec Snyder ?MRN: [id number] ?DOB:1941-06-26, 81 y.o., male ?Today's Date: 04/12/2022 ? ? PT End of Session - 04/12/22 1626   ? ? Visit Number 3   ? Number of Visits 12   ? Date for PT Re-Evaluation 05/17/22   ? Authorization Type medicare   ? Progress Note Due on Visit 10   ? PT Start Time 1348   ? PT Stop Time 1428   ? PT Time Calculation (min) 40 min   ? Activity Tolerance Patient limited by pain   ? Behavior During Therapy [facility] for tasks assessed/performed   ? ?  ?  ? ?  ? ? ? ? ?Past Medical History:  ?Diagnosis Date  ? Arthritis   ? in hands  ? Cancer [facility])   ? melanoma/ on hand and leg  ? Hemorrhoids   ? occasional  ? Hx of adenomatous colonic polyps 06/30/2017  ? Hyperlipemia   ? Hypothyroid   ? Inguinal hernia   ? bilateral  ? Joint pain   ? minor  ? Transverse myelitis ([place])   ? Wears glasses   ? ?Past Surgical History:  ?Procedure Laterality Date  ? BLEPHAROPLASTY Bilateral 2016  ? COLONOSCOPY    ? HERNIA REPAIR  2012  ? BIH  ? MELANOMA EXCISION    ? ?Patient Active Problem List  ? Diagnosis Date Noted  ? Aortic valve disorder 03/30/2022  ? Hardening of the aorta (main artery of the heart) ([place]) 03/30/2022  ? Elevated PSA 03/30/2022  ? Localized, primary osteoarthritis of hand 03/30/2022  ? Transverse myelitis ([place]) 03/30/2022  ? Rheumatoid arthritis ([place]) 03/30/2022  ? Other specified health status 03/30/2022  ? Prediabetes 03/30/2022  ? Vitamin B12 deficiency (non anemic) 03/30/2022  ? Exertional dyspnea 04/10/2020  ? Essential hypertension 06/23/2019  ? 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 Hx of adenomatous colonic polyps 06/30/2017  ? Hypothyroidism 06/06/2017  ? Hyperlipidemia 05/14/2014  ? Inguinal hernia bilateral, non-recurrent 06/14/2011  ? ? ?PCP: Deland Pretty, MD ? ?REFERRING PROVIDER: Lynne Leader  ? ?REFERRING DIAG: M54.41,G89.29 (ICD-10-CM) - Chronic right-sided low back pain with right-sided sciatica ?M25.551 (ICD-10-CM) -  Right hip pain ? ?THERAPY DIAG:  ?Other low back pain ? ?Muscle weakness (generalized) ? ?Pain in right hip ? ?ONSET DATE: about one month for back pain ? ?SUBJECTIVE:                                                                                                                                                                                          ? ?SUBJECTIVE STATEMENT: ?Pt states slight improvement of  pain, less pain in AM and was able to walk a bit better with slightly less pain.  ? ?EvaL: States that he has been having some symptoms in his leg and buttocks. States he saw the MD and he thinks it is a pinched nerve. States that he has felt a little bit better as he has taken the medication. States that the morning is the worse and as he walks it feels better. States that some positions. States that he was having back pain for about a month but it wasn't terrible and then on 03/29/22 he has severe pain in his leg.  States he wants to get back to golf. He notes he has been sitting more than usual ? ?PERTINENT HISTORY:  ?RA and  transverse myelytis, hx of low back pain ? ?PAIN:  ?Are you having pain? Yes: NPRS scale: at worse 8/10 ?Pain location: right buttocks ?Pain description: sharp  ?Aggravating factors: sitting down, getting up ?Relieving factors: medication  ? ? ?PRECAUTIONS: None ? ?WEIGHT BEARING RESTRICTIONS No ? ?FALLS:  ?Has patient fallen in last 6 months? No ? ? ?OCCUPATION: investor - part time worker ? ?PLOF: Independent ? ?PATIENT GOALS ikes to play golf but currently unable to ? ? ?OBJECTIVE:  ? ?DIAGNOSTIC FINDINGS:  ?Xray 03/31/22 ?IMPRESSION: ?No recent fracture is seen in the lumbar spine. Lumbar spondylosis ?with disc space narrowing, bony spurs and facet hypertrophy. There ?is interval worsening of degenerative changes in the lumbar spine, ?particularly at L4-L5 level. ? ? ?SCREENING FOR RED FLAGS: ?Bowel or bladder incontinence: No ?Spinal tumors: No ?Cauda equina syndrome: No ?Compression  fracture: No ?Abdominal aneurysm: No ? ?COGNITION: ? Overall cognitive status: Within functional limits for tasks assessed   ?  ?SENSATION: ?WFL ? ? ?POSTURE:  ?Sacral sitting, forward head, rounded shoulders. ? ?PALPATION: ?Tenderness to palpation along lumbar paraspinals  ? ?LUMBAR ROM:  ? ?Active  A/PROM  ?04/05/2022  ?Flexion 75% limited pain down leg  ?Extension 100% limited less pain  ?Right lateral flexion 75% limited pain down leg  ?Left lateral flexion 75% limited pain down leg  ?Right rotation   ?Left rotation   ? (Blank rows = not tested) ? ? ? ? LE Measurements ?Lower Extremity Right ?04/05/2022 Left ?04/05/2022  ? A/PROM MMT A/PROM MMT  ?Hip Flexion  4-  4  ?Hip Extension      ?Hip Abduction      ?Hip Adduction      ?Hip Internal rotation      ?Hip External rotation      ?Knee Flexion  4-  4  ?Knee Extension  4-  4  ?Ankle Dorsiflexion  4  4+  ?Ankle Plantarflexion      ?Ankle Inversion      ?Ankle Eversion      ? (Blank rows = not tested) ? * pain ? ? ?LUMBAR SPECIAL TESTS:  ?Slump test: Positive on right ?Unable to lay prone to test repeated extension, very tight in hip flexors ? ?FUNCTIONAL TESTS:  ?STS - difficult requires use of UE ? ? ? ?TODAY'S TREATMENT  ?04/12/2022 ?Therapeutic Exercise: ? Aerobic: ?  Supine:   ?  Prone:  ?Seated: Pelvic tilts x 15;  ?Standing: Trial for extension: painful,  Trial for L side glides: painful, R sideglides: x 10 non painful;  ? ?Neuromuscular Re-education:  ?Manual Therapy: Prone over pillow: Lumbar PA mobs, STM/DTM to R lumbar;  Long leg distraction on R for lumbar traction;  ?Self Care:  Reviewed hooklying or  L sidelying positions for sleeping as well as for pain relief/decompression;  ? ? ? ?PATIENT EDUCATION:  ?Education details: updated HEP ?Person educated: Patient ?Education method: Explanation, Demonstration, and Handouts ?Education comprehension: verbalized understanding ? ?HOME EXERCISE PROGRAM: ?[place] ? ?ASSESSMENT: ? ?CLINICAL IMPRESSION: ?04/12/22: Pt  continues to have significant pain with both flexion and extension. He has LE  symptoms reproduced with trial for both today. He does get relief from decompression positions, Prone over pillow and long leg distraction. He is having slightly less pain this week from last week, and has less pain reproduced , with improved ROM, with trial for hip flexion/knee to chest motion in supine today.  Plan to progress as tolerated, activities limited due to increased pain.  ? ?Eval: Patient is a 81 y.o. male who was seen today for physical therapy evaluation and treatment for lumbar pain radiating into right leg. Most limitations occur first thing in the morning, and transitional movements after sitting for long period of time. Educated patient on posture and effect prolonged postures has on lumbar spine. Patient would greatly benefit from skilled PT to improve overall function and QOL. ? ? ?OBJECTIVE IMPAIRMENTS decreased activity tolerance, decreased ROM, decreased strength, impaired perceived functional ability, impaired flexibility, improper body mechanics, postural dysfunction, and pain.  ? ?ACTIVITY LIMITATIONS community activity, occupation, yard work, and Office manager .  ? ?PERSONAL FACTORS Age, Fitness, and 1 comorbidity: transverse myelytis  are also affecting patient's functional outcome.  ? ? ?REHAB POTENTIAL: Good ? ?CLINICAL DECISION MAKING: Stable/uncomplicated ? ?EVALUATION COMPLEXITY: Low ? ? ?GOALS: ?Goals reviewed with patient?  yes ? ?SHORT TERM GOALS: ? ?Patient will be independent in self management strategies to improve quality of life and functional outcomes. ?Baseline: new program ?Target date: 05/03/2022 ?Goal status: INITIAL ? ?2.  Patient will report at least 50% improvement in overall symptoms and/or function to demonstrate improved functional mobility ?Baseline: 0% ?Target date: 05/03/2022 ?Goal status: INITIAL ? ?3.  Patient will be able to get up out of chair without use of arms or reported stiffness  afterwards ?Baseline: unable ?Target date: 05/03/2022 ?Goal status: INITIAL ? ? ? ? ?LONG TERM GOALS: ? ?Patient will report at least 75% improvement in overall symptoms and/or function to demonstrate improved fu

## 2022-04-15 ENCOUNTER — Encounter: Payer: Self-pay | Admitting: Physical Therapy

## 2022-04-15 ENCOUNTER — Ambulatory Visit (INDEPENDENT_AMBULATORY_CARE_PROVIDER_SITE_OTHER): Payer: Medicare Other | Admitting: Physical Therapy

## 2022-04-15 DIAGNOSIS — M6281 Muscle weakness (generalized): Secondary | ICD-10-CM

## 2022-04-15 DIAGNOSIS — M5459 Other low back pain: Secondary | ICD-10-CM | POA: Diagnosis not present

## 2022-04-15 DIAGNOSIS — M25551 Pain in right hip: Secondary | ICD-10-CM | POA: Diagnosis not present

## 2022-04-15 NOTE — Therapy (Signed)
?OUTPATIENT PHYSICAL THERAPY TREATMENT ? ? ?Patient Name: Alec Snyder ?MRN: [id number] ?DOB:1941/05/21, 81 y.o., male ?Today's Date: 04/15/2022 ? ? PT End of Session - 04/15/22 1511   ? ? Visit Number 4   ? Number of Visits 12   ? Date for PT Re-Evaluation 05/17/22   ? Authorization Type medicare   ? Progress Note Due on Visit 10   ? PT Start Time 7672   ? PT Stop Time 0947   ? PT Time Calculation (min) 41 min   ? Activity Tolerance Patient limited by pain   ? Behavior During Therapy [facility] for tasks assessed/performed   ? ?  ?  ? ?  ? ? ? ? ?Past Medical History:  ?Diagnosis Date  ? Arthritis   ? in hands  ? Cancer [facility])   ? melanoma/ on hand and leg  ? Hemorrhoids   ? occasional  ? Hx of adenomatous colonic polyps 06/30/2017  ? Hyperlipemia   ? Hypothyroid   ? Inguinal hernia   ? bilateral  ? Joint pain   ? minor  ? Transverse myelitis ([place])   ? Wears glasses   ? ?Past Surgical History:  ?Procedure Laterality Date  ? BLEPHAROPLASTY Bilateral 2016  ? COLONOSCOPY    ? HERNIA REPAIR  2012  ? BIH  ? MELANOMA EXCISION    ? ?Patient Active Problem List  ? Diagnosis Date Noted  ? Aortic valve disorder 03/30/2022  ? Hardening of the aorta (main artery of the heart) ([place]) 03/30/2022  ? Elevated PSA 03/30/2022  ? Localized, primary osteoarthritis of hand 03/30/2022  ? Transverse myelitis ([place]) 03/30/2022  ? Rheumatoid arthritis ([place]) 03/30/2022  ? Other specified health status 03/30/2022  ? Prediabetes 03/30/2022  ? Vitamin B12 deficiency (non anemic) 03/30/2022  ? Exertional dyspnea 04/10/2020  ? Essential hypertension 06/23/2019  ? 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 Hx of adenomatous colonic polyps 06/30/2017  ? Hypothyroidism 06/06/2017  ? Hyperlipidemia 05/14/2014  ? Inguinal hernia bilateral, non-recurrent 06/14/2011  ? ? ?PCP: Deland Pretty, MD ? ?REFERRING PROVIDER: Lynne Leader  ? ?REFERRING DIAG: M54.41,G89.29 (ICD-10-CM) - Chronic right-sided low back pain with right-sided sciatica ?M25.551 (ICD-10-CM) -  Right hip pain ? ?THERAPY DIAG:  ?Other low back pain ? ?Muscle weakness (generalized) ? ?Pain in right hip ? ?ONSET DATE: about one month for back pain ? ?SUBJECTIVE:                                                                                                                                                                                          ? ?SUBJECTIVE STATEMENT: ?Pt states slight improvement of  pain. ? ?EvaL: States that he has been having some symptoms in his leg and buttocks. States he saw the MD and he thinks it is a pinched nerve. States that he has felt a little bit better as he has taken the medication. States that the morning is the worse and as he walks it feels better. States that some positions. States that he was having back pain for about a month but it wasn't terrible and then on 03/29/22 he has severe pain in his leg.  States he wants to get back to golf. He notes he has been sitting more than usual ? ?PERTINENT HISTORY:  ?RA and  transverse myelytis, hx of low back pain ? ?PAIN:  ?Are you having pain? Yes: NPRS scale: at worse 8/10 ?Pain location: right buttocks ?Pain description: sharp  ?Aggravating factors: sitting down, getting up ?Relieving factors: medication  ? ? ?PRECAUTIONS: None ? ?WEIGHT BEARING RESTRICTIONS No ? ?FALLS:  ?Has patient fallen in last 6 months? No ? ? ?OCCUPATION: investor - part time worker ? ?PLOF: Independent ? ?PATIENT GOALS ikes to play golf but currently unable to ? ? ?OBJECTIVE:  ? ?DIAGNOSTIC FINDINGS:  ?Xray 03/31/22 ?IMPRESSION: ?No recent fracture is seen in the lumbar spine. Lumbar spondylosis ?with disc space narrowing, bony spurs and facet hypertrophy. There ?is interval worsening of degenerative changes in the lumbar spine, ?particularly at L4-L5 level. ? ? ?SCREENING FOR RED FLAGS: ?Bowel or bladder incontinence: No ?Spinal tumors: No ?Cauda equina syndrome: No ?Compression fracture: No ?Abdominal aneurysm: No ? ?COGNITION: ? Overall cognitive  status: Within functional limits for tasks assessed   ?  ?SENSATION: ?WFL ? ? ?POSTURE:  ?Sacral sitting, forward head, rounded shoulders. ? ?PALPATION: ?Tenderness to palpation along lumbar paraspinals  ? ?LUMBAR ROM:  ? ?Active  A/PROM  ?04/05/2022  ?Flexion 75% limited pain down leg  ?Extension 100% limited less pain  ?Right lateral flexion 75% limited pain down leg  ?Left lateral flexion 75% limited pain down leg  ?Right rotation   ?Left rotation   ? (Blank rows = not tested) ? ? ? ? LE Measurements ?Lower Extremity Right ?04/05/2022 Left ?04/05/2022  ? A/PROM MMT A/PROM MMT  ?Hip Flexion  4-  4  ?Hip Extension      ?Hip Abduction      ?Hip Adduction      ?Hip Internal rotation      ?Hip External rotation      ?Knee Flexion  4-  4  ?Knee Extension  4-  4  ?Ankle Dorsiflexion  4  4+  ?Ankle Plantarflexion      ?Ankle Inversion      ?Ankle Eversion      ? (Blank rows = not tested) ? * pain ? ? ?LUMBAR SPECIAL TESTS:  ?Slump test: Positive on right ?Unable to lay prone to test repeated extension, very tight in hip flexors ? ?FUNCTIONAL TESTS:  ?STS - difficult requires use of UE ? ? ? ?TODAY'S TREATMENT  ?04/15/2022 ?Therapeutic Exercise: ? Aerobic: ?  Supine: Mod fig 4 stretch 30 sec x 2 on R ;  Trial for pelvic tilts, better movement/ability in sitting; Trial for supine march, painful,  TA contraction 3 sec x 10, with education on achieving active contraction (increased radicular pain with bracing).  ?  Prone:  ?Seated: Pelvic tilts x 15; sit to stand x 5 - with education on back mechanics ?Standing: education and practice for bend/squat x 5, pt with question about how to lift laundry  basket  ? ?Neuromuscular Re-education:  ?Manual Therapy: Prone over pillow: Lumbar PA mobs,  Long leg distraction on R for lumbar traction;  ?Self Care:   ? ? ?PATIENT EDUCATION:  ?Education details: updated HEP,  ?Person educated: Patient ?Education method: Explanation, Demonstration, and Handouts ?Education comprehension: verbalized  understanding ? ?HOME EXERCISE PROGRAM: ?[place] ? ?ASSESSMENT: ? ?CLINICAL IMPRESSION: ?04/15/22: Pt continues to have quite a bit of pain and difficulty, but does have much improvement from last week. He has much improved ability for flexion, hip flexion to 110 deg today without pain, and improved ability for hip motion/ER without pain in supine. Trial for core strength and TA contraction today increases radicular pain. Pt is making small improvements, plan to progress as tolerated.  ? ?Eval: Patient is a 81 y.o. male who was seen today for physical therapy evaluation and treatment for lumbar pain radiating into right leg. Most limitations occur first thing in the morning, and transitional movements after sitting for long period of time. Educated patient on posture and effect prolonged postures has on lumbar spine. Patient would greatly benefit from skilled PT to improve overall function and QOL. ? ? ?OBJECTIVE IMPAIRMENTS decreased activity tolerance, decreased ROM, decreased strength, impaired perceived functional ability, impaired flexibility, improper body mechanics, postural dysfunction, and pain.  ? ?ACTIVITY LIMITATIONS community activity, occupation, yard work, and Office manager .  ? ?PERSONAL FACTORS Age, Fitness, and 1 comorbidity: transverse myelytis  are also affecting patient's functional outcome.  ? ? ?REHAB POTENTIAL: Good ? ?CLINICAL DECISION MAKING: Stable/uncomplicated ? ?EVALUATION COMPLEXITY: Low ? ? ?GOALS: ?Goals reviewed with patient?  yes ? ?SHORT TERM GOALS: ? ?Patient will be independent in self management strategies to improve quality of life and functional outcomes. ?Baseline: new program ?Target date: 05/06/2022 ?Goal status: INITIAL ? ?2.  Patient will report at least 50% improvement in overall symptoms and/or function to demonstrate improved functional mobility ?Baseline: 0% ?Target date: 05/06/2022 ?Goal status: INITIAL ? ?3.  Patient will be able to get up out of chair without use of arms or  reported stiffness afterwards ?Baseline: unable ?Target date: 05/06/2022 ?Goal status: INITIAL ? ? ? ? ?LONG TERM GOALS: ? ?Patient will report at least 75% improvement in overall symptoms and/or function to demon

## 2022-04-19 ENCOUNTER — Ambulatory Visit (INDEPENDENT_AMBULATORY_CARE_PROVIDER_SITE_OTHER): Payer: Medicare Other | Admitting: Physical Therapy

## 2022-04-19 DIAGNOSIS — M6281 Muscle weakness (generalized): Secondary | ICD-10-CM

## 2022-04-19 DIAGNOSIS — M5459 Other low back pain: Secondary | ICD-10-CM

## 2022-04-19 DIAGNOSIS — M25551 Pain in right hip: Secondary | ICD-10-CM

## 2022-04-19 NOTE — Therapy (Signed)
?OUTPATIENT PHYSICAL THERAPY TREATMENT ? ? ?Patient Name: Alec Snyder ?MRN: [id number] ?DOB:Sep 13, 1941, 81 y.o., male ?Today's Date: 04/19/2022 ? ? PT End of Session - 04/19/22 1203   ? ? Visit Number 5   ? Number of Visits 12   ? Date for PT Re-Evaluation 05/17/22   ? Authorization Type medicare   ? Progress Note Due on Visit 10   ? PT Start Time 1101   ? PT Stop Time 1145   ? PT Time Calculation (min) 44 min   ? Activity Tolerance Patient limited by pain   ? Behavior During Therapy [facility] for tasks assessed/performed   ? ?  ?  ? ?  ? ? ? ? ? ?Past Medical History:  ?Diagnosis Date  ? Arthritis   ? in hands  ? Cancer [facility])   ? melanoma/ on hand and leg  ? Hemorrhoids   ? occasional  ? Hx of adenomatous colonic polyps 06/30/2017  ? Hyperlipemia   ? Hypothyroid   ? Inguinal hernia   ? bilateral  ? Joint pain   ? minor  ? Transverse myelitis ([place])   ? Wears glasses   ? ?Past Surgical History:  ?Procedure Laterality Date  ? BLEPHAROPLASTY Bilateral 2016  ? COLONOSCOPY    ? HERNIA REPAIR  2012  ? BIH  ? MELANOMA EXCISION    ? ?Patient Active Problem List  ? Diagnosis Date Noted  ? Aortic valve disorder 03/30/2022  ? Hardening of the aorta (main artery of the heart) ([place]) 03/30/2022  ? Elevated PSA 03/30/2022  ? Localized, primary osteoarthritis of hand 03/30/2022  ? Transverse myelitis ([place]) 03/30/2022  ? Rheumatoid arthritis ([place]) 03/30/2022  ? Other specified health status 03/30/2022  ? Prediabetes 03/30/2022  ? Vitamin B12 deficiency (non anemic) 03/30/2022  ? Exertional dyspnea 04/10/2020  ? Essential hypertension 06/23/2019  ? 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 Hx of adenomatous colonic polyps 06/30/2017  ? Hypothyroidism 06/06/2017  ? Hyperlipidemia 05/14/2014  ? Inguinal hernia bilateral, non-recurrent 06/14/2011  ? ? ?PCP: Deland Pretty, MD ? ?REFERRING PROVIDER: Lynne Leader  ? ?REFERRING DIAG: M54.41,G89.29 (ICD-10-CM) - Chronic right-sided low back pain with right-sided sciatica ?M25.551 (ICD-10-CM) -  Right hip pain ? ?THERAPY DIAG:  ?Other low back pain ? ?Muscle weakness (generalized) ? ?Pain in right hip ? ?ONSET DATE: about one month for back pain ? ?SUBJECTIVE:                                                                                                                                                                                          ? ?SUBJECTIVE STATEMENT: ?States he can't get  comfortable at night and he has about 8/10 in right leg and right hip. States he woke up with pain last night.  ? ?EvaL: States that he has been having some symptoms in his leg and buttocks. States he saw the MD and he thinks it is a pinched nerve. States that he has felt a little bit better as he has taken the medication. States that the morning is the worse and as he walks it feels better. States that some positions. States that he was having back pain for about a month but it wasn't terrible and then on 03/29/22 he has severe pain in his leg.  States he wants to get back to golf. He notes he has been sitting more than usual ? ?PERTINENT HISTORY:  ?RA and  transverse myelytis, hx of low back pain ? ?PAIN:  ?Are you having pain? Yes: NPRS scale: at worse 8/10 ?Pain location: right buttocks ?Pain description: sharp  ?Aggravating factors: sitting down, getting up ?Relieving factors: medication  ? ? ?PRECAUTIONS: None ? ?WEIGHT BEARING RESTRICTIONS No ? ?FALLS:  ?Has patient fallen in last 6 months? No ? ? ?OCCUPATION: investor - part time worker ? ?PLOF: Independent ? ?PATIENT GOALS ikes to play golf but currently unable to ? ? ?OBJECTIVE:  ? ?DIAGNOSTIC FINDINGS:  ?Xray 03/31/22 ?IMPRESSION: ?No recent fracture is seen in the lumbar spine. Lumbar spondylosis ?with disc space narrowing, bony spurs and facet hypertrophy. There ?is interval worsening of degenerative changes in the lumbar spine, ?particularly at L4-L5 level. ? ? ?SCREENING FOR RED FLAGS: ?Bowel or bladder incontinence: No ?Spinal tumors: No ?Cauda equina syndrome:  No ?Compression fracture: No ?Abdominal aneurysm: No ? ?COGNITION: ? Overall cognitive status: Within functional limits for tasks assessed   ?  ?SENSATION: ?WFL ? ? ?POSTURE:  ?Sacral sitting, forward head, rounded shoulders. ? ?PALPATION: ?Tenderness to palpation along lumbar paraspinals  ? ?LUMBAR ROM:  ? ?Active  A/PROM  ?04/05/2022  ?Flexion 75% limited pain down leg  ?Extension 100% limited less pain  ?Right lateral flexion 75% limited pain down leg  ?Left lateral flexion 75% limited pain down leg  ?Right rotation   ?Left rotation   ? (Blank rows = not tested) ? ? ? ? LE Measurements ?Lower Extremity Right ?04/05/2022 Left ?04/05/2022  ? A/PROM MMT A/PROM MMT  ?Hip Flexion  4-  4  ?Hip Extension      ?Hip Abduction      ?Hip Adduction      ?Hip Internal rotation      ?Hip External rotation      ?Knee Flexion  4-  4  ?Knee Extension  4-  4  ?Ankle Dorsiflexion  4  4+  ?Ankle Plantarflexion      ?Ankle Inversion      ?Ankle Eversion      ? (Blank rows = not tested) ? * pain ? ? ?LUMBAR SPECIAL TESTS:  ?Slump test: Positive on right ?Unable to lay prone to test repeated extension, very tight in hip flexors ? ?FUNCTIONAL TESTS:  ?STS - difficult requires use of UE ? ? ? ?TODAY'S TREATMENT  ?04/19/2022 ?Therapeutic Exercise: ? Aerobic: ?  Supine: legs elevated on ball 90/90 - hamstring iso 5 minutes, PROM lumbar flex and right rotation (ball left)10 minutes - increased ROM, hip ER iso into hands 5 minutes, LTR without ball x15  ? ?Neuromuscular Re-education:  ?Manual Therapy: traction on ball to right leg and lumbar spine - 8 minutes, gentle PSOAS release R with contract  release - tolerated well - 5 minutes ? ?Previous interventions ?Mod fig 4 stretch 30 sec x 2 on R ;  Trial for pelvic tilts, better movement/ability in sitting; Trial for supine march, painful,  TA contraction 3 sec x 10, with education on achieving active contraction (increased radicular pain with bracing).  ?  Prone:  ?Seated: Pelvic tilts x 15; sit to  stand x 5 - with education on back mechanics ?Standing: education and practice for bend/squat x 5, pt with question about how to lift laundry basket  ? ?Prone over pillow: Lumbar PA mobs,  Long leg distraction on R for lumbar traction;  ?Self Care:   ? ? ?PATIENT EDUCATION:  ?Education details: updated HEP, typical presentation, positional relief and gentle motions ?Person educated: Patient ?Education method: Explanation, Demonstration, and Handouts ?Education comprehension: verbalized understanding ? ?HOME EXERCISE PROGRAM: ?[place] ? ?ASSESSMENT: ? ?CLINICAL IMPRESSION: ?04/19/2022 ?Patient continued to tolerate right unloading exercises/positions. Able to add isometrics in 90/90 position with good tolerance. Psoas release also tolerated well on right with gentle pressure and muscle activation. Improved right hip motion with glute activation. Reduced pain during 90/90 position with right lumbar rotation to zero but pain returned with loading. Overall reduced to 6/10 end of session ? ?Eval: Patient is a 81 y.o. male who was seen today for physical therapy evaluation and treatment for lumbar pain radiating into right leg. Most limitations occur first thing in the morning, and transitional movements after sitting for long period of time. Educated patient on posture and effect prolonged postures has on lumbar spine. Patient would greatly benefit from skilled PT to improve overall function and QOL. ? ? ?OBJECTIVE IMPAIRMENTS decreased activity tolerance, decreased ROM, decreased strength, impaired perceived functional ability, impaired flexibility, improper body mechanics, postural dysfunction, and pain.  ? ?ACTIVITY LIMITATIONS community activity, occupation, yard work, and Office manager .  ? ?PERSONAL FACTORS Age, Fitness, and 1 comorbidity: transverse myelytis  are also affecting patient's functional outcome.  ? ? ?REHAB POTENTIAL: Good ? ?CLINICAL DECISION MAKING: Stable/uncomplicated ? ?EVALUATION COMPLEXITY:  Low ? ? ?GOALS: ?Goals reviewed with patient?  yes ? ?SHORT TERM GOALS: ? ?Patient will be independent in self management strategies to improve quality of life and functional outcomes. ?Baseline: new program ?[place]

## 2022-04-21 ENCOUNTER — Encounter: Payer: Medicare Other | Admitting: Physical Therapy

## 2022-04-22 ENCOUNTER — Encounter: Payer: Self-pay | Admitting: Physical Therapy

## 2022-04-22 ENCOUNTER — Ambulatory Visit (INDEPENDENT_AMBULATORY_CARE_PROVIDER_SITE_OTHER): Payer: Medicare Other | Admitting: Physical Therapy

## 2022-04-22 DIAGNOSIS — M6281 Muscle weakness (generalized): Secondary | ICD-10-CM

## 2022-04-22 DIAGNOSIS — M5459 Other low back pain: Secondary | ICD-10-CM

## 2022-04-22 DIAGNOSIS — M25551 Pain in right hip: Secondary | ICD-10-CM | POA: Diagnosis not present

## 2022-04-22 NOTE — Therapy (Signed)
?OUTPATIENT PHYSICAL THERAPY TREATMENT ? ? ?Patient Name: Alec Snyder ?MRN: [id number] ?DOB:[birth year], 81 y.o., male ?Today's Date: 04/22/2022 ? ? PT End of Session - 04/22/22 1104   ? ? Visit Number 6   ? Number of Visits 12   ? Date for PT Re-Evaluation 05/17/22   ? Authorization Type medicare   ? Progress Note Due on Visit 10   ? PT Start Time 1105   ? PT Stop Time 0932   ? PT Time Calculation (min) 44 min   ? Activity Tolerance Patient limited by pain   ? Behavior During Therapy [facility] for tasks assessed/performed   ? ?  ?  ? ?  ? ? ? ? ? ?Past Medical History:  ?Diagnosis Date  ? Arthritis   ? in hands  ? Cancer [facility])   ? melanoma/ on hand and leg  ? Hemorrhoids   ? occasional  ? Hx of adenomatous colonic polyps 06/30/2017  ? Hyperlipemia   ? Hypothyroid   ? Inguinal hernia   ? bilateral  ? Joint pain   ? minor  ? Transverse myelitis ([place])   ? Wears glasses   ? ?Past Surgical History:  ?Procedure Laterality Date  ? BLEPHAROPLASTY Bilateral 2016  ? COLONOSCOPY    ? HERNIA REPAIR  2012  ? BIH  ? MELANOMA EXCISION    ? ?Patient Active Problem List  ? Diagnosis Date Noted  ? Aortic valve disorder 03/30/2022  ? Hardening of the aorta (main artery of the heart) ([place]) 03/30/2022  ? Elevated PSA 03/30/2022  ? Localized, primary osteoarthritis of hand 03/30/2022  ? Transverse myelitis ([place]) 03/30/2022  ? Rheumatoid arthritis ([place]) 03/30/2022  ? Other specified health status 03/30/2022  ? Prediabetes 03/30/2022  ? Vitamin B12 deficiency (non anemic) 03/30/2022  ? Exertional dyspnea 04/10/2020  ? Essential hypertension 06/23/2019  ? 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 Hx of adenomatous colonic polyps 06/30/2017  ? Hypothyroidism 06/06/2017  ? Hyperlipidemia 05/14/2014  ? Inguinal hernia bilateral, non-recurrent 06/14/2011  ? ? ?PCP: Deland Pretty, MD ? ?REFERRING PROVIDER: Lynne Leader  ? ?REFERRING DIAG: M54.41,G89.29 (ICD-10-CM) - Chronic right-sided low back pain with right-sided sciatica ?M25.551 (ICD-10-CM) -  Right hip pain ? ?THERAPY DIAG:  ?Other low back pain ? ?Muscle weakness (generalized) ? ?Pain in right hip ? ?ONSET DATE: about one month for back pain ? ?SUBJECTIVE:                                                                                                                                                                                          ? ?SUBJECTIVE STATEMENT: ?States that he feels  like he is getting a little better and has not been waking up as much due to pain. States that he was able to rotate to his left side yesterday without pain. States that he has tried the tennis ball exercise and it feels better. ? ?EvaL: States that he has been having some symptoms in his leg and buttocks. States he saw the MD and he thinks it is a pinched nerve. States that he has felt a little bit better as he has taken the medication. States that the morning is the worse and as he walks it feels better. States that some positions. States that he was having back pain for about a month but it wasn't terrible and then on 03/29/22 he has severe pain in his leg.  States he wants to get back to golf. He notes he has been sitting more than usual ? ?PERTINENT HISTORY:  ?RA and  transverse myelytis, hx of low back pain ? ?PAIN:  ?Are you having pain? Yes: NPRS scale: at worse 5/10 ?Pain location: right buttocks and front thigh ?Pain description: sharp  ?Aggravating factors: sitting down, getting up ?Relieving factors: medication  ? ? ?PRECAUTIONS: None ? ?WEIGHT BEARING RESTRICTIONS No ? ?FALLS:  ?Has patient fallen in last 6 months? No ? ? ?OCCUPATION: investor - part time worker ? ?PLOF: Independent ? ?PATIENT GOALS ikes to play golf but currently unable to ? ? ?OBJECTIVE:  ? ?DIAGNOSTIC FINDINGS:  ?Xray 03/31/22 ?IMPRESSION: ?No recent fracture is seen in the lumbar spine. Lumbar spondylosis ?with disc space narrowing, bony spurs and facet hypertrophy. There ?is interval worsening of degenerative changes in the lumbar  spine, ?particularly at L4-L5 level. ? ? ?SCREENING FOR RED FLAGS: ?Bowel or bladder incontinence: No ?Spinal tumors: No ?Cauda equina syndrome: No ?Compression fracture: No ?Abdominal aneurysm: No ? ?COGNITION: ? Overall cognitive status: Within functional limits for tasks assessed   ?  ?SENSATION: ?WFL ? ? ?POSTURE:  ?Sacral sitting, forward head, rounded shoulders. ? ?PALPATION: ?Tenderness to palpation along lumbar paraspinals  ? ?LUMBAR ROM:  ? ?Active  A/PROM  ?04/05/2022  ?Flexion 75% limited pain down leg  ?Extension 100% limited less pain  ?Right lateral flexion 75% limited pain down leg  ?Left lateral flexion 75% limited pain down leg  ?Right rotation   ?Left rotation   ? (Blank rows = not tested) ? ? ? ? LE Measurements ?Lower Extremity Right ?04/05/2022 Left ?04/05/2022  ? A/PROM MMT A/PROM MMT  ?Hip Flexion  4-  4  ?Hip Extension      ?Hip Abduction      ?Hip Adduction      ?Hip Internal rotation      ?Hip External rotation      ?Knee Flexion  4-  4  ?Knee Extension  4-  4  ?Ankle Dorsiflexion  4  4+  ?Ankle Plantarflexion      ?Ankle Inversion      ?Ankle Eversion      ? (Blank rows = not tested) ? * pain ? ? ?LUMBAR SPECIAL TESTS:  ?Slump test: Positive on right ?Unable to lay prone to test repeated extension, very tight in hip flexors ? ?FUNCTIONAL TESTS:  ?STS - difficult requires use of UE ? ? ? ?TODAY'S TREATMENT  ?04/22/2022 ?Therapeutic Exercise: ? Standing: tennis ball to right QL at wall ?  Supine: right LTR 2 minute hold, LTR 2 minutes, hip IR ER 2 minutes each in hook lying  ? Prone lying over pillow and pillow under  feet 5 minutes  ? ?Neuromuscular Re-education:  ?Manual Therapy: prone STM to right WL/lumbar paraspinals, grade I/II mobilizations to L1-5- radicular symptoms with mobilizations - less noted afterwards, gentle lateral hip glide - not tolerated ? ?Previous interventions ?Mod fig 4 stretch 30 sec x 2 on R ;  Trial for pelvic tilts, better movement/ability in sitting; Trial for supine  march, painful,  TA contraction 3 sec x 10, with education on achieving active contraction (increased radicular pain with bracing).  ?  Prone:  ?Seated: Pelvic tilts x 15; sit to stand x 5 - with education on back mechanics ?Standing: education and practice for bend/squat x 5, pt with question about how to lift laundry basket  ?  ?Self Care:   ? ? ?PATIENT EDUCATION:  ?Education details: on prone lying, on mobilizations, on manipulations/mobilizations ?Person educated: Patient ?Education method: Explanation, Demonstration, and Handouts ?Education comprehension: verbalized understanding ? ?HOME EXERCISE PROGRAM: ?[place] ? ?ASSESSMENT: ? ?CLINICAL IMPRESSION: ?04/22/2022 ?Patient with good response to manual work on this date reducing pain to 4/10 end of session (no pain noted initially after just a heaviness in leg but then pain returned with standing). Patient inquired about manipulation, discussed difference between manipulation and mobilization and patients current presentation not tolerating aggressive interventions at this time. Encouraged prone lying with support at home as exercise. Will continue with current POC.  ? ?Eval: Patient is a 81 y.o. male who was seen today for physical therapy evaluation and treatment for lumbar pain radiating into right leg. Most limitations occur first thing in the morning, and transitional movements after sitting for long period of time. Educated patient on posture and effect prolonged postures has on lumbar spine. Patient would greatly benefit from skilled PT to improve overall function and QOL. ? ? ?OBJECTIVE IMPAIRMENTS decreased activity tolerance, decreased ROM, decreased strength, impaired perceived functional ability, impaired flexibility, improper body mechanics, postural dysfunction, and pain.  ? ?ACTIVITY LIMITATIONS community activity, occupation, yard work, and Office manager .  ? ?PERSONAL FACTORS Age, Fitness, and 1 comorbidity: transverse myelytis  are also affecting  patient's functional outcome.  ? ? ?REHAB POTENTIAL: Good ? ?CLINICAL DECISION MAKING: Stable/uncomplicated ? ?EVALUATION COMPLEXITY: Low ? ? ?GOALS: ?Goals reviewed with patient?  yes ? ?SHORT TERM GOALS: ? ?[place]

## 2022-04-26 ENCOUNTER — Ambulatory Visit (INDEPENDENT_AMBULATORY_CARE_PROVIDER_SITE_OTHER): Payer: Medicare Other | Admitting: Physical Therapy

## 2022-04-26 ENCOUNTER — Encounter: Payer: Self-pay | Admitting: Physical Therapy

## 2022-04-26 DIAGNOSIS — M25551 Pain in right hip: Secondary | ICD-10-CM

## 2022-04-26 DIAGNOSIS — M6281 Muscle weakness (generalized): Secondary | ICD-10-CM | POA: Diagnosis not present

## 2022-04-26 DIAGNOSIS — M5459 Other low back pain: Secondary | ICD-10-CM

## 2022-04-26 NOTE — Therapy (Signed)
?OUTPATIENT PHYSICAL THERAPY TREATMENT ? ? ?Patient Name: Alec Snyder ?MRN: [id number] ?DOB:Apr 27, 1941, 81 y.o., male ?Today's Date: 04/26/2022 ? ? PT End of Session - 04/26/22 1102   ? ? Visit Number 7   ? Number of Visits 12   ? Date for PT Re-Evaluation 05/17/22   ? Authorization Type medicare   ? Progress Note Due on Visit 10   ? PT Start Time 1104   ? PT Stop Time 1147   ? PT Time Calculation (min) 43 min   ? Activity Tolerance Patient limited by pain   ? Behavior During Therapy [facility] for tasks assessed/performed   ? ?  ?  ? ?  ? ? ? ? ? ?Past Medical History:  ?Diagnosis Date  ? Arthritis   ? in hands  ? Cancer [facility])   ? melanoma/ on hand and leg  ? Hemorrhoids   ? occasional  ? Hx of adenomatous colonic polyps 06/30/2017  ? Hyperlipemia   ? Hypothyroid   ? Inguinal hernia   ? bilateral  ? Joint pain   ? minor  ? Transverse myelitis ([place])   ? Wears glasses   ? ?Past Surgical History:  ?Procedure Laterality Date  ? BLEPHAROPLASTY Bilateral 2016  ? COLONOSCOPY    ? HERNIA REPAIR  2012  ? BIH  ? MELANOMA EXCISION    ? ?Patient Active Problem List  ? Diagnosis Date Noted  ? Aortic valve disorder 03/30/2022  ? Hardening of the aorta (main artery of the heart) ([place]) 03/30/2022  ? Elevated PSA 03/30/2022  ? Localized, primary osteoarthritis of hand 03/30/2022  ? Transverse myelitis ([place]) 03/30/2022  ? Rheumatoid arthritis ([place]) 03/30/2022  ? Other specified health status 03/30/2022  ? Prediabetes 03/30/2022  ? Vitamin B12 deficiency (non anemic) 03/30/2022  ? Exertional dyspnea 04/10/2020  ? Essential hypertension 06/23/2019  ? 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 Hx of adenomatous colonic polyps 06/30/2017  ? Hypothyroidism 06/06/2017  ? Hyperlipidemia 05/14/2014  ? Inguinal hernia bilateral, non-recurrent 06/14/2011  ? ? ?PCP: Deland Pretty, MD ? ?REFERRING PROVIDER: Lynne Leader  ? ?REFERRING DIAG: M54.41,G89.29 (ICD-10-CM) - Chronic right-sided low back pain with right-sided sciatica ?M25.551 (ICD-10-CM) -  Right hip pain ? ?THERAPY DIAG:  ?Other low back pain ? ?Muscle weakness (generalized) ? ?Pain in right hip ? ?ONSET DATE: about one month for back pain ? ?SUBJECTIVE:                                                                                                                                                                                          ? ?SUBJECTIVE STATEMENT: ?States that he is  still having a lot of pain in his right leg. States that rubbing his right leg is still bothering him. Bending over is still very painful. Sees the MD tomorrow. Overall reports 40% improvements since the start of PT ? ?EvaL: States that he has been having some symptoms in his leg and buttocks. States he saw the MD and he thinks it is a pinched nerve. States that he has felt a little bit better as he has taken the medication. States that the morning is the worse and as he walks it feels better. States that some positions. States that he was having back pain for about a month but it wasn't terrible and then on 03/29/22 he has severe pain in his leg.  States he wants to get back to golf. He notes he has been sitting more than usual ? ?PERTINENT HISTORY:  ?RA and  transverse myelytis, hx of low back pain ? ?PAIN:  ?Are you having pain? Yes: NPRS scale: at worse 5/10 ?Pain location: right buttocks and front thigh ?Pain description: sharp  ?Aggravating factors: sitting down, getting up ?Relieving factors: medication  ? ? ?PRECAUTIONS: None ? ?WEIGHT BEARING RESTRICTIONS No ? ?FALLS:  ?Has patient fallen in last 6 months? No ? ? ?OCCUPATION: investor - part time worker ? ?PLOF: Independent ? ?PATIENT GOALS ikes to play golf but currently unable to ? ? ?OBJECTIVE:  ? ?DIAGNOSTIC FINDINGS:  ?Xray 03/31/22 ?IMPRESSION: ?No recent fracture is seen in the lumbar spine. Lumbar spondylosis ?with disc space narrowing, bony spurs and facet hypertrophy. There ?is interval worsening of degenerative changes in the lumbar spine, ?particularly at  L4-L5 level. ? ? ?SCREENING FOR RED FLAGS: ?Bowel or bladder incontinence: No ?Spinal tumors: No ?Cauda equina syndrome: No ?Compression fracture: No ?Abdominal aneurysm: No ? ?COGNITION: ? Overall cognitive status: Within functional limits for tasks assessed   ?  ?SENSATION: ?WFL ? ? ?POSTURE:  ?Sacral sitting, forward head, rounded shoulders. ? ?PALPATION: ?Tenderness to palpation along lumbar paraspinals  ? ?LUMBAR ROM:  ? ?Active  A/PROM  ?04/05/2022  ?Flexion 75% limited pain down leg  ?Extension 100% limited less pain  ?Right lateral flexion 75% limited pain down leg  ?Left lateral flexion 75% limited pain down leg  ?Right rotation   ?Left rotation   ? (Blank rows = not tested) ? ? ? ? LE Measurements ?Lower Extremity Right ?04/05/2022 Left ?04/05/2022  ? A/PROM MMT A/PROM MMT  ?Hip Flexion  4-  4  ?Hip Extension      ?Hip Abduction      ?Hip Adduction      ?Hip Internal rotation      ?Hip External rotation      ?Knee Flexion  4-  4  ?Knee Extension  4-  4  ?Ankle Dorsiflexion  4  4+  ?Ankle Plantarflexion      ?Ankle Inversion      ?Ankle Eversion      ? (Blank rows = not tested) ? * pain ? ? ?LUMBAR SPECIAL TESTS:  ?Slump test: Positive on right ?Unable to lay prone to test repeated extension, very tight in hip flexors ? ?FUNCTIONAL TESTS:  ?STS - difficult requires use of UE ? ? ? ?TODAY'S TREATMENT  ?04/26/2022 ?Therapeutic Exercise: ? Standing: walking with upright posture/pushing self forward 8 minutes, lumbar extension at wall - not tolerated well  ? Prone lying - no pillow 5 minutes,  prone on elbows 5x10 total, PROM of bilateral hips in all directions ? ?Manual Therapy:PA to  B hip grade II/III - tolerated well. STM to bilateral glutes/piriformis. ? ?Previous interventions ?Mod fig 4 stretch 30 sec x 2 on R ;  Trial for pelvic tilts, better movement/ability in sitting; Trial for supine march, painful,  TA contraction 3 sec x 10, with education on achieving active contraction (increased radicular pain with  bracing).  ?  Prone:  ?Seated: Pelvic tilts x 15; sit to stand x 5 - with education on back mechanics ?Standing: education and practice for bend/squat x 5, pt with question about how to lift laundry basket  ?  ?Self Care:   ? ? ?PATIENT EDUCATION:  ?Education details: on prone lying, on current presentaiton, on active walking and posture ?Person educated: Patient ?Education method: Explanation, Demonstration, and Handouts ?Education comprehension: verbalized understanding ? ?HOME EXERCISE PROGRAM: ?Reader ? ?ASSESSMENT: ? ?CLINICAL IMPRESSION: ?04/26/2022 ?Overall patient tolerated session well and able to lay completely flat on table without pain. Progressed to press ups on elbows and this was also tolerated well. Improvement in painfree hip motion and reduced symptoms noted end of session. Instructed patient to walk instead of ride stationary bike secondary to poor sitting tolerance at this time. Will continue with current POC as tolerated.  ? ?Eval: Patient is a 81 y.o. male who was seen today for physical therapy evaluation and treatment for lumbar pain radiating into right leg. Most limitations occur first thing in the morning, and transitional movements after sitting for long period of time. Educated patient on posture and effect prolonged postures has on lumbar spine. Patient would greatly benefit from skilled PT to improve overall function and QOL. ? ? ?OBJECTIVE IMPAIRMENTS decreased activity tolerance, decreased ROM, decreased strength, impaired perceived functional ability, impaired flexibility, improper body mechanics, postural dysfunction, and pain.  ? ?ACTIVITY LIMITATIONS community activity, occupation, yard work, and Office manager .  ? ?PERSONAL FACTORS Age, Fitness, and 1 comorbidity: transverse myelytis  are also affecting patient's functional outcome.  ? ? ?REHAB POTENTIAL: Good ? ?CLINICAL DECISION MAKING: Stable/uncomplicated ? ?EVALUATION COMPLEXITY: Low ? ? ?GOALS: ?Goals reviewed with patient?   yes ? ?SHORT TERM GOALS: ? ?Patient will be independent in self management strategies to improve quality of life and functional outcomes. ?Baseline: new program ?Target date: 05/17/2022 ?Goal status: PROGRESSI

## 2022-04-27 ENCOUNTER — Ambulatory Visit: Payer: Self-pay

## 2022-04-27 ENCOUNTER — Ambulatory Visit: Payer: Medicare Other | Admitting: Family Medicine

## 2022-04-27 ENCOUNTER — Ambulatory Visit (INDEPENDENT_AMBULATORY_CARE_PROVIDER_SITE_OTHER): Payer: Medicare Other

## 2022-04-27 ENCOUNTER — Ambulatory Visit (INDEPENDENT_AMBULATORY_CARE_PROVIDER_SITE_OTHER): Payer: Medicare Other | Admitting: Family Medicine

## 2022-04-27 VITALS — BP 142/74 | HR 76 | Ht 68.0 in | Wt 160.0 lb

## 2022-04-27 DIAGNOSIS — G8929 Other chronic pain: Secondary | ICD-10-CM

## 2022-04-27 DIAGNOSIS — M25551 Pain in right hip: Secondary | ICD-10-CM | POA: Diagnosis not present

## 2022-04-27 DIAGNOSIS — M5441 Lumbago with sciatica, right side: Secondary | ICD-10-CM | POA: Diagnosis not present

## 2022-04-27 NOTE — Patient Instructions (Addendum)
Thank you for coming in today.  ? ?You received a steroid injection in your right hip today. Seek immediate medical attention if the joint becomes red, extremely painful, or is oozing fluid.  ? ?Let me know how you are doing. ? ?Check back in 1 month ? ? ?

## 2022-04-27 NOTE — Progress Notes (Signed)
? ?I, Peterson Lombard, LAT, ATC acting as a scribe for Lynne Leader, MD. ? ?Alec Snyder is a 81 y.o. male who presents to [facility] today for f/u R hip and low back pain w/ radiating pain into the R anterior thigh. Of note, pt has a hx of transverse myelitis affecting the thoracic spine and of rheumatoid arthritis. Pt was last seen by [name] on 03/30/22 and was prescribed prednisone, gabapentin, and was referred to PT, completing 7 visits. Today, pt reports some improvement, pain went from a 8-9 to 6-7 rating. Pt c/o pain being constant and really bothersome try to drive. Pt locates pain the R hip and anterior R thigh. ? ?Dx imaging: 03/30/22 L-spine XR ? 07/20/17 T-spine MRI ? 07/22/16 T-spine MRI ? 06/03/16 T-spine MRI ? 03/08/09 L-spine MRI ? ?Pertinent review of systems: No fevers or chills ? ?Relevant historical information: History of transverse myelitis ? ? ?Exam:  ?BP (!) 142/74   Pulse 76   Ht '5\' 8"'$  (1.727 m)   Wt 160 lb (72.6 kg)   SpO2 98%   BMI 24.33 kg/m?  ?General: Well Developed, well nourished, and in no acute distress.  ? ?MSK:  ?Right hip: Normal-appearing ?Nontender. ?Decreased range of motion pain with flexion and internal rotation. ? ? ? ?Lab and Radiology Results ? ?Procedure: Real-time Ultrasound Guided Injection of right hip femoral acetabular joint ?Device: Philips Affiniti 50G ?Images permanently stored and available for review in PACS ?Verbal informed consent obtained.  Discussed risks and benefits of procedure. Warned about infection, bleeding, hyperglycemia damage to structures among others. ?Patient expresses understanding and agreement ?Time-out conducted.   ?Noted no overlying erythema, induration, or other signs of local infection.   ?Skin prepped in a sterile fashion.   ?Local anesthesia: Topical Ethyl chloride.   ?With sterile technique and under real time ultrasound guidance: 40 mg of Kenalog and 2 mL of Marcaine injected into right hip joint  capsule. Fluid seen entering the hip joint.   ?Completed without difficulty   ?Pain immediately resolved suggesting accurate placement of the medication.   ?Advised to call if fevers/chills, erythema, induration, drainage, or persistent bleeding.   ?Images permanently stored and available for review in the ultrasound unit.  ?Impression: Technically successful ultrasound guided injection. ? ? ? ?X-ray images right hip obtained today personally and independently interpreted ?No acute fractures.  Mild right hip DJD. ?Await formal radiology review ? ? ?Assessment and Plan: ?81 y.o. male with right thigh pain.  Patient had great immediate response and articular hip injection indicating that a lot of his pain today is generated intra-articular right hip.  Neck step however will likely be MRI L-spine and likely right hip to further evaluate source of pain and to determine future treatment plan and options.  Patient will report back how he is feeling in the near future and will check back in 1 month. ? ? ?PDMP not reviewed this encounter. ?Orders Placed This Encounter  ?Procedures  ? [place] LIMITED JOINT SPACE STRUCTURES LOW RIGHT(NO LINKED CHARGES)  ?  Order Specific Question:   Reason for Exam (SYMPTOM  OR DIAGNOSIS REQUIRED)  ?  Answer:   right hip pain  ?  Order Specific Question:   Preferred imaging location?  ?  Answer:   [place]  ? DG HIP UNILAT WITH PELVIS 2-3 VIEWS RIGHT  ?  Standing Status:   Future  ?  Number of Occurrences:   1  ?  Standing Expiration Date:   04/28/2023  ?  Order Specific Question:   Reason for Exam (SYMPTOM  OR DIAGNOSIS REQUIRED)  ?  Answer:   eval rt hip pain  ?  Order Specific Question:   Preferred imaging location?  ?  Answer:   [facility]  ? ?No orders of the defined types were placed in this encounter. ? ? ? ?Discussed warning signs or symptoms. Please see discharge instructions. Patient expresses understanding. ? ? ?The above documentation has been  reviewed and is accurate and complete Lynne Leader, M.D. ? ? ?

## 2022-04-29 ENCOUNTER — Ambulatory Visit (INDEPENDENT_AMBULATORY_CARE_PROVIDER_SITE_OTHER): Payer: Medicare Other | Admitting: Physical Therapy

## 2022-04-29 ENCOUNTER — Encounter: Payer: Self-pay | Admitting: Physical Therapy

## 2022-04-29 DIAGNOSIS — M5459 Other low back pain: Secondary | ICD-10-CM

## 2022-04-29 DIAGNOSIS — M6281 Muscle weakness (generalized): Secondary | ICD-10-CM

## 2022-04-29 DIAGNOSIS — M25551 Pain in right hip: Secondary | ICD-10-CM

## 2022-04-29 NOTE — Progress Notes (Signed)
Right hip x-ray shows mild arthritis change present in both hips.

## 2022-04-29 NOTE — Therapy (Signed)
OUTPATIENT PHYSICAL THERAPY TREATMENT   Patient Name: Alec Snyder MRN: [id number] DOB:[birth year], 81 y.o., male Today's Date: 04/29/2022   PT End of Session - 04/29/22 1101     Visit Number 8    Number of Visits 12    Date for PT Re-Evaluation 05/17/22    Authorization Type medicare    Progress Note Due on Visit 10    PT Start Time 1103    PT Stop Time 1145    PT Time Calculation (min) 42 min    Activity Tolerance Patient limited by pain    Behavior During Therapy [facility] for tasks assessed/performed                Past Medical History:  Diagnosis Date   Arthritis    in hands   Cancer ([place])    melanoma/ on hand and leg   Hemorrhoids    occasional   Hx of adenomatous colonic polyps 06/30/2017   Hyperlipemia    Hypothyroid    Inguinal hernia    bilateral   Joint pain    minor   Transverse myelitis ([place])    Wears glasses    Past Surgical History:  Procedure Laterality Date   BLEPHAROPLASTY Bilateral 2016   COLONOSCOPY     HERNIA REPAIR  2012   BIH   MELANOMA EXCISION     Patient Active Problem List   Diagnosis Date Noted   Aortic valve disorder 03/30/2022   Hardening of the aorta (main artery of the heart) ([place]) 03/30/2022   Elevated PSA 03/30/2022   Localized, primary osteoarthritis of hand 03/30/2022   Transverse myelitis ([place]) 03/30/2022   Rheumatoid arthritis ([place]) 03/30/2022   Other specified health status 03/30/2022   Prediabetes 03/30/2022   Vitamin B12 deficiency (non anemic) 03/30/2022   Exertional dyspnea 04/10/2020   Essential hypertension 06/23/2019   Positive QuantiFERON-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 07/12/2017   Hx of adenomatous colonic polyps 06/30/2017   Hypothyroidism 06/06/2017   Hyperlipidemia 05/14/2014   Inguinal hernia bilateral, non-recurrent 06/14/2011    PCP: Deland Pretty, MD  REFERRING PROVIDER: Lynne Leader   REFERRING DIAG: [phone] (ICD-10-CM) - Chronic right-sided low back pain with right-sided sciatica M25.551 (ICD-10-CM) -  Right hip pain  THERAPY DIAG:  Other low back pain  Muscle weakness (generalized)  Pain in right hip  ONSET DATE: about one month for back pain  SUBJECTIVE:                                                                                                                                                                                           SUBJECTIVE STATEMENT: Had an injection in  his hip and states he is a little better but still having about 5/10 pain along the back of the hip and feels like his right leg is heavier. States that he tried to lay on his stomach but it was painful.   EvaL: States that he has been having some symptoms in his leg and buttocks. States he saw the MD and he thinks it is a pinched nerve. States that he has felt a little bit better as he has taken the medication. States that the morning is the worse and as he walks it feels better. States that some positions. States that he was having back pain for about a month but it wasn't terrible and then on 03/29/22 he has severe pain in his leg.  States he wants to get back to golf. He notes he has been sitting more than usual  PERTINENT HISTORY:  RA and  transverse myelytis, hx of low back pain  PAIN:  Are you having pain? Yes: NPRS scale: at worse 5/10 Pain location: right buttocks and front thigh Pain description: sharp  Aggravating factors: sitting down, getting up Relieving factors: medication    PRECAUTIONS: None  WEIGHT BEARING RESTRICTIONS No  FALLS:  Has patient fallen in last 6 months? No   OCCUPATION: investor - part time worker  PLOF: Independent  PATIENT GOALS ikes to play golf but currently unable to   OBJECTIVE:   DIAGNOSTIC FINDINGS:  Xray 03/31/22 IMPRESSION: No recent fracture is seen in the lumbar spine. Lumbar spondylosis with disc space narrowing, bony spurs and facet hypertrophy. There is interval worsening of degenerative changes in the lumbar spine, particularly at L4-L5  level.   SCREENING FOR RED FLAGS: Bowel or bladder incontinence: No Spinal tumors: No Cauda equina syndrome: No Compression fracture: No Abdominal aneurysm: No  COGNITION:  Overall cognitive status: Within functional limits for tasks assessed     SENSATION: WFL   POSTURE:  Sacral sitting, forward head, rounded shoulders.  PALPATION: Tenderness to palpation along lumbar paraspinals   LUMBAR ROM:   Active  A/PROM  04/05/2022  Flexion 75% limited pain down leg  Extension 100% limited less pain  Right lateral flexion 75% limited pain down leg  Left lateral flexion 75% limited pain down leg  Right rotation   Left rotation    (Blank rows = not tested)     LE Measurements Lower Extremity Right 04/05/2022 Left 04/05/2022   A/PROM MMT A/PROM MMT  Hip Flexion  4-  4  Hip Extension      Hip Abduction      Hip Adduction      Hip Internal rotation      Hip External rotation      Knee Flexion  4-  4  Knee Extension  4-  4  Ankle Dorsiflexion  4  4+  Ankle Plantarflexion      Ankle Inversion      Ankle Eversion       (Blank rows = not tested)  * pain   LUMBAR SPECIAL TESTS:  Slump test: Positive on right Unable to lay prone to test repeated extension, very tight in hip flexors  FUNCTIONAL TESTS:  STS - difficult requires use of UE    TODAY'S TREATMENT  04/29/2022 Neuro: Seated posture in chair and in car- use of pillow to elevate the dump in the car seat - use of towel as needed trailed under legs and behind back - 18 minutes  Manual Therapy:PA to B hip grade II/III,  PA to lumbar spine grade I/II -L1-3 reproduced symptoms  Previous interventions Mod fig 4 stretch 30 sec x 2 on R ;  Trial for pelvic tilts, better movement/ability in sitting; Trial for supine march, painful,  TA contraction 3 sec x 10, with education on achieving active contraction (increased radicular pain with bracing).    Prone:  Seated: Pelvic tilts x 15; sit to stand x 5 - with education on  back mechanics Standing: education and practice for bend/squat x 5, pt with question about how to lift laundry basket    Self Care:     PATIENT EDUCATION:  Education details: on posture with seated, on positioning, on HEP Person educated: Patient Education method: Explanation, Demonstration, and Handouts Education comprehension: verbalized understanding  HOME EXERCISE PROGRAM: MQDAGATE  ASSESSMENT:  CLINICAL IMPRESSION: 04/29/2022  Symptoms reproduced with light PA of L1-3 but reduced with repetition and improved tolerance noted afterwards. Educated patient in supine over small towel and on sitting posture in and out of car and how to support himself. Reduced symptoms noted end of session will continue with current POC as tolerated.   Eval: Patient is a 81 y.o. male who was seen today for physical therapy evaluation and treatment for lumbar pain radiating into right leg. Most limitations occur first thing in the morning, and transitional movements after sitting for long period of time. Educated patient on posture and effect prolonged postures has on lumbar spine. Patient would greatly benefit from skilled PT to improve overall function and QOL.   OBJECTIVE IMPAIRMENTS decreased activity tolerance, decreased ROM, decreased strength, impaired perceived functional ability, impaired flexibility, improper body mechanics, postural dysfunction, and pain.   ACTIVITY LIMITATIONS community activity, occupation, yard work, and Office manager .   PERSONAL FACTORS Age, Fitness, and 1 comorbidity: transverse myelytis  are also affecting patient's functional outcome.    REHAB POTENTIAL: Good  CLINICAL DECISION MAKING: Stable/uncomplicated  EVALUATION COMPLEXITY: Low   GOALS: Goals reviewed with patient?  yes  SHORT TERM GOALS:  Patient will be independent in self management strategies to improve quality of life and functional outcomes. Baseline: new program Target date: 05/20/2022 Goal status:  PROGRESSING  2.  Patient will report at least 50% improvement in overall symptoms and/or function to demonstrate improved functional mobility Baseline: 0% Target date: 05/20/2022 Goal status: PROGRESSING  3.  Patient will be able to get up out of chair without use of arms or reported stiffness afterwards Baseline: unable Target date: 05/20/2022 Goal status: PROGRESSING     LONG TERM GOALS:  Patient will report at least 75% improvement in overall symptoms and/or function to demonstrate improved functional mobility Baseline: 0% Target date: 06/10/2022 Goal status: PROGRESSING  2.  Patient will be able to demonstrate lumbar ROM without radicular symptoms. Baseline: painful Target date: 06/10/2022 Goal status: PROGRESSING  3.  Patient will report returning to golf without difficulties secondary to pain in leg. Baseline: not playing golf Target date: 06/10/2022 Goal status: PROGRESSING     PLAN: PT FREQUENCY: 2x/week  PT DURATION: 6 weeks  PLANNED INTERVENTIONS: Therapeutic exercises, Therapeutic activity, Neuromuscular re-education, Balance training, Gait training, Patient/Family education, Joint mobilization, Dry Needling, Electrical stimulation, Spinal mobilization, Cryotherapy, Moist heat, Traction, Ionotophoresis '4mg'$ /ml Dexamethasone, and Manual therapy.  PLAN FOR NEXT SESSION: L1-3 PA light, laying over towel supine - f/uright lumbar rotation, traction, isometrics, ROM, prone mobilizations  12:55 PM, 04/29/22 Jerene Pitch, DPT Physical Therapy with [facility]

## 2022-05-03 ENCOUNTER — Ambulatory Visit (INDEPENDENT_AMBULATORY_CARE_PROVIDER_SITE_OTHER): Payer: Medicare Other | Admitting: Physical Therapy

## 2022-05-03 ENCOUNTER — Encounter: Payer: Self-pay | Admitting: Physical Therapy

## 2022-05-03 ENCOUNTER — Telehealth: Payer: Self-pay | Admitting: *Deleted

## 2022-05-03 DIAGNOSIS — M5459 Other low back pain: Secondary | ICD-10-CM

## 2022-05-03 DIAGNOSIS — M25551 Pain in right hip: Secondary | ICD-10-CM

## 2022-05-03 DIAGNOSIS — G8929 Other chronic pain: Secondary | ICD-10-CM

## 2022-05-03 DIAGNOSIS — M6281 Muscle weakness (generalized): Secondary | ICD-10-CM

## 2022-05-03 NOTE — Telephone Encounter (Signed)
Pt called stating [name] wanted him to call & let him know how his R hip, low back is doing after the epidural & the pt states that he did not have any relief.

## 2022-05-03 NOTE — Therapy (Signed)
OUTPATIENT PHYSICAL THERAPY TREATMENT   Patient Name: Alec Snyder MRN: [id number] DOB:1941/01/13, 81 y.o., male Today's Date: 05/03/2022   PT End of Session - 05/03/22 1101     Visit Number 9    Number of Visits 12    Date for PT Re-Evaluation 05/17/22    Authorization Type medicare    Progress Note Due on Visit 10    PT Start Time 1102    PT Stop Time 1141    PT Time Calculation (min) 39 min    Activity Tolerance Patient limited by pain    Behavior During Therapy [facility] for tasks assessed/performed                Past Medical History:  Diagnosis Date   Arthritis    in hands   Cancer [facility])    melanoma/ on hand and leg   Hemorrhoids    occasional   Hx of adenomatous colonic polyps 06/30/2017   Hyperlipemia    Hypothyroid    Inguinal hernia    bilateral   Joint pain    minor   Transverse myelitis ([place])    Wears glasses    Past Surgical History:  Procedure Laterality Date   BLEPHAROPLASTY Bilateral 2016   COLONOSCOPY     HERNIA REPAIR  2012   BIH   MELANOMA EXCISION     Patient Active Problem List   Diagnosis Date Noted   Aortic valve disorder 03/30/2022   Hardening of the aorta (main artery of the heart) ([place]) 03/30/2022   Elevated PSA 03/30/2022   Localized, primary osteoarthritis of hand 03/30/2022   Transverse myelitis ([place]) 03/30/2022   Rheumatoid arthritis ([place]) 03/30/2022   Other specified health status 03/30/2022   Prediabetes 03/30/2022   Vitamin B12 deficiency (non anemic) 03/30/2022   Exertional dyspnea 04/10/2020   Essential hypertension 06/23/2019   Positive QuantiFERON-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 07/12/2017   Hx of adenomatous colonic polyps 06/30/2017   Hypothyroidism 06/06/2017   Hyperlipidemia 05/14/2014   Inguinal hernia bilateral, non-recurrent 06/14/2011    PCP: Deland Pretty, MD  REFERRING PROVIDER: Lynne Leader   REFERRING DIAG: [phone] (ICD-10-CM) - Chronic right-sided low back pain with right-sided sciatica M25.551 (ICD-10-CM) -  Right hip pain  THERAPY DIAG:  Other low back pain  Muscle weakness (generalized)  Pain in right hip  ONSET DATE: about one month for back pain  SUBJECTIVE:                                                                                                                                                                                           SUBJECTIVE STATEMENT: States that he is  not doing great, reaching forward is still painful. States that he tried to do what was asked but was too painful. States driving is still painful and difficult to move.   EvaL: States that he has been having some symptoms in his leg and buttocks. States he saw the MD and he thinks it is a pinched nerve. States that he has felt a little bit better as he has taken the medication. States that the morning is the worse and as he walks it feels better. States that some positions. States that he was having back pain for about a month but it wasn't terrible and then on 03/29/22 he has severe pain in his leg.  States he wants to get back to golf. He notes he has been sitting more than usual  PERTINENT HISTORY:  RA and  transverse myelytis, hx of low back pain  PAIN:  Are you having pain? Yes: NPRS scale: at worse 6/10 Pain location: right buttocks and front thigh and into groin Pain description: sharp  Aggravating factors: sitting down, getting up Relieving factors: medication    PRECAUTIONS: None  WEIGHT BEARING RESTRICTIONS No  FALLS:  Has patient fallen in last 6 months? No   OCCUPATION: investor - part time worker  PLOF: Independent  PATIENT GOALS ikes to play golf but currently unable to   OBJECTIVE:   DIAGNOSTIC FINDINGS:  Xray 03/31/22 IMPRESSION: No recent fracture is seen in the lumbar spine. Lumbar spondylosis with disc space narrowing, bony spurs and facet hypertrophy. There is interval worsening of degenerative changes in the lumbar spine, particularly at L4-L5 level.   SCREENING FOR  RED FLAGS: Bowel or bladder incontinence: No Spinal tumors: No Cauda equina syndrome: No Compression fracture: No Abdominal aneurysm: No  COGNITION:  Overall cognitive status: Within functional limits for tasks assessed     SENSATION: WFL   POSTURE:  Sacral sitting, forward head, rounded shoulders.  PALPATION: Tenderness to palpation along lumbar paraspinals   LUMBAR ROM:   Active  A/PROM  04/05/2022  Flexion 75% limited pain down leg  Extension 100% limited less pain  Right lateral flexion 75% limited pain down leg  Left lateral flexion 75% limited pain down leg  Right rotation   Left rotation    (Blank rows = not tested)     LE Measurements Lower Extremity Right 04/05/2022 Left 04/05/2022   A/PROM MMT A/PROM MMT  Hip Flexion  4-  4  Hip Extension      Hip Abduction      Hip Adduction      Hip Internal rotation      Hip External rotation      Knee Flexion  4-  4  Knee Extension  4-  4  Ankle Dorsiflexion  4  4+  Ankle Plantarflexion      Ankle Inversion      Ankle Eversion       (Blank rows = not tested)  * pain   LUMBAR SPECIAL TESTS:  Slump test: Positive on right Unable to lay prone to test repeated extension, very tight in hip flexors  FUNCTIONAL TESTS:  STS - difficult requires use of UE    TODAY'S TREATMENT  05/03/2022 Neuro: Seated posture in chair, elevated seat heat and added pillow- groin pain abolished 7 minutes tots  Therapeutic Exercises: PROM to R hip in all directions 10 minutes  Manual Therapy: PA to lumbar spine grade I/II -L1-3 no symptoms no this date, STM to B QL and lumbar paraspinals  Previous interventions Mod fig 4 stretch 30 sec x 2 on R ;  Trial for pelvic tilts, better movement/ability in sitting; Trial for supine march, painful,  TA contraction 3 sec x 10, with education on achieving active contraction (increased radicular pain with bracing).    Prone:  Seated: Pelvic tilts x 15; sit to stand x 5 - with education on  back mechanics Standing: education and practice for bend/squat x 5, pt with question about how to lift laundry basket    Self Care:     PATIENT EDUCATION:  Education details: on posture with seated, on positioning, on HEP Person educated: Patient Education method: Explanation, Demonstration, and Handouts Education comprehension: verbalized understanding  HOME EXERCISE PROGRAM: MQDAGATE  ASSESSMENT:  CLINICAL IMPRESSION: 05/03/2022 Improved tolerance to interventions on this date. Educated patient on triggers and avoiding them (reducing time in low car, elevating seat height, continuing to try prone lying at home.) Reduced symptoms noted end of session with no pain sitting or standing still and slight pain with walking. Patient concerned as he is still having a bit of pain, encouraged patient to  f/u with MD secondary to MD wanted him to f/u post most recent injection. Will continue with current POC as tolerated.   Eval: Patient is a 81 y.o. male who was seen today for physical therapy evaluation and treatment for lumbar pain radiating into right leg. Most limitations occur first thing in the morning, and transitional movements after sitting for long period of time. Educated patient on posture and effect prolonged postures has on lumbar spine. Patient would greatly benefit from skilled PT to improve overall function and QOL.   OBJECTIVE IMPAIRMENTS decreased activity tolerance, decreased ROM, decreased strength, impaired perceived functional ability, impaired flexibility, improper body mechanics, postural dysfunction, and pain.   ACTIVITY LIMITATIONS community activity, occupation, yard work, and Office manager .   PERSONAL FACTORS Age, Fitness, and 1 comorbidity: transverse myelytis  are also affecting patient's functional outcome.    REHAB POTENTIAL: Good  CLINICAL DECISION MAKING: Stable/uncomplicated  EVALUATION COMPLEXITY: Low   GOALS: Goals reviewed with patient?  yes  SHORT TERM  GOALS:  Patient will be independent in self management strategies to improve quality of life and functional outcomes. Baseline: new program Target date: 05/24/2022 Goal status: PROGRESSING  2.  Patient will report at least 50% improvement in overall symptoms and/or function to demonstrate improved functional mobility Baseline: 0% Target date: 05/24/2022 Goal status: PROGRESSING  3.  Patient will be able to get up out of chair without use of arms or reported stiffness afterwards Baseline: unable Target date: 05/24/2022 Goal status: PROGRESSING     LONG TERM GOALS:  Patient will report at least 75% improvement in overall symptoms and/or function to demonstrate improved functional mobility Baseline: 0% Target date: 06/14/2022 Goal status: PROGRESSING  2.  Patient will be able to demonstrate lumbar ROM without radicular symptoms. Baseline: painful Target date: 06/14/2022 Goal status: PROGRESSING  3.  Patient will report returning to golf without difficulties secondary to pain in leg. Baseline: not playing golf Target date: 06/14/2022 Goal status: PROGRESSING     PLAN: PT FREQUENCY: 2x/week  PT DURATION: 6 weeks  PLANNED INTERVENTIONS: Therapeutic exercises, Therapeutic activity, Neuromuscular re-education, Balance training, Gait training, Patient/Family education, Joint mobilization, Dry Needling, Electrical stimulation, Spinal mobilization, Cryotherapy, Moist heat, Traction, Ionotophoresis '4mg'$ /ml Dexamethasone, and Manual therapy.  PLAN FOR NEXT SESSION: L1-3 PA light, laying over towel supine - f/uright lumbar rotation, traction, isometrics, ROM, prone mobilizations  11:49 AM, 05/03/22  Jerene Pitch, DPT Physical Therapy with [place]

## 2022-05-04 NOTE — Telephone Encounter (Signed)
Thank you for the update.  I have ordered an MRI of your lumbar spine and your right hip to evaluate the source of your pain more accurately.  You should hear in the next day or 2 about scheduling.

## 2022-05-04 NOTE — Telephone Encounter (Signed)
Spoke to pt and let him know MRI's were ordered. Pt instructed if he doesn't hear about scheduling to please let us know. Pt verbalized understanding.

## 2022-05-06 ENCOUNTER — Ambulatory Visit (INDEPENDENT_AMBULATORY_CARE_PROVIDER_SITE_OTHER): Payer: Medicare Other | Admitting: Physical Therapy

## 2022-05-06 ENCOUNTER — Encounter: Payer: Self-pay | Admitting: Physical Therapy

## 2022-05-06 DIAGNOSIS — M5459 Other low back pain: Secondary | ICD-10-CM

## 2022-05-06 DIAGNOSIS — M6281 Muscle weakness (generalized): Secondary | ICD-10-CM | POA: Diagnosis not present

## 2022-05-06 DIAGNOSIS — M25551 Pain in right hip: Secondary | ICD-10-CM | POA: Diagnosis not present

## 2022-05-06 NOTE — Therapy (Signed)
OUTPATIENT PHYSICAL THERAPY TREATMENT and Progress Note  Progress Note Reporting Period 04/05/22 to 05/06/22  See note below for Objective Data and Assessment of Progress/Goals.      Patient Name: Alec Snyder MRN: [id number] DOB:1941-06-27, 81 y.o., male Today's Date: 05/06/2022   PT End of Session - 05/06/22 1059     Visit Number 10    Number of Visits 12    Date for PT Re-Evaluation 05/17/22    Authorization Type medicare    Progress Note Due on Visit 10    PT Start Time 1102    PT Stop Time 1142    PT Time Calculation (min) 40 min    Activity Tolerance Patient limited by pain    Behavior During Therapy [facility] for tasks assessed/performed                Past Medical History:  Diagnosis Date   Arthritis    in hands   Cancer [facility])    melanoma/ on hand and leg   Hemorrhoids    occasional   Hx of adenomatous colonic polyps 06/30/2017   Hyperlipemia    Hypothyroid    Inguinal hernia    bilateral   Joint pain    minor   Transverse myelitis ([place])    Wears glasses    Past Surgical History:  Procedure Laterality Date   BLEPHAROPLASTY Bilateral 2016   COLONOSCOPY     HERNIA REPAIR  2012   BIH   MELANOMA EXCISION     Patient Active Problem List   Diagnosis Date Noted   Aortic valve disorder 03/30/2022   Hardening of the aorta (main artery of the heart) ([place]) 03/30/2022   Elevated PSA 03/30/2022   Localized, primary osteoarthritis of hand 03/30/2022   Transverse myelitis ([place]) 03/30/2022   Rheumatoid arthritis ([place]) 03/30/2022   Other specified health status 03/30/2022   Prediabetes 03/30/2022   Vitamin B12 deficiency (non anemic) 03/30/2022   Exertional dyspnea 04/10/2020   Essential hypertension 06/23/2019   Positive QuantiFERON-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 07/12/2017   Hx of adenomatous colonic polyps 06/30/2017   Hypothyroidism 06/06/2017   Hyperlipidemia 05/14/2014   Inguinal hernia bilateral, non-recurrent 06/14/2011    PCP: Deland Pretty, MD  REFERRING  PROVIDER: Lynne Leader   REFERRING DIAG: [phone] (ICD-10-CM) - Chronic right-sided low back pain with right-sided sciatica M25.551 (ICD-10-CM) - Right hip pain  THERAPY DIAG:  Other low back pain  Muscle weakness (generalized)  Pain in right hip  ONSET DATE: about one month for back pain  SUBJECTIVE:  SUBJECTIVE STATEMENT: States that he continues to have pain but it is not as bad. States that driving is better with the support in the car. States that he feels about 5/10. States that he feels about 25% better since the start of therapy. States that he laid on the floor since last session and he didn't have pain. Patient is getting an MRI tomorrow   EvaL: States that he has been having some symptoms in his leg and buttocks. States he saw the MD and he thinks it is a pinched nerve. States that he has felt a little bit better as he has taken the medication. States that the morning is the worse and as he walks it feels better. States that some positions. States that he was having back pain for about a month but it wasn't terrible and then on 03/29/22 he has severe pain in his leg.  States he wants to get back to golf. He notes he has been sitting more than usual  PERTINENT HISTORY:  RA and  transverse myelytis, hx of low back pain  PAIN:  Are you having pain? Yes: NPRS scale: at worse 6/10 Pain location: right buttocks and front thigh and into groin Pain description: sharp  Aggravating factors: sitting down, getting up Relieving factors: medication    PRECAUTIONS: None  WEIGHT BEARING RESTRICTIONS No  FALLS:  Has patient fallen in last 6 months? No   OCCUPATION: investor - part time worker  PLOF: Independent  PATIENT GOALS ikes to play golf but currently unable to   OBJECTIVE:    DIAGNOSTIC FINDINGS:  Xray 03/31/22 IMPRESSION: No recent fracture is seen in the lumbar spine. Lumbar spondylosis with disc space narrowing, bony spurs and facet hypertrophy. There is interval worsening of degenerative changes in the lumbar spine, particularly at L4-L5 level.   SCREENING FOR RED FLAGS: Bowel or bladder incontinence: No Spinal tumors: No Cauda equina syndrome: No Compression fracture: No Abdominal aneurysm: No  COGNITION:  Overall cognitive status: Within functional limits for tasks assessed     SENSATION: WFL   POSTURE:  Sacral sitting, forward head, rounded shoulders.  PALPATION: Tenderness to palpation along lumbar paraspinals   LUMBAR ROM:   Active  A/PROM  05/06/2022  Flexion 75% limited pain down leg  Extension 75%  stretches  Right lateral flexion 75% pain in right leg  Left lateral flexion 50% limited no change  Right rotation   Left rotation    (Blank rows = not tested)     LE Measurements Lower Extremity Right 4/24 Left 4/24   A/PROM MMT A/PROM MMT  Hip Flexion  4-  4  Hip Extension      Hip Abduction      Hip Adduction      Hip Internal rotation      Hip External rotation      Knee Flexion  4-  4  Knee Extension  4-  4  Ankle Dorsiflexion  4  4+  Ankle Plantarflexion      Ankle Inversion      Ankle Eversion       (Blank rows = not tested)  * pain    FUNCTIONAL TESTS:  STS - no UE no pain    TODAY'S TREATMENT  05/06/2022 Neuro: posture, how to sit, how to move pelvis vs low back - sitting on sit bones vs tail bone - using towel roll to assist -15 minutes  Therapeutic Exercises: stranding lumbar ext at wall 15 minute total, supine lying  over towel in lumbar spine 5 minutes    Previous interventions Mod fig 4 stretch 30 sec x 2 on R ;  Trial for pelvic tilts, better movement/ability in sitting; Trial for supine march, painful,  TA contraction 3 sec x 10, with education on achieving active contraction (increased  radicular pain with bracing).    Prone:  Seated: Pelvic tilts x 15; sit to stand x 5 - with education on back mechanics Standing: education and practice for bend/squat x 5, pt with question about how to lift laundry basket    Self Care:     PATIENT EDUCATION:  Education details: on posture on anatomy, on PN findings, on POC and focus moving forward Person educated: Patient Education method: Consulting civil engineer, Media planner, and Handouts Education comprehension: verbalized understanding  HOME EXERCISE PROGRAM: MQDAGATE  ASSESSMENT:  CLINICAL IMPRESSION: 05/06/2022 Patient is making progress each session though with continued pain. Motion improved and tolerated standing lumbar extension at wall with reduced symptoms. Instructed patient to perform throughout the day. Assessed and discussed sitting posture and how to reduce lumbar tension. Will continue with current POC as tolerated.   Eval: Patient is a 81 y.o. male who was seen today for physical therapy evaluation and treatment for lumbar pain radiating into right leg. Most limitations occur first thing in the morning, and transitional movements after sitting for long period of time. Educated patient on posture and effect prolonged postures has on lumbar spine. Patient would greatly benefit from skilled PT to improve overall function and QOL.   OBJECTIVE IMPAIRMENTS decreased activity tolerance, decreased ROM, decreased strength, impaired perceived functional ability, impaired flexibility, improper body mechanics, postural dysfunction, and pain.   ACTIVITY LIMITATIONS community activity, occupation, yard work, and Office manager .   PERSONAL FACTORS Age, Fitness, and 1 comorbidity: transverse myelytis  are also affecting patient's functional outcome.    REHAB POTENTIAL: Good  CLINICAL DECISION MAKING: Stable/uncomplicated  EVALUATION COMPLEXITY: Low   GOALS: Goals reviewed with patient?  yes  SHORT TERM GOALS:  Patient will be independent  in self management strategies to improve quality of life and functional outcomes. Baseline: new program Target date: 05/27/2022 Goal status: MET  2.  Patient will report at least 50% improvement in overall symptoms and/or function to demonstrate improved functional mobility Baseline: 0% Target date: 05/27/2022 Goal status: PROGRESSING  3.  Patient will be able to get up out of chair without use of arms or reported stiffness afterwards Baseline: unable Target date: 05/27/2022 Goal status: MET     LONG TERM GOALS:  Patient will report at least 75% improvement in overall symptoms and/or function to demonstrate improved functional mobility Baseline: 0% Target date: 06/17/2022 Goal status: PROGRESSING  2.  Patient will be able to demonstrate lumbar ROM without radicular symptoms. Baseline: painful Target date: 06/17/2022 Goal status: PROGRESSING  3.  Patient will report returning to golf without difficulties secondary to pain in leg. Baseline: not playing golf Target date: 06/17/2022 Goal status: PROGRESSING     PLAN: PT FREQUENCY: 2x/week  PT DURATION: 6 weeks  PLANNED INTERVENTIONS: Therapeutic exercises, Therapeutic activity, Neuromuscular re-education, Balance training, Gait training, Patient/Family education, Joint mobilization, Dry Needling, Electrical stimulation, Spinal mobilization, Cryotherapy, Moist heat, Traction, Ionotophoresis 27m/ml Dexamethasone, and Manual therapy.  PLAN FOR NEXT SESSION: L1-3 PA light, laying over towel supine - f/uright lumbar rotation, traction, isometrics, ROM, prone mobilizations  12:16 PM, 05/06/22 MJerene Pitch DPT [facility]

## 2022-05-07 ENCOUNTER — Ambulatory Visit
Admission: RE | Admit: 2022-05-07 | Discharge: 2022-05-07 | Disposition: A | Discharge status: Home / Self Care | Payer: Medicare Other | Source: Ambulatory Visit | Attending: Family Medicine | Admitting: Family Medicine

## 2022-05-07 DIAGNOSIS — G8929 Other chronic pain: Secondary | ICD-10-CM

## 2022-05-07 DIAGNOSIS — M25551 Pain in right hip: Secondary | ICD-10-CM | POA: Diagnosis not present

## 2022-05-07 DIAGNOSIS — M5416 Radiculopathy, lumbar region: Secondary | ICD-10-CM | POA: Diagnosis not present

## 2022-05-07 DIAGNOSIS — M48061 Spinal stenosis, lumbar region without neurogenic claudication: Secondary | ICD-10-CM | POA: Diagnosis not present

## 2022-05-07 DIAGNOSIS — M545 Low back pain, unspecified: Secondary | ICD-10-CM | POA: Diagnosis not present

## 2022-05-11 ENCOUNTER — Encounter: Payer: Self-pay | Admitting: Physical Therapy

## 2022-05-11 ENCOUNTER — Ambulatory Visit (INDEPENDENT_AMBULATORY_CARE_PROVIDER_SITE_OTHER): Payer: Medicare Other | Admitting: Physical Therapy

## 2022-05-11 DIAGNOSIS — M6281 Muscle weakness (generalized): Secondary | ICD-10-CM | POA: Diagnosis not present

## 2022-05-11 DIAGNOSIS — M5459 Other low back pain: Secondary | ICD-10-CM | POA: Diagnosis not present

## 2022-05-11 DIAGNOSIS — M25551 Pain in right hip: Secondary | ICD-10-CM

## 2022-05-11 NOTE — Progress Notes (Unsigned)
I, Alec Snyder, LAT, ATC, am serving as scribe for [name].  Alec Snyder is a 81 y.o. male who presents to [place] at [facility] today for f/u of R hip and low back pain w/ radiating pain into the R anterior thigh and to review his R hip and L-spine MRI results.  He was last seen by [name] on 04/27/22 and noted constant pain despite having completed 7 PT visits.  He had a R hip femoral acetabular steroid injection.  He was encouraged to con't PT and has now completed 11 visits.  Today, pt reports that he is feeling worse in terms of his R lower back, R hip and R leg pain.  He notes his pain is now more distal into his R leg w/ new pain radiating into his R anterior lower leg.  He also reports some intermittent paresthesias.  He denies axial low back pain.  Diagnostic testing: R hip and L-spine MRI-05/07/22;   03/30/22 L-spine XR             07/20/17 T-spine MRI             07/22/16 T-spine MRI             06/03/16 T-spine MRI             03/08/09 L-spine MRI  Pertinent review of systems: No fevers or chills  Relevant historical information: Rheumatoid arthritis and history of transverse myelitis.   Exam:  BP (!) 148/74 (BP Location: Right Arm, Patient Position: Sitting, Cuff Size: Normal)   Pulse 61   Ht '5\' 8"'$  (1.727 m)   Wt 159 lb 12.8 oz (72.5 kg)   SpO2 95%   BMI 24.30 kg/m  General: Well Developed, well nourished, and in no acute distress.   MSK: L-spine: Nontender midline.  Decreased lumbar motion. Lower extremity strength is intact. Right hip: Tender palpation lateral hip and greater trochanter.   Lab and Radiology Results  EXAM: MR OF THE RIGHT HIP WITHOUT CONTRAST   TECHNIQUE: Multiplanar, multisequence MR imaging was performed. No intravenous contrast was administered.   COMPARISON:  X-ray 04/27/2022, CT 06/25/2016   FINDINGS: Bones: Compression fracture of L4, which will be more fully characterized on concurrently obtained  dedicated lumbar spine MRI. No acute fracture of the pelvis or hips. No dislocation. No femoral head avascular necrosis. No significant arthropathy of the left hip. Bony pelvis intact without diastasis. Mild osteoarthritis of the bilateral SI joints. Pubic symphysis within normal limits. No bone marrow edema. No marrow replacing bone lesion.   Articular cartilage and labrum   Articular cartilage: Chondral thinning and surface irregularity, most pronounced along the anterosuperior aspect the right hip joint. No subchondral marrow signal changes.   Labrum: Superior labral degeneration and tearing. No paralabral cyst.   Joint or bursal effusion   Joint effusion:  Small right hip joint effusion.   Bursae: No abnormal bursal fluid collection.   Muscles and tendons   Muscles and tendons: Tendinosis of the bilateral gluteus medius and minimus tendons with low-grade insertional tears. The hamstring, iliopsoas, rectus femoris, and adductor tendons appear intact without tear or significant tendinosis. Normal muscle bulk and signal intensity without edema, atrophy, or fatty infiltration.   Other findings   Miscellaneous: No soft tissue edema or fluid collection. No inguinal lymphadenopathy. Prostate gland is mildly enlarged and heterogeneous.   IMPRESSION: 1. No acute osseous abnormality of the pelvis or hips. 2. Compression fracture of L4, which  will be more fully characterized on concurrently obtained dedicated lumbar spine MRI. 3. Mild osteoarthritis of the right hip with small right hip joint effusion. 4. Bilateral gluteus medius and minimus tendinosis. 5. Mild prostatomegaly.     Electronically Signed   By: Davina Poke D.O.   On: 05/11/2022 08:53  EXAM: MRI LUMBAR SPINE WITHOUT CONTRAST   TECHNIQUE: Multiplanar, multisequence MR imaging of the lumbar spine was performed. No intravenous contrast was administered.   COMPARISON:  No recent comparison    FINDINGS: Segmentation:  Standard.   Alignment:  No significant listhesis.   Vertebrae: Degenerative endplate irregularity, greatest from L3-L4 to L5-S1. There is likely degenerative marrow edema at the right superior L4 endplate extending into the right pedicle.   Conus medullaris and cauda equina: Conus extends to the T12-L1 level. Conus and cauda equina appear normal.   Paraspinal and other soft tissues: Unremarkable.   Disc levels:   L1-L2:  No canal or foraminal stenosis.   L2-L3: Disc bulge slightly eccentric to the left. Mild facet arthropathy. Minor canal stenosis. Slight effacement of subarticular recesses. Minor right and mild left foraminal stenosis.   L3-L4: Disc bulge with superimposed right central/subarticular extrusion extending below disc level. Moderate facet arthropathy with ligamentum flavum infolding. Marked canal stenosis. Effacement of the right greater than left subarticular recesses. Mild to moderate right and mild left foraminal stenosis.   L4-L5: Marked disc space narrowing. Disc bulge with endplate osteophytic ridging. Moderate facet arthropathy with ligamentum flavum infolding. Mild canal stenosis. Partial effacement of the subarticular recesses. Mild to moderate right and mild left foraminal stenosis.   L5-S1: Marked disc space narrowing. Disc bulge with endplate osteophytic ridging. Mild facet arthropathy. No canal or foraminal stenosis.   IMPRESSION: Multilevel degenerative changes as detailed above. Most notably, there is marked canal stenosis with effacement of right greater than left subarticular recesses at L3-L4. This is predominantly due to a disc extrusion and there is compression of the traversing right L4 nerve roots.     Electronically Signed   By: Macy Mis M.D.   On: 05/11/2022 08:49  I, Lynne Leader, personally (independently) visualized and performed the interpretation of the images attached in this  note.   Assessment and Plan: 81 y.o. male with right leg pain thought primarily due to be L4 lumbar radiculopathy based on lumbar spine MRI.  He also has some lateral hip pain due to the gluteus tendinopathy seen on hip MRI and likely some anterior hip pain due to the hip arthritis seen on MRI.  There are some concern that he may have an L4 compression fracture.  However he does not have significant axial back pain in this area indicating this is more likely due to bony edema and bruising secondary to his DJD than a compression fracture.  Plan to treat with epidural steroid injection.  Reviewed the MRI results with Mr Velis.  Be updated after the injection.  He should stop aspirin now.   PDMP not reviewed this encounter. Orders Placed This Encounter  Procedures   DG INJECT DIAG/THERA/INC NEEDLE/CATH/PLC EPI/LUMB/SAC W/IMG    Lumbar ESI for lumbar radiculopathy.  Level and technique per radiology. LUMB EPI #1 MCR/BCBS PACS (05/07/22) 159 LBS *ASA '81MG'$ -PT LAST TOOK ASA 5/31-OK PER DR. Nelia Shi  *SCREENED* PT AWARE NO SHOW FEE    Standing Status:   Future    Standing Expiration Date:   05/13/2023    Scheduling Instructions:     Lumbar ESI for lumbar radiculopathy.  Level  and technique per radiology.    Order Specific Question:   Reason for Exam (SYMPTOM  OR DIAGNOSIS REQUIRED)    Answer:   lumbar radiculopathy    Order Specific Question:   Preferred Imaging Location?    Answer:   [id number]   No orders of the defined types were placed in this encounter.    Discussed warning signs or symptoms. Please see discharge instructions. Patient expresses understanding.   The above documentation has been reviewed and is accurate and complete Lynne Leader, M.D.  Total encounter time 20 minutes including face-to-face time with the patient and, reviewing past medical record, and charting on the date of service.

## 2022-05-11 NOTE — Therapy (Signed)
OUTPATIENT PHYSICAL THERAPY TREATMENT and Progress Note  Progress Note Reporting Period 04/05/22 to 05/06/22  See note below for Objective Data and Assessment of Progress/Goals.      Patient Name: Alec Snyder MRN: [id number] DOB:1941/11/06, 81 y.o., male Today's Date: 05/11/2022   PT End of Session - 05/11/22 1302     Visit Number 11    Number of Visits 12    Date for PT Re-Evaluation 05/17/22    Authorization Type medicare    Progress Note Due on Visit 10    PT Start Time 5686    PT Stop Time 1342    PT Time Calculation (min) 38 min    Activity Tolerance Patient limited by pain    Behavior During Therapy [facility] for tasks assessed/performed                Past Medical History:  Diagnosis Date   Arthritis    in hands   Cancer [facility])    melanoma/ on hand and leg   Hemorrhoids    occasional   Hx of adenomatous colonic polyps 06/30/2017   Hyperlipemia    Hypothyroid    Inguinal hernia    bilateral   Joint pain    minor   Transverse myelitis ([place])    Wears glasses    Past Surgical History:  Procedure Laterality Date   BLEPHAROPLASTY Bilateral 2016   COLONOSCOPY     HERNIA REPAIR  2012   BIH   MELANOMA EXCISION     Patient Active Problem List   Diagnosis Date Noted   Aortic valve disorder 03/30/2022   Hardening of the aorta (main artery of the heart) ([place]) 03/30/2022   Elevated PSA 03/30/2022   Localized, primary osteoarthritis of hand 03/30/2022   Transverse myelitis ([place]) 03/30/2022   Rheumatoid arthritis ([place]) 03/30/2022   Other specified health status 03/30/2022   Prediabetes 03/30/2022   Vitamin B12 deficiency (non anemic) 03/30/2022   Exertional dyspnea 04/10/2020   Essential hypertension 06/23/2019   Positive QuantiFERON-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 07/12/2017   Hx of adenomatous colonic polyps 06/30/2017   Hypothyroidism 06/06/2017   Hyperlipidemia 05/14/2014   Inguinal hernia bilateral, non-recurrent 06/14/2011    PCP: Deland Pretty, MD  REFERRING  PROVIDER: Lynne Leader   REFERRING DIAG: [phone] (ICD-10-CM) - Chronic right-sided low back pain with right-sided sciatica M25.551 (ICD-10-CM) - Right hip pain  THERAPY DIAG:  Other low back pain  Muscle weakness (generalized)  Pain in right hip  ONSET DATE: about one month for back pain  SUBJECTIVE:  SUBJECTIVE STATEMENT: States he had his imaging and it wasn't as bad as he though. States that it has been a little bit more pain on the right side. States that he can't bend over or lift anything   EvaL: States that he has been having some symptoms in his leg and buttocks. States he saw the MD and he thinks it is a pinched nerve. States that he has felt a little bit better as he has taken the medication. States that the morning is the worse and as he walks it feels better. States that some positions. States that he was having back pain for about a month but it wasn't terrible and then on 03/29/22 he has severe pain in his leg.  States he wants to get back to golf. He notes he has been sitting more than usual  PERTINENT HISTORY:  RA and  transverse myelytis, hx of low back pain  PAIN:  Are you having pain? Yes: NPRS scale: at worse 7/10 Pain location: right buttocks and front thigh and into groin Pain description: sharp  Aggravating factors: sitting down, getting up Relieving factors: medication    PRECAUTIONS: None  WEIGHT BEARING RESTRICTIONS No  FALLS:  Has patient fallen in last 6 months? No   OCCUPATION: investor - part time worker  PLOF: Independent  PATIENT GOALS ikes to play golf but currently unable to   OBJECTIVE:   DIAGNOSTIC FINDINGS:  Xray 03/31/22 IMPRESSION: No recent fracture is seen in the lumbar spine. Lumbar spondylosis with disc space narrowing, bony spurs and  facet hypertrophy. There is interval worsening of degenerative changes in the lumbar spine, particularly at L4-L5 level.   SCREENING FOR RED FLAGS: Bowel or bladder incontinence: No Spinal tumors: No Cauda equina syndrome: No Compression fracture: No Abdominal aneurysm: No  COGNITION:  Overall cognitive status: Within functional limits for tasks assessed     SENSATION: WFL   POSTURE:  Sacral sitting, forward head, rounded shoulders.  PALPATION: Tenderness to palpation along lumbar paraspinals   LUMBAR ROM:   Active  A/PROM  05/06/2022  Flexion 75% limited pain down leg  Extension 75%  stretches  Right lateral flexion 75% pain in right leg  Left lateral flexion 50% limited no change  Right rotation   Left rotation    (Blank rows = not tested)     LE Measurements Lower Extremity Right 4/24 Left 4/24   A/PROM MMT A/PROM MMT  Hip Flexion  4-  4  Hip Extension      Hip Abduction      Hip Adduction      Hip Internal rotation      Hip External rotation      Knee Flexion  4-  4  Knee Extension  4-  4  Ankle Dorsiflexion  4  4+  Ankle Plantarflexion      Ankle Inversion      Ankle Eversion       (Blank rows = not tested)  * pain    FUNCTIONAL TESTS:  STS - no UE no pain    TODAY'S TREATMENT  05/11/2022 Therapeutic Exercises: stranding lumbar ext at wall 4x10, LTR 5 minutes  Manual: IASTM with percussion gun to lumbar musculature 15 minutes, lumbar traction in right ROT - 10 minutes   Previous interventions Mod fig 4 stretch 30 sec x 2 on R ;  Trial for pelvic tilts, better movement/ability in sitting; Trial for supine march, painful,  TA contraction 3 sec x 10, with education  on achieving active contraction (increased radicular pain with bracing).    Prone:  Seated: Pelvic tilts x 15; sit to stand x 5 - with education on back mechanics Standing: education and practice for bend/squat x 5, pt with question about how to lift laundry basket    Self  Care:     PATIENT EDUCATION:  Education details: on percussion gun Person educated: Patient Education method: Explanation, Media planner, and Handouts Education comprehension: verbalized understanding  HOME EXERCISE PROGRAM: MQDAGATE  ASSESSMENT:  CLINICAL IMPRESSION: 05/11/2022 Tolerated percussion gun well with reduction in symptoms to 3/10. Discussed following up with MD about continued pain and MRI results. Overall patient is improving but still functionally limited at this time.   Eval: Patient is a 81 y.o. male who was seen today for physical therapy evaluation and treatment for lumbar pain radiating into right leg. Most limitations occur first thing in the morning, and transitional movements after sitting for long period of time. Educated patient on posture and effect prolonged postures has on lumbar spine. Patient would greatly benefit from skilled PT to improve overall function and QOL.   OBJECTIVE IMPAIRMENTS decreased activity tolerance, decreased ROM, decreased strength, impaired perceived functional ability, impaired flexibility, improper body mechanics, postural dysfunction, and pain.   ACTIVITY LIMITATIONS community activity, occupation, yard work, and Office manager .   PERSONAL FACTORS Age, Fitness, and 1 comorbidity: transverse myelytis  are also affecting patient's functional outcome.    REHAB POTENTIAL: Good  CLINICAL DECISION MAKING: Stable/uncomplicated  EVALUATION COMPLEXITY: Low   GOALS: Goals reviewed with patient?  yes  SHORT TERM GOALS:  Patient will be independent in self management strategies to improve quality of life and functional outcomes. Baseline: new program Target date: 06/01/2022 Goal status: MET  2.  Patient will report at least 50% improvement in overall symptoms and/or function to demonstrate improved functional mobility Baseline: 0% Target date: 06/01/2022 Goal status: PROGRESSING  3.  Patient will be able to get up out of chair without  use of arms or reported stiffness afterwards Baseline: unable Target date: 06/01/2022 Goal status: MET     LONG TERM GOALS:  Patient will report at least 75% improvement in overall symptoms and/or function to demonstrate improved functional mobility Baseline: 0% Target date: 06/22/2022 Goal status: PROGRESSING  2.  Patient will be able to demonstrate lumbar ROM without radicular symptoms. Baseline: painful Target date: 06/22/2022 Goal status: PROGRESSING  3.  Patient will report returning to golf without difficulties secondary to pain in leg. Baseline: not playing golf Target date: 06/22/2022 Goal status: PROGRESSING     PLAN: PT FREQUENCY: 2x/week  PT DURATION: 6 weeks  PLANNED INTERVENTIONS: Therapeutic exercises, Therapeutic activity, Neuromuscular re-education, Balance training, Gait training, Patient/Family education, Joint mobilization, Dry Needling, Electrical stimulation, Spinal mobilization, Cryotherapy, Moist heat, Traction, Ionotophoresis 37m/ml Dexamethasone, and Manual therapy.  PLAN FOR NEXT SESSION: L1-3 PA light, laying over towel supine - f/uright lumbar rotation, traction, isometrics, ROM, prone mobilizations  2:28 PM, 05/11/22 MJerene Pitch DPT [facility]

## 2022-05-12 ENCOUNTER — Ambulatory Visit (INDEPENDENT_AMBULATORY_CARE_PROVIDER_SITE_OTHER): Payer: Medicare Other | Admitting: Family Medicine

## 2022-05-12 ENCOUNTER — Encounter: Payer: Self-pay | Admitting: Family Medicine

## 2022-05-12 VITALS — BP 148/74 | HR 61 | Ht 68.0 in | Wt 159.8 lb

## 2022-05-12 DIAGNOSIS — M5441 Lumbago with sciatica, right side: Secondary | ICD-10-CM

## 2022-05-12 DIAGNOSIS — G8929 Other chronic pain: Secondary | ICD-10-CM

## 2022-05-12 DIAGNOSIS — M5416 Radiculopathy, lumbar region: Secondary | ICD-10-CM

## 2022-05-12 NOTE — Patient Instructions (Addendum)
Good to see you today.  I've ordered a lumbar epidural.  Please call [facility] at [phone] or [phone] to schedule.  Follow-up: as needed

## 2022-05-13 ENCOUNTER — Encounter: Payer: Medicare Other | Admitting: Physical Therapy

## 2022-05-13 ENCOUNTER — Ambulatory Visit
Admission: RE | Admit: 2022-05-13 | Discharge: 2022-05-13 | Disposition: A | Discharge status: Home / Self Care | Payer: Medicare Other | Source: Ambulatory Visit | Attending: Family Medicine | Admitting: Family Medicine

## 2022-05-13 ENCOUNTER — Other Ambulatory Visit: Payer: Self-pay | Admitting: Family Medicine

## 2022-05-13 DIAGNOSIS — M5116 Intervertebral disc disorders with radiculopathy, lumbar region: Secondary | ICD-10-CM | POA: Diagnosis not present

## 2022-05-13 DIAGNOSIS — G8929 Other chronic pain: Secondary | ICD-10-CM

## 2022-05-13 DIAGNOSIS — M5416 Radiculopathy, lumbar region: Secondary | ICD-10-CM

## 2022-05-13 DIAGNOSIS — M47817 Spondylosis without myelopathy or radiculopathy, lumbosacral region: Secondary | ICD-10-CM | POA: Diagnosis not present

## 2022-05-13 MED ORDER — METHYLPREDNISOLONE ACETATE 40 MG/ML INJ SUSP (RADIOLOG
80.0000 mg | Freq: Once | INTRAMUSCULAR | Status: AC
  Administered 2022-05-13: 80 mg via EPIDURAL

## 2022-05-13 MED ORDER — IOPAMIDOL (ISOVUE-M 200) INJECTION 41%
1.0000 mL | Freq: Once | INTRAMUSCULAR | Status: AC
  Administered 2022-05-13: 1 mL via EPIDURAL

## 2022-05-13 NOTE — Discharge Instructions (Signed)

## 2022-05-17 ENCOUNTER — Ambulatory Visit (INDEPENDENT_AMBULATORY_CARE_PROVIDER_SITE_OTHER): Payer: Medicare Other | Admitting: Physical Therapy

## 2022-05-17 ENCOUNTER — Encounter: Payer: Self-pay | Admitting: Physical Therapy

## 2022-05-17 DIAGNOSIS — M6281 Muscle weakness (generalized): Secondary | ICD-10-CM | POA: Diagnosis not present

## 2022-05-17 DIAGNOSIS — M25551 Pain in right hip: Secondary | ICD-10-CM | POA: Diagnosis not present

## 2022-05-17 DIAGNOSIS — M5459 Other low back pain: Secondary | ICD-10-CM

## 2022-05-17 NOTE — Therapy (Addendum)
OUTPATIENT PHYSICAL THERAPY TREATMENT and RECERT Note     Patient Name: Alec Snyder MRN: [id number] DOB:[birth year], 81 y.o., male Today's Date: 05/17/2022   PT End of Session - 05/17/22 1328     Visit Number 12    Number of Visits 24    Date for PT Re-Evaluation 06/28/22    Authorization Type medicare    Progress Note Due on Visit 10    PT Start Time 1305    PT Stop Time 1345    PT Time Calculation (min) 40 min    Activity Tolerance Patient limited by pain    Behavior During Therapy [facility] for tasks assessed/performed                 Past Medical History:  Diagnosis Date   Arthritis    in hands   Cancer [facility])    melanoma/ on hand and leg   Hemorrhoids    occasional   Hx of adenomatous colonic polyps 06/30/2017   Hyperlipemia    Hypothyroid    Inguinal hernia    bilateral   Joint pain    minor   Transverse myelitis ([place])    Wears glasses    Past Surgical History:  Procedure Laterality Date   BLEPHAROPLASTY Bilateral 2016   COLONOSCOPY     HERNIA REPAIR  2012   BIH   MELANOMA EXCISION     Patient Active Problem List   Diagnosis Date Noted   Aortic valve disorder 03/30/2022   Hardening of the aorta (main artery of the heart) ([place]) 03/30/2022   Elevated PSA 03/30/2022   Localized, primary osteoarthritis of hand 03/30/2022   Transverse myelitis ([place]) 03/30/2022   Rheumatoid arthritis ([place]) 03/30/2022   Other specified health status 03/30/2022   Prediabetes 03/30/2022   Vitamin B12 deficiency (non anemic) 03/30/2022   Exertional dyspnea 04/10/2020   Essential hypertension 06/23/2019   Positive QuantiFERON-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 07/12/2017   Hx of adenomatous colonic polyps 06/30/2017   Hypothyroidism 06/06/2017   Hyperlipidemia 05/14/2014   Inguinal hernia bilateral, non-recurrent 06/14/2011    PCP: Deland Pretty, MD  REFERRING PROVIDER: Lynne Leader   REFERRING DIAG: [phone] (ICD-10-CM) - Chronic right-sided low back pain with right-sided  sciatica M25.551 (ICD-10-CM) - Right hip pain  THERAPY DIAG:  Other low back pain  Muscle weakness (generalized)  Pain in right hip  ONSET DATE: about one month for back pain  SUBJECTIVE:  SUBJECTIVE STATEMENT: States that he got an injection on Thursday and pain is is still there but he thinks he can reach down a little bit further. States that he did his exercises and was still in a bit of pain.  EvaL: States that he has been having some symptoms in his leg and buttocks. States he saw the MD and he thinks it is a pinched nerve. States that he has felt a little bit better as he has taken the medication. States that the morning is the worse and as he walks it feels better. States that some positions. States that he was having back pain for about a month but it wasn't terrible and then on 03/29/22 he has severe pain in his leg.  States he wants to get back to golf. He notes he has been sitting more than usual  PERTINENT HISTORY:  RA and  transverse myelytis, hx of low back pain  PAIN:  Are you having pain? Yes: NPRS scale: at worse 6/10 Pain location: right buttocks and front thigh and into groin Pain description: sharp  Aggravating factors: sitting down, getting up Relieving factors: medication    PRECAUTIONS: None  WEIGHT BEARING RESTRICTIONS No  FALLS:  Has patient fallen in last 6 months? No   OCCUPATION: investor - part time worker  PLOF: Independent  PATIENT GOALS ikes to play golf but currently unable to   OBJECTIVE:   DIAGNOSTIC FINDINGS:  Xray 03/31/22 IMPRESSION: No recent fracture is seen in the lumbar spine. Lumbar spondylosis with disc space narrowing, bony spurs and facet hypertrophy. There is interval worsening of degenerative changes in the lumbar spine, particularly at  L4-L5 level.   SCREENING FOR RED FLAGS: Bowel or bladder incontinence: No Spinal tumors: No Cauda equina syndrome: No Compression fracture: No Abdominal aneurysm: No  COGNITION:  Overall cognitive status: Within functional limits for tasks assessed     SENSATION: WFL   POSTURE:  Sacral sitting, forward head, rounded shoulders.  PALPATION: Tenderness to palpation along lumbar paraspinals   LUMBAR ROM:   Active  A/PROM  05/06/2022  Flexion 75% limited pain down leg  Extension 75%  stretches  Right lateral flexion 75% pain in right leg  Left lateral flexion 50% limited no change  Right rotation   Left rotation    (Blank rows = not tested)     LE Measurements Lower Extremity Right 4/24 Left 4/24   A/PROM MMT A/PROM MMT  Hip Flexion  4-  4  Hip Extension      Hip Abduction      Hip Adduction      Hip Internal rotation      Hip External rotation      Knee Flexion  4-  4  Knee Extension  4-  4  Ankle Dorsiflexion  4  4+  Ankle Plantarflexion      Ankle Inversion      Ankle Eversion       (Blank rows = not tested)  * pain    FUNCTIONAL TESTS:  STS - no UE no pain    TODAY'S TREATMENT  05/17/2022 Therapeutic Exercises: self traction seated and supine x5 minutes each, LTR, hip IR/ER in hook lying - PT assist Neuro- TRA activation with education - long exhale 10 minutes total   Manual: Lumbar traction with bed sheet 10 minutes, long axis traction right Leg 5 minutes  Previous interventions Mod fig 4 stretch 30 sec x 2 on R ;  Trial for pelvic tilts,  better movement/ability in sitting; Trial for supine march, painful,  TA contraction 3 sec x 10, with education on achieving active contraction (increased radicular pain with bracing).    Prone:  Seated: Pelvic tilts x 15; sit to stand x 5 - with education on back mechanics Standing: education and practice for bend/squat x 5, pt with question about how to lift laundry basket    Self Care:     PATIENT  EDUCATION:  Education details: on TRA activation, on intraabdominal pressure, on core engagement Person educated: Patient Education method: Explanation, Demonstration, and Handouts Education comprehension: verbalized understanding  HOME EXERCISE PROGRAM: MQDAGATE  ASSESSMENT:  CLINICAL IMPRESSION: 05/17/2022 Session focused on education, traction and teaching TRA activation to help with core activation and reducing intra-abdominal pressure. Tolerated well and reduced pain noted end of session. Added traction to HEP. Will continue with current POC.   Eval: Patient is a 81 y.o. male who was seen today for physical therapy evaluation and treatment for lumbar pain radiating into right leg. Most limitations occur first thing in the morning, and transitional movements after sitting for long period of time. Educated patient on posture and effect prolonged postures has on lumbar spine. Patient would greatly benefit from skilled PT to improve overall function and QOL.   OBJECTIVE IMPAIRMENTS decreased activity tolerance, decreased ROM, decreased strength, impaired perceived functional ability, impaired flexibility, improper body mechanics, postural dysfunction, and pain.   ACTIVITY LIMITATIONS community activity, occupation, yard work, and Office manager .   PERSONAL FACTORS Age, Fitness, and 1 comorbidity: transverse myelytis  are also affecting patient's functional outcome.    REHAB POTENTIAL: Good  CLINICAL DECISION MAKING: Stable/uncomplicated  EVALUATION COMPLEXITY: Low   GOALS: Goals reviewed with patient?  yes  SHORT TERM GOALS:  Patient will be independent in self management strategies to improve quality of life and functional outcomes. Baseline: new program Target date: 06/07/2022 Goal status: MET  2.  Patient will report at least 50% improvement in overall symptoms and/or function to demonstrate improved functional mobility Baseline: 0% Target date: 06/07/2022 Goal status:  PROGRESSING  3.  Patient will be able to get up out of chair without use of arms or reported stiffness afterwards Baseline: unable Target date: 06/07/2022 Goal status: MET     LONG TERM GOALS:  Patient will report at least 75% improvement in overall symptoms and/or function to demonstrate improved functional mobility Baseline: 0% Target date: 06/28/2022 Goal status: PROGRESSING  2.  Patient will be able to demonstrate lumbar ROM without radicular symptoms. Baseline: painful Target date: 06/28/2022 Goal status: PROGRESSING  3.  Patient will report returning to golf without difficulties secondary to pain in leg. Baseline: not playing golf Target date: 06/28/2022 Goal status: PROGRESSING     PLAN: PT FREQUENCY: 2x/week  PT DURATION: 6 weeks  PLANNED INTERVENTIONS: Therapeutic exercises, Therapeutic activity, Neuromuscular re-education, Balance training, Gait training, Patient/Family education, Joint mobilization, Dry Needling, Electrical stimulation, Spinal mobilization, Cryotherapy, Moist heat, Traction, Ionotophoresis 29m/ml Dexamethasone, and Manual therapy.  PLAN FOR NEXT SESSION: L1-3 PA light, laying over towel supine - f/uright lumbar rotation, traction, isometrics, ROM, prone mobilizations  2:07 PM, 05/17/22 MJerene Pitch DPT Physical Therapy with [facility]

## 2022-05-17 NOTE — Addendum Note (Signed)
Addended by: Jerene Pitch R on: 05/17/2022 02:12 PM   Modules accepted: Orders

## 2022-05-18 ENCOUNTER — Other Ambulatory Visit: Payer: Medicare Other

## 2022-05-19 ENCOUNTER — Other Ambulatory Visit: Payer: Self-pay | Admitting: Family Medicine

## 2022-05-20 ENCOUNTER — Encounter: Payer: Medicare Other | Admitting: Physical Therapy

## 2022-05-20 ENCOUNTER — Encounter: Payer: Self-pay | Admitting: Physical Therapy

## 2022-05-20 ENCOUNTER — Ambulatory Visit: Payer: Medicare Other | Admitting: Physical Therapy

## 2022-05-20 DIAGNOSIS — M25551 Pain in right hip: Secondary | ICD-10-CM

## 2022-05-20 DIAGNOSIS — M6281 Muscle weakness (generalized): Secondary | ICD-10-CM

## 2022-05-20 DIAGNOSIS — M5459 Other low back pain: Secondary | ICD-10-CM

## 2022-05-20 NOTE — Therapy (Addendum)
OUTPATIENT PHYSICAL THERAPY TREATMENT     Patient Name: Alec Snyder MRN: [id number] DOB:[birth year], 81 y.o., male Today's Date: 05/20/2022   PT End of Session - 05/20/22 1516     Visit Number 13    Number of Visits 24    Date for PT Re-Evaluation 06/28/22    Authorization Type medicare    Progress Note Due on Visit 10    PT Start Time 0354    PT Stop Time 1555    PT Time Calculation (min) 39 min    Activity Tolerance Patient limited by pain    Behavior During Therapy [facility] for tasks assessed/performed                 Past Medical History:  Diagnosis Date   Arthritis    in hands   Cancer ([place])    melanoma/ on hand and leg   Hemorrhoids    occasional   Hx of adenomatous colonic polyps 06/30/2017   Hyperlipemia    Hypothyroid    Inguinal hernia    bilateral   Joint pain    minor   Transverse myelitis ([place])    Wears glasses    Past Surgical History:  Procedure Laterality Date   BLEPHAROPLASTY Bilateral 2016   COLONOSCOPY     HERNIA REPAIR  2012   BIH   MELANOMA EXCISION     Patient Active Problem List   Diagnosis Date Noted   Aortic valve disorder 03/30/2022   Hardening of the aorta (main artery of the heart) ([place]) 03/30/2022   Elevated PSA 03/30/2022   Localized, primary osteoarthritis of hand 03/30/2022   Transverse myelitis ([place]) 03/30/2022   Rheumatoid arthritis ([place]) 03/30/2022   Other specified health status 03/30/2022   Prediabetes 03/30/2022   Vitamin B12 deficiency (non anemic) 03/30/2022   Exertional dyspnea 04/10/2020   Essential hypertension 06/23/2019   Positive QuantiFERON-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 07/12/2017   Hx of adenomatous colonic polyps 06/30/2017   Hypothyroidism 06/06/2017   Hyperlipidemia 05/14/2014   Inguinal hernia bilateral, non-recurrent 06/14/2011    PCP: Deland Pretty, MD  REFERRING PROVIDER: Lynne Leader   REFERRING DIAG: [phone] (ICD-10-CM) - Chronic right-sided low back pain with right-sided sciatica M25.551  (ICD-10-CM) - Right hip pain  THERAPY DIAG:  Other low back pain  Pain in right hip  Muscle weakness (generalized)  ONSET DATE: about one month for back pain  SUBJECTIVE:                                                                                                                                                                                           SUBJECTIVE STATEMENT: States  he got his percussion gun and wants to know how to use it. But otherwise feels about the same with numbness in leg.  EvaL: States that he has been having some symptoms in his leg and buttocks. States he saw the MD and he thinks it is a pinched nerve. States that he has felt a little bit better as he has taken the medication. States that the morning is the worse and as he walks it feels better. States that some positions. States that he was having back pain for about a month but it wasn't terrible and then on 03/29/22 he has severe pain in his leg.  States he wants to get back to golf. He notes he has been sitting more than usual  PERTINENT HISTORY:  RA and  transverse myelytis, hx of low back pain  PAIN:  Are you having pain? Yes: NPRS scale: at worse 6/10 Pain location: right buttocks and front thigh and into groin Pain description: sharp  Aggravating factors: sitting down, getting up Relieving factors: medication    PRECAUTIONS: None  WEIGHT BEARING RESTRICTIONS No  FALLS:  Has patient fallen in last 6 months? No   OCCUPATION: investor - part time worker  PLOF: Independent  PATIENT GOALS ikes to play golf but currently unable to   OBJECTIVE:   DIAGNOSTIC FINDINGS:  Xray 03/31/22 IMPRESSION: No recent fracture is seen in the lumbar spine. Lumbar spondylosis with disc space narrowing, bony spurs and facet hypertrophy. There is interval worsening of degenerative changes in the lumbar spine, particularly at L4-L5 level.   SCREENING FOR RED FLAGS: Bowel or bladder incontinence:  No Spinal tumors: No Cauda equina syndrome: No Compression fracture: No Abdominal aneurysm: No  COGNITION:  Overall cognitive status: Within functional limits for tasks assessed     SENSATION: WFL   POSTURE:  Sacral sitting, forward head, rounded shoulders.  PALPATION: Tenderness to palpation along lumbar paraspinals   LUMBAR ROM:   Active  A/PROM  05/06/2022  Flexion 75% limited pain down leg  Extension 75%  stretches  Right lateral flexion 75% pain in right leg  Left lateral flexion 50% limited no change  Right rotation   Left rotation    (Blank rows = not tested)     LE Measurements Lower Extremity Right 4/24 Left 4/24   A/PROM MMT A/PROM MMT  Hip Flexion  4-  4  Hip Extension      Hip Abduction      Hip Adduction      Hip Internal rotation      Hip External rotation      Knee Flexion  4-  4  Knee Extension  4-  4  Ankle Dorsiflexion  4  4+  Ankle Plantarflexion      Ankle Inversion      Ankle Eversion       (Blank rows = not tested)  * pain    FUNCTIONAL TESTS:  STS - no UE no pain    TODAY'S TREATMENT  05/20/2022 Therapeutic Exercises: seated trunk ROT R, trunk SB stopped pain - 6 minutes Neuro- TRA activation with education - long exhale 20 minutes total seated - tactile cues seated - standing and with walking Gait: fast walking pushing self along 10 minutes  Previous interventions Mod fig 4 stretch 30 sec x 2 on R ;  Trial for pelvic tilts, better movement/ability in sitting; Trial for supine march, painful,  TA contraction 3 sec x 10, with education on achieving active contraction (increased  radicular pain with bracing).    Prone:  Seated: Pelvic tilts x 15; sit to stand x 5 - with education on back mechanics Standing: education and practice for bend/squat x 5, pt with question about how to lift laundry basket    Self Care:     PATIENT EDUCATION:  Education details: on TRA activation, on intraabdominal pressure, on core engagement, on  use of ice and where on use of percussion gun and where, on anatomy and nerve impingement Person educated: Patient Education method: Explanation, Demonstration, and Handouts Education comprehension: verbalized understanding  HOME EXERCISE PROGRAM: MQDAGATE  ASSESSMENT:  CLINICAL IMPRESSION: 05/20/2022  Session focused on education and revisited TRA activation. Tolerated this better today with upright position and tactile cues.Reduced pain with walking and STS with core activation. Educated patient on seated rotation and walking mechanics. Will continue with current POC as tolerated.   Eval: Patient is a 81 y.o. male who was seen today for physical therapy evaluation and treatment for lumbar pain radiating into right leg. Most limitations occur first thing in the morning, and transitional movements after sitting for long period of time. Educated patient on posture and effect prolonged postures has on lumbar spine. Patient would greatly benefit from skilled PT to improve overall function and QOL.   OBJECTIVE IMPAIRMENTS decreased activity tolerance, decreased ROM, decreased strength, impaired perceived functional ability, impaired flexibility, improper body mechanics, postural dysfunction, and pain.   ACTIVITY LIMITATIONS community activity, occupation, yard work, and Office manager .   PERSONAL FACTORS Age, Fitness, and 1 comorbidity: transverse myelytis  are also affecting patient's functional outcome.    REHAB POTENTIAL: Good  CLINICAL DECISION MAKING: Stable/uncomplicated  EVALUATION COMPLEXITY: Low   GOALS: Goals reviewed with patient?  yes  SHORT TERM GOALS:  Patient will be independent in self management strategies to improve quality of life and functional outcomes. Baseline: new program Target date: 06/07/2022 Goal status: MET  2.  Patient will report at least 50% improvement in overall symptoms and/or function to demonstrate improved functional mobility Baseline: 0% Target date:  06/07/2022 Goal status: PROGRESSING  3.  Patient will be able to get up out of chair without use of arms or reported stiffness afterwards Baseline: unable Target date: 06/07/2022 Goal status: MET     LONG TERM GOALS:  Patient will report at least 75% improvement in overall symptoms and/or function to demonstrate improved functional mobility Baseline: 0% Target date: 06/28/2022 Goal status: PROGRESSING  2.  Patient will be able to demonstrate lumbar ROM without radicular symptoms. Baseline: painful Target date: 06/28/2022 Goal status: PROGRESSING  3.  Patient will report returning to golf without difficulties secondary to pain in leg. Baseline: not playing golf Target date: 06/28/2022 Goal status: PROGRESSING     PLAN: PT FREQUENCY: 2x/week  PT DURATION: 6 weeks  PLANNED INTERVENTIONS: Therapeutic exercises, Therapeutic activity, Neuromuscular re-education, Balance training, Gait training, Patient/Family education, Joint mobilization, Dry Needling, Electrical stimulation, Spinal mobilization, Cryotherapy, Moist heat, Traction, Ionotophoresis 40m/ml Dexamethasone, and Manual therapy.  PLAN FOR NEXT SESSION: L1-3 PA light, laying over towel supine - f/uright lumbar rotation, traction, isometrics, ROM, prone mobilizations  4:46 PM, 05/20/22 MJerene Pitch DPT [facility]

## 2022-05-24 ENCOUNTER — Telehealth: Payer: Self-pay

## 2022-05-24 ENCOUNTER — Ambulatory Visit: Payer: Medicare Other | Admitting: Physical Therapy

## 2022-05-24 ENCOUNTER — Encounter: Payer: Self-pay | Admitting: Physical Therapy

## 2022-05-24 DIAGNOSIS — M25551 Pain in right hip: Secondary | ICD-10-CM

## 2022-05-24 DIAGNOSIS — M5459 Other low back pain: Secondary | ICD-10-CM

## 2022-05-24 DIAGNOSIS — M6281 Muscle weakness (generalized): Secondary | ICD-10-CM

## 2022-05-24 NOTE — Telephone Encounter (Signed)
Pt called the office reporting no relief from lumbar ESI on 6/1 and wanted to know what to do. After discussing options, pt would like to come back for a f/u visit to discuss w/ [name]. Pt notes he is having constant low back pain. Pt scheduled for a next day visit, 6/13.

## 2022-05-24 NOTE — Therapy (Signed)
OUTPATIENT PHYSICAL THERAPY TREATMENT and RECERT Note     Patient Name: Dontrey Snellgrove MRN: [id number] DOB:04/16/1941, 81 y.o., male Today's Date: 05/24/2022   PT End of Session - 05/24/22 1015     Visit Number 14    Number of Visits 24    Date for PT Re-Evaluation 06/28/22    Authorization Type medicare    Progress Note Due on Visit 10    PT Start Time 1016    PT Stop Time 1055    PT Time Calculation (min) 39 min    Activity Tolerance Patient limited by pain    Behavior During Therapy [facility] for tasks assessed/performed                 Past Medical History:  Diagnosis Date   Arthritis    in hands   Cancer ([place])    melanoma/ on hand and leg   Hemorrhoids    occasional   Hx of adenomatous colonic polyps 06/30/2017   Hyperlipemia    Hypothyroid    Inguinal hernia    bilateral   Joint pain    minor   Transverse myelitis ([place])    Wears glasses    Past Surgical History:  Procedure Laterality Date   BLEPHAROPLASTY Bilateral 2016   COLONOSCOPY     HERNIA REPAIR  2012   BIH   MELANOMA EXCISION     Patient Active Problem List   Diagnosis Date Noted   Aortic valve disorder 03/30/2022   Hardening of the aorta (main artery of the heart) ([place]) 03/30/2022   Elevated PSA 03/30/2022   Localized, primary osteoarthritis of hand 03/30/2022   Transverse myelitis ([place]) 03/30/2022   Rheumatoid arthritis ([place]) 03/30/2022   Other specified health status 03/30/2022   Prediabetes 03/30/2022   Vitamin B12 deficiency (non anemic) 03/30/2022   Exertional dyspnea 04/10/2020   Essential hypertension 06/23/2019   Positive QuantiFERON-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 07/12/2017   Hx of adenomatous colonic polyps 06/30/2017   Hypothyroidism 06/06/2017   Hyperlipidemia 05/14/2014   Inguinal hernia bilateral, non-recurrent 06/14/2011    PCP: Deland Pretty, MD  REFERRING PROVIDER: Lynne Leader   REFERRING DIAG: [phone] (ICD-10-CM) - Chronic right-sided low back pain with right-sided  sciatica M25.551 (ICD-10-CM) - Right hip pain  THERAPY DIAG:  Other low back pain  Pain in right hip  Muscle weakness (generalized)  ONSET DATE: about one month for back pain  SUBJECTIVE:  SUBJECTIVE STATEMENT: States he got his percussion gun and wants to know how to use it. But otherwise feels about the same with numbness in leg.  EvaL: States that he has been having some symptoms in his leg and buttocks. States he saw the MD and he thinks it is a pinched nerve. States that he has felt a little bit better as he has taken the medication. States that the morning is the worse and as he walks it feels better. States that some positions. States that he was having back pain for about a month but it wasn't terrible and then on 03/29/22 he has severe pain in his leg.  States he wants to get back to golf. He notes he has been sitting more than usual  PERTINENT HISTORY:  RA and  transverse myelytis, hx of low back pain  PAIN:  Are you having pain? Yes: NPRS scale: at worse 6/10 Pain location: right buttocks and front thigh and into groin Pain description: sharp  Aggravating factors: sitting down, getting up Relieving factors: medication    PRECAUTIONS: None  WEIGHT BEARING RESTRICTIONS No  FALLS:  Has patient fallen in last 6 months? No   OCCUPATION: investor - part time worker  PLOF: Independent  PATIENT GOALS ikes to play golf but currently unable to   OBJECTIVE:   DIAGNOSTIC FINDINGS:  Xray 03/31/22 IMPRESSION: No recent fracture is seen in the lumbar spine. Lumbar spondylosis with disc space narrowing, bony spurs and facet hypertrophy. There is interval worsening of degenerative changes in the lumbar spine, particularly at L4-L5 level.   SCREENING FOR RED FLAGS: Bowel or bladder  incontinence: No Spinal tumors: No Cauda equina syndrome: No Compression fracture: No Abdominal aneurysm: No  COGNITION:  Overall cognitive status: Within functional limits for tasks assessed     SENSATION: WFL   POSTURE:  Sacral sitting, forward head, rounded shoulders.  PALPATION: Tenderness to palpation along lumbar paraspinals   LUMBAR ROM:   Active  A/PROM  05/06/2022  Flexion 75% limited pain down leg  Extension 75%  stretches  Right lateral flexion 75% pain in right leg  Left lateral flexion 50% limited no change  Right rotation   Left rotation    (Blank rows = not tested)     LE Measurements Lower Extremity Right 4/24 Left 4/24   A/PROM MMT A/PROM MMT  Hip Flexion  4-  4  Hip Extension      Hip Abduction      Hip Adduction      Hip Internal rotation      Hip External rotation      Knee Flexion  4-  4  Knee Extension  4-  4  Ankle Dorsiflexion  4  4+  Ankle Plantarflexion      Ankle Inversion      Ankle Eversion       (Blank rows = not tested)  * pain    FUNCTIONAL TESTS:  STS - no UE no pain    TODAY'S TREATMENT  05/24/2022 Therapeutic Exercises:  Prone - not tolerated today - increase in pain - severe Left side lying PROM right hip with stretches, contract/relax into abd/add and flexion/extension,  Seated hip abd/add iso  3 minutes   Manual: traction of right leg, SIJ position stretch leg off table - tolerated well  Previous interventions Mod fig 4 stretch 30 sec x 2 on R ;  Trial for pelvic tilts, better movement/ability in sitting; Trial for supine march, painful,  TA  contraction 3 sec x 10, with education on achieving active contraction (increased radicular pain with bracing).    Prone:  Seated: Pelvic tilts x 15; sit to stand x 5 - with education on back mechanics Standing: education and practice for bend/squat x 5, pt with question about how to lift laundry basket    Self Care:     PATIENT EDUCATION:  Education details: on  aquatic therapy and using friend's pool to float and walk for decompression Person educated: Patient Education method: Explanation, Demonstration, and Handouts Education comprehension: verbalized understanding  HOME EXERCISE PROGRAM: MQDAGATE  ASSESSMENT:  CLINICAL IMPRESSION: 05/24/2022  Discussed and educated patient on possible benefit of aquatics for decompression of lumbar spine. Tolerated SIJ and R LE traction well but returned pain with hip abd/add isometrics in sitting. Discussed following up with MD as pain persists. Answered questions regarding different types of injections.  Eval: Patient is a 81 y.o. male who was seen today for physical therapy evaluation and treatment for lumbar pain radiating into right leg. Most limitations occur first thing in the morning, and transitional movements after sitting for long period of time. Educated patient on posture and effect prolonged postures has on lumbar spine. Patient would greatly benefit from skilled PT to improve overall function and QOL.   OBJECTIVE IMPAIRMENTS decreased activity tolerance, decreased ROM, decreased strength, impaired perceived functional ability, impaired flexibility, improper body mechanics, postural dysfunction, and pain.   ACTIVITY LIMITATIONS community activity, occupation, yard work, and Office manager .   PERSONAL FACTORS Age, Fitness, and 1 comorbidity: transverse myelytis  are also affecting patient's functional outcome.    REHAB POTENTIAL: Good  CLINICAL DECISION MAKING: Stable/uncomplicated  EVALUATION COMPLEXITY: Low   GOALS: Goals reviewed with patient?  yes  SHORT TERM GOALS:  Patient will be independent in self management strategies to improve quality of life and functional outcomes. Baseline: new program Target date: 06/07/2022 Goal status: MET  2.  Patient will report at least 50% improvement in overall symptoms and/or function to demonstrate improved functional mobility Baseline: 0% Target  date: 06/07/2022 Goal status: PROGRESSING  3.  Patient will be able to get up out of chair without use of arms or reported stiffness afterwards Baseline: unable Target date: 06/07/2022 Goal status: MET     LONG TERM GOALS:  Patient will report at least 75% improvement in overall symptoms and/or function to demonstrate improved functional mobility Baseline: 0% Target date: 06/28/2022 Goal status: PROGRESSING  2.  Patient will be able to demonstrate lumbar ROM without radicular symptoms. Baseline: painful Target date: 06/28/2022 Goal status: PROGRESSING  3.  Patient will report returning to golf without difficulties secondary to pain in leg. Baseline: not playing golf Target date: 06/28/2022 Goal status: PROGRESSING     PLAN: PT FREQUENCY: 2x/week  PT DURATION: 6 weeks  PLANNED INTERVENTIONS: Therapeutic exercises, Therapeutic activity, Neuromuscular re-education, Balance training, Gait training, Patient/Family education, Joint mobilization, Dry Needling, Electrical stimulation, Spinal mobilization, Cryotherapy, Moist heat, Traction, Ionotophoresis 71m/ml Dexamethasone, and Manual therapy.  PLAN FOR NEXT SESSION: L1-3 PA light, laying over towel supine - f/uright lumbar rotation, traction, isometrics, ROM, prone mobilizations  12:00 PM, 05/24/22 MJerene Pitch DPT [facility]

## 2022-05-24 NOTE — Progress Notes (Signed)
I, Alec Snyder, LAT, ATC, am serving as scribe for [name].  Alec Snyder is a 81 y.o. male who presents to [place] [facility] today for f/u of R hip and low back pain w/ radiating pain into the R anterior thigh and lower leg w/ intermittent paresthesias.  He was last seen by [name] on 05/12/22 and noted worsening symptoms.  He was referred for a lumbar ESI (R L4 nerve root block and transforaminal ESI ) that he had on 05/13/22 and was advised to con't PT of which he's completed 14 visits.  Prior to that he had a R hip femoral acetabular steroid injection.Today, pt reports no benefit from [facility]. Pt reports constant low back pain. Increased pain noted with sitting and standing. Pt feels that pain is worsening each day and is having intermittent feeling of R leg weakness. Pt notes PCP increased his gabapentin dosage.  Diagnostic testing: R hip and L-spine MRI-05/07/22;              03/30/22 L-spine XR             07/20/17 T-spine MRI             07/22/16 T-spine MRI             06/03/16 T-spine MRI             03/08/09 L-spine MRI  Pertinent review of systems: No fevers or chills  Relevant historical information: History of transverse myelitis.   Exam:  BP 136/74   Pulse 78   Ht '5\' 8"'$  (1.727 m)   Wt 156 lb 12.8 oz (71.1 kg)   SpO2 96%   BMI 23.84 kg/m  General: Well Developed, well nourished, and in no acute distress.   MSK: L-spine: Nontender midline. Tender palpation at just lateral to the right SI joint and iliac crest. Mildly tender palpation at greater trochanter. Normal hip motion. Lower extremity strength is intact.    Lab and Radiology Results  Procedure: Real-time Ultrasound Guided Injection of right hip posterior iliac crest just lateral to the SI joint Device: Philips Affiniti 50G Images permanently stored and available for review in PACS Verbal informed consent obtained.  Discussed risks and benefits of procedure. Warned about infection,  bleeding, hyperglycemia damage to structures among others. Patient expresses understanding and agreement Time-out conducted.   Noted no overlying erythema, induration, or other signs of local infection.   Skin prepped in a sterile fashion.   Local anesthesia: Topical Ethyl chloride.   With sterile technique and under real time ultrasound guidance: 40 mg of Kenalog and 2 mL of Marcaine injected into gluteus medius origin. Fluid seen entering the gluteus medius origin.   Completed without difficulty   Pain moderately immediately resolved suggesting accurate placement of the medication.   Advised to call if fevers/chills, erythema, induration, drainage, or persistent bleeding.   Images permanently stored and available for review in the ultrasound unit.  Impression: Technically successful ultrasound guided injection.   EXAM: MR OF THE RIGHT HIP WITHOUT CONTRAST   TECHNIQUE: Multiplanar, multisequence MR imaging was performed. No intravenous contrast was administered.   COMPARISON:  X-ray 04/27/2022, CT 06/25/2016   FINDINGS: Bones: Compression fracture of L4, which will be more fully characterized on concurrently obtained dedicated lumbar spine MRI. No acute fracture of the pelvis or hips. No dislocation. No femoral head avascular necrosis. No significant arthropathy of the left hip. Bony pelvis intact without diastasis. Mild osteoarthritis of the  bilateral SI joints. Pubic symphysis within normal limits. No bone marrow edema. No marrow replacing bone lesion.   Articular cartilage and labrum   Articular cartilage: Chondral thinning and surface irregularity, most pronounced along the anterosuperior aspect the right hip joint. No subchondral marrow signal changes.   Labrum: Superior labral degeneration and tearing. No paralabral cyst.   Joint or bursal effusion   Joint effusion:  Small right hip joint effusion.   Bursae: No abnormal bursal fluid collection.   Muscles and  tendons   Muscles and tendons: Tendinosis of the bilateral gluteus medius and minimus tendons with low-grade insertional tears. The hamstring, iliopsoas, rectus femoris, and adductor tendons appear intact without tear or significant tendinosis. Normal muscle bulk and signal intensity without edema, atrophy, or fatty infiltration.   Other findings   Miscellaneous: No soft tissue edema or fluid collection. No inguinal lymphadenopathy. Prostate gland is mildly enlarged and heterogeneous.   IMPRESSION: 1. No acute osseous abnormality of the pelvis or hips. 2. Compression fracture of L4, which will be more fully characterized on concurrently obtained dedicated lumbar spine MRI. 3. Mild osteoarthritis of the right hip with small right hip joint effusion. 4. Bilateral gluteus medius and minimus tendinosis. 5. Mild prostatomegaly.     Electronically Signed   By: Davina Poke D.O.   On: 05/11/2022 08:53  EXAM: MRI LUMBAR SPINE WITHOUT CONTRAST   TECHNIQUE: Multiplanar, multisequence MR imaging of the lumbar spine was performed. No intravenous contrast was administered.   COMPARISON:  No recent comparison   FINDINGS: Segmentation:  Standard.   Alignment:  No significant listhesis.   Vertebrae: Degenerative endplate irregularity, greatest from L3-L4 to L5-S1. There is likely degenerative marrow edema at the right superior L4 endplate extending into the right pedicle.   Conus medullaris and cauda equina: Conus extends to the T12-L1 level. Conus and cauda equina appear normal.   Paraspinal and other soft tissues: Unremarkable.   Disc levels:   L1-L2:  No canal or foraminal stenosis.   L2-L3: Disc bulge slightly eccentric to the left. Mild facet arthropathy. Minor canal stenosis. Slight effacement of subarticular recesses. Minor right and mild left foraminal stenosis.   L3-L4: Disc bulge with superimposed right central/subarticular extrusion extending below disc  level. Moderate facet arthropathy with ligamentum flavum infolding. Marked canal stenosis. Effacement of the right greater than left subarticular recesses. Mild to moderate right and mild left foraminal stenosis.   L4-L5: Marked disc space narrowing. Disc bulge with endplate osteophytic ridging. Moderate facet arthropathy with ligamentum flavum infolding. Mild canal stenosis. Partial effacement of the subarticular recesses. Mild to moderate right and mild left foraminal stenosis.   L5-S1: Marked disc space narrowing. Disc bulge with endplate osteophytic ridging. Mild facet arthropathy. No canal or foraminal stenosis.   IMPRESSION: Multilevel degenerative changes as detailed above. Most notably, there is marked canal stenosis with effacement of right greater than left subarticular recesses at L3-L4. This is predominantly due to a disc extrusion and there is compression of the traversing right L4 nerve roots.     Electronically Signed   By: Macy Mis M.D.   On: 05/11/2022 08:49 I, Lynne Leader, personally (independently) visualized and performed the interpretation of the images attached in this note.     Assessment and Plan: 81 y.o. male with right posterior buttocks pain radiating to the right leg.  Etiology of this pain is somewhat unclear.  Most likely explanation is right L4 lumbar radiculopathy.  However he did not have much improvement at all  with a targeted epidural steroid injection at the right L4 nerve root on June 1 at [place].  We spent time talking about his potential pain generators today.  We will try targeted injection at source of maximum pain at posterior gluteus area.  If this is not sufficient would recommend a facet injection at right L4-L5 and possibly a repeat epidural steroid injection attempt.  This is still not sufficient recommend second opinion/consultation with PMNR or potential neurosurgery.  Dr. Ronnald Ramp and Dr. Davy Pique would be a logical  choice.  He will keep me updated.  PDMP not reviewed this encounter. Orders Placed This Encounter  Procedures   [place] LIMITED JOINT SPACE STRUCTURES LOW RIGHT(NO LINKED CHARGES)    Order Specific Question:   Reason for Exam (SYMPTOM  OR DIAGNOSIS REQUIRED)    Answer:   right hip pain    Order Specific Question:   Preferred imaging location?    Answer:   [place]   No orders of the defined types were placed in this encounter.    Discussed warning signs or symptoms. Please see discharge instructions. Patient expresses understanding.   The above documentation has been reviewed and is accurate and complete Lynne Leader, M.D.  Total encounter time 40 minutes including face-to-face time with the patient and, reviewing past medical record, and charting on the date of service.   Extensive discussion and planning prior to and following the visit.  Time-based billing excludes time performing injection.

## 2022-05-25 ENCOUNTER — Ambulatory Visit: Payer: Medicare Other | Admitting: Family Medicine

## 2022-05-25 ENCOUNTER — Ambulatory Visit: Payer: Self-pay

## 2022-05-25 ENCOUNTER — Ambulatory Visit (INDEPENDENT_AMBULATORY_CARE_PROVIDER_SITE_OTHER): Payer: Medicare Other | Admitting: Family Medicine

## 2022-05-25 VITALS — BP 136/74 | HR 78 | Ht 68.0 in | Wt 156.8 lb

## 2022-05-25 DIAGNOSIS — M25551 Pain in right hip: Secondary | ICD-10-CM

## 2022-05-25 DIAGNOSIS — G8929 Other chronic pain: Secondary | ICD-10-CM | POA: Diagnosis not present

## 2022-05-25 DIAGNOSIS — M5416 Radiculopathy, lumbar region: Secondary | ICD-10-CM

## 2022-05-25 DIAGNOSIS — M5441 Lumbago with sciatica, right side: Secondary | ICD-10-CM

## 2022-05-25 NOTE — Patient Instructions (Addendum)
Thank you for coming in today.   Call or go to the ER if you develop a large red swollen joint with extreme pain or oozing puss.    If this shot is not helpful next step would be different spine injection (facet joint) and likely repeat epidural injection.   A second opinion with Dr Davy Pique and Dr Ronnald Ramp is reasonable.   Please continue physical therapy exercises.   Keep me updated.

## 2022-05-27 ENCOUNTER — Telehealth: Payer: Self-pay | Admitting: Family Medicine

## 2022-05-27 ENCOUNTER — Encounter: Payer: Self-pay | Admitting: Physical Therapy

## 2022-05-27 ENCOUNTER — Ambulatory Visit: Payer: Medicare Other | Admitting: Physical Therapy

## 2022-05-27 DIAGNOSIS — G8929 Other chronic pain: Secondary | ICD-10-CM

## 2022-05-27 DIAGNOSIS — M25551 Pain in right hip: Secondary | ICD-10-CM

## 2022-05-27 DIAGNOSIS — M5459 Other low back pain: Secondary | ICD-10-CM

## 2022-05-27 DIAGNOSIS — M6281 Muscle weakness (generalized): Secondary | ICD-10-CM

## 2022-05-27 DIAGNOSIS — M47816 Spondylosis without myelopathy or radiculopathy, lumbar region: Secondary | ICD-10-CM

## 2022-05-27 NOTE — Telephone Encounter (Signed)
Patient called to let Dr Georgina Snell know that since the injection on Tuesday, he has not had any improvement.

## 2022-05-27 NOTE — Therapy (Signed)
OUTPATIENT PHYSICAL THERAPY TREATMENT and RECERT Note     Patient Name: Alec Snyder MRN: [id number] DOB:16-Mar-1941, 81 y.o., male Today's Date: 05/27/2022   PT End of Session - 05/27/22 1058     Visit Number 15    Number of Visits 24    Date for PT Re-Evaluation 06/28/22    Authorization Type medicare    Progress Note Due on Visit 10    PT Start Time 1100    PT Stop Time 1140    PT Time Calculation (min) 40 min    Activity Tolerance Patient limited by pain    Behavior During Therapy [facility] for tasks assessed/performed                 Past Medical History:  Diagnosis Date   Arthritis    in hands   Cancer [facility])    melanoma/ on hand and leg   Hemorrhoids    occasional   Hx of adenomatous colonic polyps 06/30/2017   Hyperlipemia    Hypothyroid    Inguinal hernia    bilateral   Joint pain    minor   Transverse myelitis ([place])    Wears glasses    Past Surgical History:  Procedure Laterality Date   BLEPHAROPLASTY Bilateral 2016   COLONOSCOPY     HERNIA REPAIR  2012   BIH   MELANOMA EXCISION     Patient Active Problem List   Diagnosis Date Noted   Aortic valve disorder 03/30/2022   Hardening of the aorta (main artery of the heart) ([place]) 03/30/2022   Elevated PSA 03/30/2022   Localized, primary osteoarthritis of hand 03/30/2022   Transverse myelitis ([place]) 03/30/2022   Rheumatoid arthritis ([place]) 03/30/2022   Other specified health status 03/30/2022   Prediabetes 03/30/2022   Vitamin B12 deficiency (non anemic) 03/30/2022   Exertional dyspnea 04/10/2020   Essential hypertension 06/23/2019   Positive QuantiFERON-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 07/12/2017   Hx of adenomatous colonic polyps 06/30/2017   Hypothyroidism 06/06/2017   Hyperlipidemia 05/14/2014   Inguinal hernia bilateral, non-recurrent 06/14/2011    PCP: Deland Pretty, MD  REFERRING PROVIDER: Lynne Leader   REFERRING DIAG: [id number] (ICD-10-CM) - Chronic right-sided low back pain with right-sided  sciatica M25.551 (ICD-10-CM) - Right hip pain  THERAPY DIAG:  Other low back pain  Pain in right hip  Muscle weakness (generalized)  ONSET DATE: about one month for back pain  SUBJECTIVE:  SUBJECTIVE STATEMENT: States that he got an injection and maybe feeling  about the same is going to call the MD today. No changes after last session.   EvaL: States that he has been having some symptoms in his leg and buttocks. States he saw the MD and he thinks it is a pinched nerve. States that he has felt a little bit better as he has taken the medication. States that the morning is the worse and as he walks it feels better. States that some positions. States that he was having back pain for about a month but it wasn't terrible and then on 03/29/22 he has severe pain in his leg.  States he wants to get back to golf. He notes he has been sitting more than usual  PERTINENT HISTORY:  RA and  transverse myelytis, hx of low back pain  PAIN:  Are you having pain? Yes: NPRS scale: at worse 6/10 Pain location: right buttocks and buttocks  Pain description: sharp  Aggravating factors: sitting down, getting up Relieving factors: medication    PRECAUTIONS: None  WEIGHT BEARING RESTRICTIONS No  FALLS:  Has patient fallen in last 6 months? No   OCCUPATION: investor - part time worker  PLOF: Independent  PATIENT GOALS ikes to play golf but currently unable to   OBJECTIVE:   DIAGNOSTIC FINDINGS:  Xray 03/31/22 IMPRESSION: No recent fracture is seen in the lumbar spine. Lumbar spondylosis with disc space narrowing, bony spurs and facet hypertrophy. There is interval worsening of degenerative changes in the lumbar spine, particularly at L4-L5 level.   SCREENING FOR RED FLAGS: Bowel or bladder incontinence:  No Spinal tumors: No Cauda equina syndrome: No Compression fracture: No Abdominal aneurysm: No  COGNITION:  Overall cognitive status: Within functional limits for tasks assessed     SENSATION: WFL   POSTURE:  Sacral sitting, forward head, rounded shoulders.  PALPATION: Tenderness to palpation along lumbar paraspinals   LUMBAR ROM:   Active  A/PROM  05/06/2022  Flexion 75% limited pain down leg  Extension 75%  stretches  Right lateral flexion 75% pain in right leg  Left lateral flexion 50% limited no change  Right rotation   Left rotation    (Blank rows = not tested)     LE Measurements Lower Extremity Right 4/24 Left 4/24   A/PROM MMT A/PROM MMT  Hip Flexion  4-  4  Hip Extension      Hip Abduction      Hip Adduction      Hip Internal rotation      Hip External rotation      Knee Flexion  4-  4  Knee Extension  4-  4  Ankle Dorsiflexion  4  4+  Ankle Plantarflexion      Ankle Inversion      Ankle Eversion       (Blank rows = not tested)  * pain    FUNCTIONAL TESTS:  STS - no UE no pain    TODAY'S TREATMENT  05/27/2022 Therapeutic Exercises:  self traction lumbar spine chair,RLTR 3 minutes  Manual: traction of right leg, SIJ position stretch leg off table - tolerated well, traction over ball, STM to lumbar paraspinals and glutes, Cupping to thoracolumbar fascia  Previous interventions Mod fig 4 stretch 30 sec x 2 on R ;  Trial for pelvic tilts, better movement/ability in sitting; Trial for supine march, painful,  TA contraction 3 sec x 10, with education on achieving active contraction (increased radicular pain  with bracing).    Prone:  Seated: Pelvic tilts x 15; sit to stand x 5 - with education on back mechanics Standing: education and practice for bend/squat x 5, pt with question about how to lift laundry basket    Self Care:     PATIENT EDUCATION:  Education details:on current presentation, rationale for DN, cupping, and continued off  loading exercises. On anatomy of lx spine and pelvis  Person educated: Patient Education method: Explanation, Demonstration, and Handouts Education comprehension: verbalized understanding  HOME EXERCISE PROGRAM: MQDAGATE  ASSESSMENT:  CLINICAL IMPRESSION: 05/27/2022  Session focused on manual work and education on this date. Mild relief with cupping but continued pain noted end of session with return to siting. Educated patient on current presentation and next step moving forward. Traction and right lumbar rotation continue wt be most comfortable and pain reliving positions.   Eval: Patient is a 81 y.o. male who was seen today for physical therapy evaluation and treatment for lumbar pain radiating into right leg. Most limitations occur first thing in the morning, and transitional movements after sitting for long period of time. Educated patient on posture and effect prolonged postures has on lumbar spine. Patient would greatly benefit from skilled PT to improve overall function and QOL.   OBJECTIVE IMPAIRMENTS decreased activity tolerance, decreased ROM, decreased strength, impaired perceived functional ability, impaired flexibility, improper body mechanics, postural dysfunction, and pain.   ACTIVITY LIMITATIONS community activity, occupation, yard work, and Office manager .   PERSONAL FACTORS Age, Fitness, and 1 comorbidity: transverse myelytis  are also affecting patient's functional outcome.    REHAB POTENTIAL: Good  CLINICAL DECISION MAKING: Stable/uncomplicated  EVALUATION COMPLEXITY: Low   GOALS: Goals reviewed with patient?  yes  SHORT TERM GOALS:  Patient will be independent in self management strategies to improve quality of life and functional outcomes. Baseline: new program Target date: 06/07/2022 Goal status: MET  2.  Patient will report at least 50% improvement in overall symptoms and/or function to demonstrate improved functional mobility Baseline: 0% Target date:  06/07/2022 Goal status: PROGRESSING  3.  Patient will be able to get up out of chair without use of arms or reported stiffness afterwards Baseline: unable Target date: 06/07/2022 Goal status: MET     LONG TERM GOALS:  Patient will report at least 75% improvement in overall symptoms and/or function to demonstrate improved functional mobility Baseline: 0% Target date: 06/28/2022 Goal status: PROGRESSING  2.  Patient will be able to demonstrate lumbar ROM without radicular symptoms. Baseline: painful Target date: 06/28/2022 Goal status: PROGRESSING  3.  Patient will report returning to golf without difficulties secondary to pain in leg. Baseline: not playing golf Target date: 06/28/2022 Goal status: PROGRESSING     PLAN: PT FREQUENCY: 2x/week  PT DURATION: 6 weeks  PLANNED INTERVENTIONS: Therapeutic exercises, Therapeutic activity, Neuromuscular re-education, Balance training, Gait training, Patient/Family education, Joint mobilization, Dry Needling, Electrical stimulation, Spinal mobilization, Cryotherapy, Moist heat, Traction, Ionotophoresis 39m/ml Dexamethasone, and Manual therapy.  PLAN FOR NEXT SESSION: L1-3 PA light, laying over towel supine - f/uright lumbar rotation, traction, isometrics, ROM, prone mobilizations  12:37 PM, 05/27/22 MJerene Pitch DPT [facility]

## 2022-05-28 NOTE — Telephone Encounter (Signed)
I have ordered a facet injection as we discussed.  Please contact [place] [facility] at [phone] to schedule this injection.

## 2022-05-28 NOTE — Telephone Encounter (Signed)
Left pt a VM informing him the facet injection has been placed and provided the # for [place] to schedule.

## 2022-05-31 ENCOUNTER — Encounter: Payer: Self-pay | Admitting: Physical Therapy

## 2022-05-31 ENCOUNTER — Ambulatory Visit (INDEPENDENT_AMBULATORY_CARE_PROVIDER_SITE_OTHER): Payer: Medicare Other | Admitting: Physical Therapy

## 2022-05-31 DIAGNOSIS — M6281 Muscle weakness (generalized): Secondary | ICD-10-CM

## 2022-05-31 DIAGNOSIS — M5459 Other low back pain: Secondary | ICD-10-CM

## 2022-05-31 DIAGNOSIS — M25551 Pain in right hip: Secondary | ICD-10-CM

## 2022-05-31 NOTE — Therapy (Signed)
OUTPATIENT PHYSICAL THERAPY TREATMENT and RECERT Note     Patient Name: Alec Snyder MRN: [id number] DOB:January 21, 1941, 81 y.o., male Today's Date: 05/31/2022   PT End of Session - 05/31/22 1016     Visit Number 16    Number of Visits 24    Date for PT Re-Evaluation 06/28/22    Authorization Type medicare    Progress Note Due on Visit 10    PT Start Time 3762    PT Stop Time 1056    PT Time Calculation (min) 38 min    Activity Tolerance Patient limited by pain    Behavior During Therapy [facility] for tasks assessed/performed                 Past Medical History:  Diagnosis Date   Arthritis    in hands   Cancer ([place])    melanoma/ on hand and leg   Hemorrhoids    occasional   Hx of adenomatous colonic polyps 06/30/2017   Hyperlipemia    Hypothyroid    Inguinal hernia    bilateral   Joint pain    minor   Transverse myelitis ([place])    Wears glasses    Past Surgical History:  Procedure Laterality Date   BLEPHAROPLASTY Bilateral 2016   COLONOSCOPY     HERNIA REPAIR  2012   BIH   MELANOMA EXCISION     Patient Active Problem List   Diagnosis Date Noted   Aortic valve disorder 03/30/2022   Hardening of the aorta (main artery of the heart) ([place]) 03/30/2022   Elevated PSA 03/30/2022   Localized, primary osteoarthritis of hand 03/30/2022   Transverse myelitis ([place]) 03/30/2022   Rheumatoid arthritis ([place]) 03/30/2022   Other specified health status 03/30/2022   Prediabetes 03/30/2022   Vitamin B12 deficiency (non anemic) 03/30/2022   Exertional dyspnea 04/10/2020   Essential hypertension 06/23/2019   Positive QuantiFERON-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 07/12/2017   Hx of adenomatous colonic polyps 06/30/2017   Hypothyroidism 06/06/2017   Hyperlipidemia 05/14/2014   Inguinal hernia bilateral, non-recurrent 06/14/2011    PCP: Deland Pretty, MD  REFERRING PROVIDER: Lynne Leader   REFERRING DIAG: [phone] (ICD-10-CM) - Chronic right-sided low back pain with right-sided  sciatica M25.551 (ICD-10-CM) - Right hip pain  THERAPY DIAG:  Other low back pain  Pain in right hip  Muscle weakness (generalized)  ONSET DATE: about one month for back pain  SUBJECTIVE:  SUBJECTIVE STATEMENT: Gets new injection and Wednesday. States he has a little bit less pain. States it still hurts but was not as aggravated as last time. States he has still not tried getting into the pool  EvaL: States that he has been having some symptoms in his leg and buttocks. States he saw the MD and he thinks it is a pinched nerve. States that he has felt a little bit better as he has taken the medication. States that the morning is the worse and as he walks it feels better. States that some positions. States that he was having back pain for about a month but it wasn't terrible and then on 03/29/22 he has severe pain in his leg.  States he wants to get back to golf. He notes he has been sitting more than usual  PERTINENT HISTORY:  RA and  transverse myelytis, hx of low back pain  PAIN:  Are you having pain? Yes: NPRS scale: at worse 5/10 Pain location: right buttocks and buttocks  Pain description: sharp  Aggravating factors: sitting down, getting up Relieving factors: medication    PRECAUTIONS: None  WEIGHT BEARING RESTRICTIONS No  FALLS:  Has patient fallen in last 6 months? No   OCCUPATION: investor - part time worker  PLOF: Independent  PATIENT GOALS ikes to play golf but currently unable to   OBJECTIVE:   DIAGNOSTIC FINDINGS:  Xray 03/31/22 IMPRESSION: No recent fracture is seen in the lumbar spine. Lumbar spondylosis with disc space narrowing, bony spurs and facet hypertrophy. There is interval worsening of degenerative changes in the lumbar spine, particularly at L4-L5  level.   SCREENING FOR RED FLAGS: Bowel or bladder incontinence: No Spinal tumors: No Cauda equina syndrome: No Compression fracture: No Abdominal aneurysm: No  COGNITION:  Overall cognitive status: Within functional limits for tasks assessed     SENSATION: WFL   POSTURE:  Sacral sitting, forward head, rounded shoulders.  PALPATION: Tenderness to palpation along lumbar paraspinals   LUMBAR ROM:   Active  A/PROM  05/06/2022  Flexion 75% limited pain down leg  Extension 75%  stretches  Right lateral flexion 75% pain in right leg  Left lateral flexion 50% limited no change  Right rotation   Left rotation    (Blank rows = not tested)     LE Measurements Lower Extremity Right 4/24 Left 4/24   A/PROM MMT A/PROM MMT  Hip Flexion  4-  4  Hip Extension      Hip Abduction      Hip Adduction      Hip Internal rotation      Hip External rotation      Knee Flexion  4-  4  Knee Extension  4-  4  Ankle Dorsiflexion  4  4+  Ankle Plantarflexion      Ankle Inversion      Ankle Eversion       (Blank rows = not tested)  * pain    FUNCTIONAL TESTS:  STS - no UE no pain    TODAY'S TREATMENT  05/31/2022 Therapeutic Exercises:  hip ad iso 3 minutes, hip abd iso 3 minutes, SIJ stretch towards the left and LTR 5 minutes total -repeated all 2-3x   Previous interventions Mod fig 4 stretch 30 sec x 2 on R ;  Trial for pelvic tilts, better movement/ability in sitting; Trial for supine march, painful,  TA contraction 3 sec x 10, with education on achieving active contraction (increased radicular pain with bracing).  Prone:  Seated: Pelvic tilts x 15; sit to stand x 5 - with education on back mechanics Standing: education and practice for bend/squat x 5, pt with question about how to lift laundry basket    Self Care:     PATIENT EDUCATION:  Education details:. On anatomy of lx spine and pelvis and muscles activated with hip exercises Person educated:  Patient Education method: Explanation, Demonstration, and Handouts Education comprehension: verbalized understanding  HOME EXERCISE PROGRAM: MQDAGATE  ASSESSMENT:  CLINICAL IMPRESSION: 05/31/2022 Patient continues to be in a lot of pain. Re-enforced patient trailing pool as he has not tried this. Focused on pelvic motions on this date. Tolerated moderately better. Continued pain end of session. Will f/u with patient post injection next session  Eval: Patient is a 81 y.o. male who was seen today for physical therapy evaluation and treatment for lumbar pain radiating into right leg. Most limitations occur first thing in the morning, and transitional movements after sitting for long period of time. Educated patient on posture and effect prolonged postures has on lumbar spine. Patient would greatly benefit from skilled PT to improve overall function and QOL.   OBJECTIVE IMPAIRMENTS decreased activity tolerance, decreased ROM, decreased strength, impaired perceived functional ability, impaired flexibility, improper body mechanics, postural dysfunction, and pain.   ACTIVITY LIMITATIONS community activity, occupation, yard work, and Office manager .   PERSONAL FACTORS Age, Fitness, and 1 comorbidity: transverse myelytis  are also affecting patient's functional outcome.    REHAB POTENTIAL: Good  CLINICAL DECISION MAKING: Stable/uncomplicated  EVALUATION COMPLEXITY: Low   GOALS: Goals reviewed with patient?  yes  SHORT TERM GOALS:  Patient will be independent in self management strategies to improve quality of life and functional outcomes. Baseline: new program Target date: 06/07/2022 Goal status: MET  2.  Patient will report at least 50% improvement in overall symptoms and/or function to demonstrate improved functional mobility Baseline: 0% Target date: 06/07/2022 Goal status: PROGRESSING  3.  Patient will be able to get up out of chair without use of arms or reported stiffness  afterwards Baseline: unable Target date: 06/07/2022 Goal status: MET     LONG TERM GOALS:  Patient will report at least 75% improvement in overall symptoms and/or function to demonstrate improved functional mobility Baseline: 0% Target date: 06/28/2022 Goal status: PROGRESSING  2.  Patient will be able to demonstrate lumbar ROM without radicular symptoms. Baseline: painful Target date: 06/28/2022 Goal status: PROGRESSING  3.  Patient will report returning to golf without difficulties secondary to pain in leg. Baseline: not playing golf Target date: 06/28/2022 Goal status: PROGRESSING     PLAN: PT FREQUENCY: 2x/week  PT DURATION: 6 weeks  PLANNED INTERVENTIONS: Therapeutic exercises, Therapeutic activity, Neuromuscular re-education, Balance training, Gait training, Patient/Family education, Joint mobilization, Dry Needling, Electrical stimulation, Spinal mobilization, Cryotherapy, Moist heat, Traction, Ionotophoresis 74m/ml Dexamethasone, and Manual therapy.  PLAN FOR NEXT SESSION: L1-3 PA light, laying over towel supine - f/uright lumbar rotation, traction, isometrics, ROM, prone mobilizations  11:00 AM, 05/31/22 MJerene Pitch DPT Physical Therapy with [facility]

## 2022-06-02 ENCOUNTER — Ambulatory Visit
Admission: RE | Admit: 2022-06-02 | Discharge: 2022-06-02 | Disposition: A | Discharge status: Home / Self Care | Payer: Medicare Other | Source: Ambulatory Visit | Attending: Family Medicine | Admitting: Family Medicine

## 2022-06-02 DIAGNOSIS — G8929 Other chronic pain: Secondary | ICD-10-CM

## 2022-06-02 DIAGNOSIS — M47816 Spondylosis without myelopathy or radiculopathy, lumbar region: Secondary | ICD-10-CM | POA: Diagnosis not present

## 2022-06-02 DIAGNOSIS — M545 Low back pain, unspecified: Secondary | ICD-10-CM | POA: Diagnosis not present

## 2022-06-02 MED ORDER — IOPAMIDOL (ISOVUE-M 200) INJECTION 41%
1.0000 mL | Freq: Once | INTRAMUSCULAR | Status: AC
  Administered 2022-06-02: 1 mL via EPIDURAL

## 2022-06-02 MED ORDER — METHYLPREDNISOLONE ACETATE 40 MG/ML INJ SUSP (RADIOLOG
80.0000 mg | Freq: Once | INTRAMUSCULAR | Status: AC
  Administered 2022-06-02: 80 mg via EPIDURAL

## 2022-06-02 NOTE — Discharge Instructions (Signed)
Post Procedure Spinal Discharge Instruction Sheet  You may resume a regular diet and any medications that you routinely take (including pain medications) unless otherwise noted by MD.  No driving day of procedure.  Light activity throughout the rest of the day.  Do not do any strenuous work, exercise, bending or lifting.  The day following the procedure, you can resume normal physical activity but you should refrain from exercising or physical therapy for at least three days thereafter.  You may apply ice to the injection site, 20 minutes on, 20 minutes off, as needed. Do not apply ice directly to skin.    Common Side Effects:  Headaches- take your usual medications as directed by your physician.  Increase your fluid intake.  Caffeinated beverages may be helpful.  Lie flat in bed until your headache resolves.  Restlessness or inability to sleep- you may have trouble sleeping for the next few days.  Ask your referring physician if you need any medication for sleep.  Facial flushing or redness- should subside within a few days.  Increased pain- a temporary increase in pain a day or two following your procedure is not unusual.  Take your pain medication as prescribed by your referring physician.  Leg cramps  Please contact our office at [phone] for the following symptoms: Fever greater than 100 degrees. Headaches unresolved with medication after 2-3 days. Increased swelling, pain, or redness at injection site.   Thank you for visiting [facility] today.    YOU MAY RESUME YOUR ASPIRIN ANYTIME AFTER YOUR PROCEDURE TODAY

## 2022-06-03 ENCOUNTER — Encounter: Payer: Medicare Other | Admitting: Physical Therapy

## 2022-06-07 ENCOUNTER — Ambulatory Visit (INDEPENDENT_AMBULATORY_CARE_PROVIDER_SITE_OTHER): Payer: Medicare Other | Admitting: Physical Therapy

## 2022-06-07 ENCOUNTER — Encounter: Payer: Self-pay | Admitting: Physical Therapy

## 2022-06-07 DIAGNOSIS — M5459 Other low back pain: Secondary | ICD-10-CM

## 2022-06-07 DIAGNOSIS — M6281 Muscle weakness (generalized): Secondary | ICD-10-CM

## 2022-06-07 DIAGNOSIS — M25551 Pain in right hip: Secondary | ICD-10-CM

## 2022-06-07 NOTE — Therapy (Signed)
OUTPATIENT PHYSICAL THERAPY TREATMENT and RECERT Note     Patient Name: Alec Snyder MRN: [id number] DOB:01-04-1941, 81 y.o., male Today's Date: 06/07/2022   PT End of Session - 06/07/22 1100     Visit Number 17    Number of Visits 24    Date for PT Re-Evaluation 06/28/22    Authorization Type medicare    Progress Note Due on Visit 20    PT Start Time 1100    PT Stop Time 1140    PT Time Calculation (min) 40 min    Activity Tolerance Patient limited by pain    Behavior During Therapy [facility] for tasks assessed/performed                 Past Medical History:  Diagnosis Date   Arthritis    in hands   Cancer [facility])    melanoma/ on hand and leg   Hemorrhoids    occasional   Hx of adenomatous colonic polyps 06/30/2017   Hyperlipemia    Hypothyroid    Inguinal hernia    bilateral   Joint pain    minor   Transverse myelitis (HCC)    Wears glasses    Past Surgical History:  Procedure Laterality Date   BLEPHAROPLASTY Bilateral 2016   COLONOSCOPY     HERNIA REPAIR  2012   BIH   MELANOMA EXCISION     Patient Active Problem List   Diagnosis Date Noted   Aortic valve disorder 03/30/2022   Hardening of the aorta (main artery of the heart) (HCC) 03/30/2022   Elevated PSA 03/30/2022   Localized, primary osteoarthritis of hand 03/30/2022   Transverse myelitis (HCC) 03/30/2022   Rheumatoid arthritis (HCC) 03/30/2022   Other specified health status 03/30/2022   Prediabetes 03/30/2022   Vitamin B12 deficiency (non anemic) 03/30/2022   Exertional dyspnea 04/10/2020   Essential hypertension 06/23/2019   Positive QuantiFERON-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 07/12/2017   Hx of adenomatous colonic polyps 06/30/2017   Hypothyroidism 06/06/2017   Hyperlipidemia 05/14/2014   Inguinal hernia bilateral, non-recurrent 06/14/2011    PCP: Merri Brunette, MD  REFERRING PROVIDER: Clementeen Graham   REFERRING DIAG: [id number] (ICD-10-CM) - Chronic right-sided low back pain with right-sided  sciatica M25.551 (ICD-10-CM) - Right hip pain  THERAPY DIAG:  Other low back pain  Pain in right hip  Muscle weakness (generalized)  ONSET DATE: about one month for back pain  SUBJECTIVE:  SUBJECTIVE STATEMENT: States he had his injection and feels a little bit better but still bad. States he didn't have a great weekend but this  morning it feels on the edge of wanting to get better.   EvaL: States that he has been having some symptoms in his leg and buttocks. States he saw the MD and he thinks it is a pinched nerve. States that he has felt a little bit better as he has taken the medication. States that the morning is the worse and as he walks it feels better. States that some positions. States that he was having back pain for about a month but it wasn't terrible and then on 03/29/22 he has severe pain in his leg.  States he wants to get back to golf. He notes he has been sitting more than usual  PERTINENT HISTORY:  RA and  transverse myelytis, hx of low back pain  PAIN:  Are you having pain? Yes: NPRS scale: at worse 5/10 Pain location: right buttocks and buttocks  Pain description: sharp  Aggravating factors: sitting down, getting up Relieving factors: medication    PRECAUTIONS: None  WEIGHT BEARING RESTRICTIONS No  FALLS:  Has patient fallen in last 6 months? No   OCCUPATION: investor - part time worker  PLOF: Independent  PATIENT GOALS ikes to play golf but currently unable to   OBJECTIVE:   DIAGNOSTIC FINDINGS:  Xray 03/31/22 IMPRESSION: No recent fracture is seen in the lumbar spine. Lumbar spondylosis with disc space narrowing, bony spurs and facet hypertrophy. There is interval worsening of degenerative changes in the lumbar spine, particularly at L4-L5 level.   SCREENING  FOR RED FLAGS: Bowel or bladder incontinence: No Spinal tumors: No Cauda equina syndrome: No Compression fracture: No Abdominal aneurysm: No  COGNITION:  Overall cognitive status: Within functional limits for tasks assessed     SENSATION: WFL   POSTURE:  Sacral sitting, forward head, rounded shoulders.  PALPATION: Tenderness to palpation along lumbar paraspinals   LUMBAR ROM:   Active  A/PROM  05/06/2022  Flexion 75% limited pain down leg  Extension 75%  stretches  Right lateral flexion 75% pain in right leg  Left lateral flexion 50% limited no change  Right rotation   Left rotation    (Blank rows = not tested)     LE Measurements Lower Extremity Right 4/24 Left 4/24   A/PROM MMT A/PROM MMT  Hip Flexion  4-  4  Hip Extension      Hip Abduction      Hip Adduction      Hip Internal rotation      Hip External rotation      Knee Flexion  4-  4  Knee Extension  4-  4  Ankle Dorsiflexion  4  4+  Ankle Plantarflexion      Ankle Inversion      Ankle Eversion       (Blank rows = not tested)  * pain    FUNCTIONAL TESTS:  STS - no UE no pain    TODAY'S TREATMENT  06/07/2022 Therapeutic Exercises:  right hip add/flexion stretch, then flexion with strap R x15, tiny heel slide up opposite leg 3 minutes with strap  Bridges - not tolerated, hip ROM - not tolerated   Previous interventions Mod fig 4 stretch 30 sec x 2 on R ;  Trial for pelvic tilts, better movement/ability in sitting; Trial for supine march, painful,  TA contraction 3 sec x 10, with education on  achieving active contraction (increased radicular pain with bracing).    Prone:  Seated: Pelvic tilts x 15; sit to stand x 5 - with education on back mechanics Standing: education and practice for bend/squat x 5, pt with question about how to lift laundry basket    Self Care:     PATIENT EDUCATION:  Education details:. Chiro, current presentation, on traction machine, on current concerns Person  educated: Patient Education method: Explanation, Demonstration, and Handouts Education comprehension: verbalized understanding  HOME EXERCISE PROGRAM: MQDAGATE  ASSESSMENT:  CLINICAL IMPRESSION: 06/07/2022   Patient with slight improvement but continued pain. Patient with concerns and questions about next step and possible benefits of chiropractor. Answered all questions dn patient to be placed on hold secondary to wanting to try chiro at this time. Will discharge if patient does not return by end of July.  Eval: Patient is a 81 y.o. male who was seen today for physical therapy evaluation and treatment for lumbar pain radiating into right leg. Most limitations occur first thing in the morning, and transitional movements after sitting for long period of time. Educated patient on posture and effect prolonged postures has on lumbar spine. Patient would greatly benefit from skilled PT to improve overall function and QOL.   OBJECTIVE IMPAIRMENTS decreased activity tolerance, decreased ROM, decreased strength, impaired perceived functional ability, impaired flexibility, improper body mechanics, postural dysfunction, and pain.   ACTIVITY LIMITATIONS community activity, occupation, yard work, and Systems analyst .   PERSONAL FACTORS Age, Fitness, and 1 comorbidity: transverse myelytis  are also affecting patient's functional outcome.    REHAB POTENTIAL: Good  CLINICAL DECISION MAKING: Stable/uncomplicated  EVALUATION COMPLEXITY: Low   GOALS: Goals reviewed with patient?  yes  SHORT TERM GOALS:  Patient will be independent in self management strategies to improve quality of life and functional outcomes. Baseline: new program Target date: 06/07/2022 Goal status: MET  2.  Patient will report at least 50% improvement in overall symptoms and/or function to demonstrate improved functional mobility Baseline: 0% Target date: 06/07/2022 Goal status: PROGRESSING  3.  Patient will be able to get up out  of chair without use of arms or reported stiffness afterwards Baseline: unable Target date: 06/07/2022 Goal status: MET     LONG TERM GOALS:  Patient will report at least 75% improvement in overall symptoms and/or function to demonstrate improved functional mobility Baseline: 0% Target date: 06/28/2022 Goal status: PROGRESSING  2.  Patient will be able to demonstrate lumbar ROM without radicular symptoms. Baseline: painful Target date: 06/28/2022 Goal status: PROGRESSING  3.  Patient will report returning to golf without difficulties secondary to pain in leg. Baseline: not playing golf Target date: 06/28/2022 Goal status: PROGRESSING     PLAN: PT FREQUENCY: 2x/week  PT DURATION: 6 weeks  PLANNED INTERVENTIONS: Therapeutic exercises, Therapeutic activity, Neuromuscular re-education, Balance training, Gait training, Patient/Family education, Joint mobilization, Dry Needling, Electrical stimulation, Spinal mobilization, Cryotherapy, Moist heat, Traction, Ionotophoresis 4mg /ml Dexamethasone, and Manual therapy.  PLAN FOR NEXT SESSION: on hold   12:03 PM, 06/07/22 Tereasa Coop, DPT Physical Therapy with [facility]

## 2022-06-09 DIAGNOSIS — M5431 Sciatica, right side: Secondary | ICD-10-CM | POA: Diagnosis not present

## 2022-06-09 DIAGNOSIS — Z8661 Personal history of infections of the central nervous system: Secondary | ICD-10-CM | POA: Diagnosis not present

## 2022-06-09 DIAGNOSIS — Z6824 Body mass index (BMI) 24.0-24.9, adult: Secondary | ICD-10-CM | POA: Diagnosis not present

## 2022-06-09 DIAGNOSIS — M48061 Spinal stenosis, lumbar region without neurogenic claudication: Secondary | ICD-10-CM | POA: Diagnosis not present

## 2022-06-09 DIAGNOSIS — M5417 Radiculopathy, lumbosacral region: Secondary | ICD-10-CM | POA: Diagnosis not present

## 2022-06-09 DIAGNOSIS — M5416 Radiculopathy, lumbar region: Secondary | ICD-10-CM | POA: Diagnosis not present

## 2022-06-09 DIAGNOSIS — R7989 Other specified abnormal findings of blood chemistry: Secondary | ICD-10-CM | POA: Diagnosis not present

## 2022-06-10 ENCOUNTER — Encounter: Payer: Medicare Other | Admitting: Physical Therapy

## 2022-06-10 DIAGNOSIS — H57813 Brow ptosis, bilateral: Secondary | ICD-10-CM | POA: Diagnosis not present

## 2022-06-10 DIAGNOSIS — H02413 Mechanical ptosis of bilateral eyelids: Secondary | ICD-10-CM | POA: Diagnosis not present

## 2022-06-10 DIAGNOSIS — H02032 Senile entropion of right lower eyelid: Secondary | ICD-10-CM | POA: Diagnosis not present

## 2022-06-10 DIAGNOSIS — H02052 Trichiasis without entropian right lower eyelid: Secondary | ICD-10-CM | POA: Diagnosis not present

## 2022-06-10 DIAGNOSIS — D23112 Other benign neoplasm of skin of right lower eyelid, including canthus: Secondary | ICD-10-CM | POA: Diagnosis not present

## 2022-06-10 DIAGNOSIS — L821 Other seborrheic keratosis: Secondary | ICD-10-CM | POA: Diagnosis not present

## 2022-06-10 DIAGNOSIS — H02834 Dermatochalasis of left upper eyelid: Secondary | ICD-10-CM | POA: Diagnosis not present

## 2022-06-10 DIAGNOSIS — H02535 Eyelid retraction left lower eyelid: Secondary | ICD-10-CM | POA: Diagnosis not present

## 2022-06-10 DIAGNOSIS — H02042 Spastic entropion of right lower eyelid: Secondary | ICD-10-CM | POA: Diagnosis not present

## 2022-06-10 DIAGNOSIS — H02831 Dermatochalasis of right upper eyelid: Secondary | ICD-10-CM | POA: Diagnosis not present

## 2022-06-10 DIAGNOSIS — H02532 Eyelid retraction right lower eyelid: Secondary | ICD-10-CM | POA: Diagnosis not present

## 2022-06-10 DIAGNOSIS — H02423 Myogenic ptosis of bilateral eyelids: Secondary | ICD-10-CM | POA: Diagnosis not present

## 2022-06-14 DIAGNOSIS — Z7982 Long term (current) use of aspirin: Secondary | ICD-10-CM | POA: Diagnosis not present

## 2022-06-14 DIAGNOSIS — Z6823 Body mass index (BMI) 23.0-23.9, adult: Secondary | ICD-10-CM | POA: Diagnosis not present

## 2022-06-14 DIAGNOSIS — M5137 Other intervertebral disc degeneration, lumbosacral region: Secondary | ICD-10-CM | POA: Diagnosis not present

## 2022-06-14 DIAGNOSIS — M47816 Spondylosis without myelopathy or radiculopathy, lumbar region: Secondary | ICD-10-CM | POA: Diagnosis not present

## 2022-06-14 DIAGNOSIS — E079 Disorder of thyroid, unspecified: Secondary | ICD-10-CM | POA: Diagnosis not present

## 2022-06-14 DIAGNOSIS — I2510 Atherosclerotic heart disease of native coronary artery without angina pectoris: Secondary | ICD-10-CM | POA: Diagnosis not present

## 2022-06-14 DIAGNOSIS — M5126 Other intervertebral disc displacement, lumbar region: Secondary | ICD-10-CM | POA: Diagnosis not present

## 2022-06-14 DIAGNOSIS — I129 Hypertensive chronic kidney disease with stage 1 through stage 4 chronic kidney disease, or unspecified chronic kidney disease: Secondary | ICD-10-CM | POA: Diagnosis not present

## 2022-06-14 DIAGNOSIS — G373 Acute transverse myelitis in demyelinating disease of central nervous system: Secondary | ICD-10-CM | POA: Diagnosis not present

## 2022-06-14 DIAGNOSIS — N183 Chronic kidney disease, stage 3 unspecified: Secondary | ICD-10-CM | POA: Diagnosis not present

## 2022-06-14 DIAGNOSIS — M4857XA Collapsed vertebra, not elsewhere classified, lumbosacral region, initial encounter for fracture: Secondary | ICD-10-CM | POA: Diagnosis not present

## 2022-06-14 DIAGNOSIS — M4856XA Collapsed vertebra, not elsewhere classified, lumbar region, initial encounter for fracture: Secondary | ICD-10-CM | POA: Diagnosis not present

## 2022-06-14 DIAGNOSIS — M47817 Spondylosis without myelopathy or radiculopathy, lumbosacral region: Secondary | ICD-10-CM | POA: Diagnosis not present

## 2022-06-14 DIAGNOSIS — Z79890 Hormone replacement therapy (postmenopausal): Secondary | ICD-10-CM | POA: Diagnosis not present

## 2022-06-14 DIAGNOSIS — M48061 Spinal stenosis, lumbar region without neurogenic claudication: Secondary | ICD-10-CM | POA: Diagnosis not present

## 2022-06-14 DIAGNOSIS — Z87891 Personal history of nicotine dependence: Secondary | ICD-10-CM | POA: Diagnosis not present

## 2022-06-16 ENCOUNTER — Telehealth: Payer: Self-pay | Admitting: Family Medicine

## 2022-06-16 ENCOUNTER — Telehealth: Payer: Self-pay

## 2022-06-16 NOTE — Telephone Encounter (Signed)
Pt was called back and he expressed that he had 2 different visits with providers in [facility] and he wanted to discuss with [name] the treatment plan moving forward since [name] is his "quarterback." Pt scheduled for 7/13.

## 2022-06-16 NOTE — Telephone Encounter (Signed)
Patient called asking to speak to someone to discuss some recent visits that he has had since seeing Dr Georgina Snell. He asked if someone could call him back to give updates to Dr Georgina Snell.  Please advise.

## 2022-06-24 ENCOUNTER — Ambulatory Visit (INDEPENDENT_AMBULATORY_CARE_PROVIDER_SITE_OTHER): Payer: Medicare Other | Admitting: Family Medicine

## 2022-06-24 VITALS — BP 138/72 | HR 70 | Ht 68.0 in | Wt 154.8 lb

## 2022-06-24 DIAGNOSIS — M5416 Radiculopathy, lumbar region: Secondary | ICD-10-CM | POA: Diagnosis not present

## 2022-06-24 NOTE — Patient Instructions (Addendum)
Thank you for coming in today.   Please call [facility] at [phone] to schedule your spine injection.    You can get that back injection when it makes sense. The target is at the L3 nerve.   Recheck with me in 6 weeks.

## 2022-06-24 NOTE — Progress Notes (Signed)
I, Peterson Lombard, LAT, ATC acting as a scribe for Lynne Leader, MD.  Alec Snyder is a 81 y.o. male who presents to [place] at [facility] today for a status update of his treatment plan for his R hip pain and lumbar radiculopathy after several referral visits. Pt was last seen by [name] on 05/25/22 and was given a R hip posterior iliac crest just lateral to the SI joint steroid injection. Pt had no relief from this injection and was referred for a facet injection that was performed on 06/02/22. Pt's last ESI was performed on 05/13/22. Pt had a visit with [facility] and [facility] on 06/09/22. Today, pt reports a tiny bit of improvement and he was able to get out of the car without pain. Pt reports that his pain is now mostly in his right anterior thigh.  Dx imaging: 05/07/22 R hip and L-spine MRI             03/30/22 L-spine XR             07/20/17 T-spine MRI             07/22/16 T-spine MRI             06/03/16 T-spine MRI             03/08/09 L-spine MRI  Pertinent review of systems: No fevers or chills  Relevant historical information: History of transverse myelitis.   Exam:  BP 138/72   Pulse 70   Ht '5\' 8"'$  (1.727 m)   Wt 154 lb 12.8 oz (70.2 kg)   SpO2 96%   BMI 23.54 kg/m  General: Well Developed, well nourished, and in no acute distress.   MSK: L-spine: Nontender midline normal lumbar motion. Intact lower extremity strength.    Lab and Radiology Results EXAM: MRI LUMBAR SPINE WITHOUT CONTRAST   TECHNIQUE: Multiplanar, multisequence MR imaging of the lumbar spine was performed. No intravenous contrast was administered.   COMPARISON:  No recent comparison   FINDINGS: Segmentation:  Standard.   Alignment:  No significant listhesis.   Vertebrae: Degenerative endplate irregularity, greatest from L3-L4 to L5-S1. There is likely degenerative marrow edema at the right superior L4 endplate extending into the right pedicle.   Conus  medullaris and cauda equina: Conus extends to the T12-L1 level. Conus and cauda equina appear normal.   Paraspinal and other soft tissues: Unremarkable.   Disc levels:   L1-L2:  No canal or foraminal stenosis.   L2-L3: Disc bulge slightly eccentric to the left. Mild facet arthropathy. Minor canal stenosis. Slight effacement of subarticular recesses. Minor right and mild left foraminal stenosis.   L3-L4: Disc bulge with superimposed right central/subarticular extrusion extending below disc level. Moderate facet arthropathy with ligamentum flavum infolding. Marked canal stenosis. Effacement of the right greater than left subarticular recesses. Mild to moderate right and mild left foraminal stenosis.   L4-L5: Marked disc space narrowing. Disc bulge with endplate osteophytic ridging. Moderate facet arthropathy with ligamentum flavum infolding. Mild canal stenosis. Partial effacement of the subarticular recesses. Mild to moderate right and mild left foraminal stenosis.   L5-S1: Marked disc space narrowing. Disc bulge with endplate osteophytic ridging. Mild facet arthropathy. No canal or foraminal stenosis.   IMPRESSION: Multilevel degenerative changes as detailed above. Most notably, there is marked canal stenosis with effacement of right greater than left subarticular recesses at L3-L4. This is predominantly due to a disc extrusion and there is compression of  the traversing right L4 nerve roots.     Electronically Signed   By: Macy Mis M.D.   On: 05/11/2022 08:49 I, Lynne Leader, personally (independently) visualized and performed the interpretation of the images attached in this note.      Assessment and Plan: 81 y.o. male with right lumbar radiculopathy.  Initially the lumbar radiculopathy was more consistent with L4.  That improved after an epidural steroid injection targeting the right L4.  Then he had more pain in his back that improved with a targeted injection at  facet joint.  Now his pain is more located into the anterior thigh which is more consistent with right L3.  We discussed his options.  We will proceed to L3 epidural steroid injection when he is ready to proceed 1.  The order is in and he can schedule whenever he would like.  Check back in 6 weeks.   PDMP not reviewed this encounter. Orders Placed This Encounter  Procedures   DG INJECT DIAG/THERA/INC NEEDLE/CATH/PLC EPI/LUMB/SAC W/IMG    LUMB EPI #2 RT L3/4  MCR/BCBS-no auth req x's 2//js  PACS (05/07/22) 159 LBS *ASA '81MG'$ -PT AWARE TO STOP 3 DAYS *NO NEEDS* PT AWARE NO SHOW FEE    Standing Status:   Future    Standing Expiration Date:   06/25/2023    Order Specific Question:   Reason for Exam (SYMPTOM  OR DIAGNOSIS REQUIRED)    Answer:   ESI Rt L3/4 to address L3. Technique and Level per radiology    Order Specific Question:   Preferred Imaging Location?    Answer:   [id number]    Order Specific Question:   Radiology Contrast Protocol - do NOT remove file path    Answer:   [email or web address]   No orders of the defined types were placed in this encounter.    Discussed warning signs or symptoms. Please see discharge instructions. Patient expresses understanding.   The above documentation has been reviewed and is accurate and complete Lynne Leader, M.D.

## 2022-06-26 ENCOUNTER — Other Ambulatory Visit: Payer: Self-pay | Admitting: Family Medicine

## 2022-07-06 ENCOUNTER — Other Ambulatory Visit: Payer: Self-pay | Admitting: Family Medicine

## 2022-07-06 MED ORDER — GABAPENTIN 100 MG PO CAPS: 100.0000 mg | ORAL_CAPSULE | Freq: Three times a day (TID) | ORAL | 3 refills | Status: DC | PRN

## 2022-07-12 ENCOUNTER — Ambulatory Visit (INDEPENDENT_AMBULATORY_CARE_PROVIDER_SITE_OTHER): Payer: Medicare Other | Admitting: Physical Therapy

## 2022-07-12 ENCOUNTER — Encounter: Payer: Self-pay | Admitting: Physical Therapy

## 2022-07-12 DIAGNOSIS — M5459 Other low back pain: Secondary | ICD-10-CM | POA: Diagnosis not present

## 2022-07-12 DIAGNOSIS — H02042 Spastic entropion of right lower eyelid: Secondary | ICD-10-CM | POA: Diagnosis not present

## 2022-07-12 DIAGNOSIS — M25551 Pain in right hip: Secondary | ICD-10-CM | POA: Diagnosis not present

## 2022-07-12 DIAGNOSIS — H02532 Eyelid retraction right lower eyelid: Secondary | ICD-10-CM | POA: Diagnosis not present

## 2022-07-12 DIAGNOSIS — H02413 Mechanical ptosis of bilateral eyelids: Secondary | ICD-10-CM | POA: Diagnosis not present

## 2022-07-12 DIAGNOSIS — H02535 Eyelid retraction left lower eyelid: Secondary | ICD-10-CM | POA: Diagnosis not present

## 2022-07-12 DIAGNOSIS — H02834 Dermatochalasis of left upper eyelid: Secondary | ICD-10-CM | POA: Diagnosis not present

## 2022-07-12 DIAGNOSIS — H02032 Senile entropion of right lower eyelid: Secondary | ICD-10-CM | POA: Diagnosis not present

## 2022-07-12 DIAGNOSIS — H02423 Myogenic ptosis of bilateral eyelids: Secondary | ICD-10-CM | POA: Diagnosis not present

## 2022-07-12 DIAGNOSIS — M6281 Muscle weakness (generalized): Secondary | ICD-10-CM | POA: Diagnosis not present

## 2022-07-12 DIAGNOSIS — L821 Other seborrheic keratosis: Secondary | ICD-10-CM | POA: Diagnosis not present

## 2022-07-12 DIAGNOSIS — H02831 Dermatochalasis of right upper eyelid: Secondary | ICD-10-CM | POA: Diagnosis not present

## 2022-07-12 DIAGNOSIS — H57813 Brow ptosis, bilateral: Secondary | ICD-10-CM | POA: Diagnosis not present

## 2022-07-12 DIAGNOSIS — D23112 Other benign neoplasm of skin of right lower eyelid, including canthus: Secondary | ICD-10-CM | POA: Diagnosis not present

## 2022-07-12 DIAGNOSIS — H02052 Trichiasis without entropian right lower eyelid: Secondary | ICD-10-CM | POA: Diagnosis not present

## 2022-07-12 NOTE — Therapy (Signed)
OUTPATIENT PHYSICAL THERAPY TREATMENT and RECERT Note     Patient Name: Alec Snyder MRN: [id number] DOB:November 15, 1941, 81 y.o., male Today's Date: 07/12/2022   PT End of Session - 07/12/22 1602     Visit Number 18    Number of Visits 30    Date for PT Re-Evaluation 08/23/22    Authorization Type medicare    Progress Note Due on Visit 20    PT Start Time 1603    PT Stop Time 9798    PT Time Calculation (min) 40 min    Activity Tolerance Patient limited by pain    Behavior During Therapy [facility] for tasks assessed/performed                 Past Medical History:  Diagnosis Date   Arthritis    in hands   Cancer [facility])    melanoma/ on hand and leg   Hemorrhoids    occasional   Hx of adenomatous colonic polyps 06/30/2017   Hyperlipemia    Hypothyroid    Inguinal hernia    bilateral   Joint pain    minor   Transverse myelitis ([place])    Wears glasses    Past Surgical History:  Procedure Laterality Date   BLEPHAROPLASTY Bilateral 2016   COLONOSCOPY     HERNIA REPAIR  2012   BIH   MELANOMA EXCISION     Patient Active Problem List   Diagnosis Date Noted   Aortic valve disorder 03/30/2022   Hardening of the aorta (main artery of the heart) ([place]) 03/30/2022   Elevated PSA 03/30/2022   Localized, primary osteoarthritis of hand 03/30/2022   Transverse myelitis ([place]) 03/30/2022   Rheumatoid arthritis ([place]) 03/30/2022   Other specified health status 03/30/2022   Prediabetes 03/30/2022   Vitamin B12 deficiency (non anemic) 03/30/2022   Exertional dyspnea 04/10/2020   Essential hypertension 06/23/2019   Positive QuantiFERON-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 07/12/2017   Hx of adenomatous colonic polyps 06/30/2017   Hypothyroidism 06/06/2017   Hyperlipidemia 05/14/2014   Inguinal hernia bilateral, non-recurrent 06/14/2011    PCP: Deland Pretty, MD  REFERRING PROVIDER: Lynne Leader   REFERRING DIAG: [id number] (ICD-10-CM) - Chronic right-sided low back pain with right-sided  sciatica M25.551 (ICD-10-CM) - Right hip pain  THERAPY DIAG:  Other low back pain - Plan: PT plan of care cert/re-cert  Pain in right hip - Plan: PT plan of care cert/re-cert  Muscle weakness (generalized) - Plan: PT plan of care cert/re-cert  ONSET DATE: about one month for back pain  SUBJECTIVE:  SUBJECTIVE STATEMENT: States that he saw a MD in [place] and said his problem was L3 and surgery was a last resort. States he feels better but he has pain. States that he wants to be able to lay on his stomach again.  EvaL: States that he has been having some symptoms in his leg and buttocks. States he saw the MD and he thinks it is a pinched nerve. States that he has felt a little bit better as he has taken the medication. States that the morning is the worse and as he walks it feels better. States that some positions. States that he was having back pain for about a month but it wasn't terrible and then on 03/29/22 he has severe pain in his leg.  States he wants to get back to golf. He notes he has been sitting more than usual  PERTINENT HISTORY:  RA and  transverse myelytis, hx of low back pain  PAIN:  Are you having pain? Yes: NPRS scale: at worse 3/10 Pain location: right buttocks and buttocks  Pain description: sharp  Aggravating factors: sitting down, getting up Relieving factors: medication    PRECAUTIONS: None  WEIGHT BEARING RESTRICTIONS No  FALLS:  Has patient fallen in last 6 months? No   OCCUPATION: investor - part time worker  PLOF: Independent  PATIENT GOALS ikes to play golf but currently unable to   OBJECTIVE:   DIAGNOSTIC FINDINGS:  Xray 03/31/22 IMPRESSION: No recent fracture is seen in the lumbar spine. Lumbar spondylosis with disc space narrowing, bony spurs and facet  hypertrophy. There is interval worsening of degenerative changes in the lumbar spine, particularly at L4-L5 level.   SCREENING FOR RED FLAGS: Bowel or bladder incontinence: No Spinal tumors: No Cauda equina syndrome: No Compression fracture: No Abdominal aneurysm: No  COGNITION:  Overall cognitive status: Within functional limits for tasks assessed     SENSATION: WFL   POSTURE:  Sacral sitting, forward head, rounded shoulders.  PALPATION: Tenderness to palpation along lumbar paraspinals   LUMBAR ROM:   Active  A/PROM  07/12/22  Flexion 75% limited pain down leg  Extension 75%  stretches/tight  Right lateral flexion 75% pain in right leg  Left lateral flexion 50% limited no change  Right rotation   Left rotation    (Blank rows = not tested)     LE Measurements Lower Extremity Right 7/31 Left 7/31   A/PROM MMT A/PROM MMT  Hip Flexion  4  4  Hip Extension      Hip Abduction      Hip Adduction      Hip Internal rotation      Hip External rotation      Knee Flexion  4-  4  Knee Extension  4-  4  Ankle Dorsiflexion  4  4+  Ankle Plantarflexion      Ankle Inversion      Ankle Eversion       (Blank rows = not tested)  * pain    FUNCTIONAL TESTS:  STS - no UE no pain    TODAY'S TREATMENT  07/12/2022 Therapeutic Exercises:  LTR to left 6 minutes, prone lying 10 minutes Manual: lumbar traction 10 minutes, long axis traction of legs 5 minutes B, STM to right glute med in prone  Previous interventions Mod fig 4 stretch 30 sec x 2 on R ;  Trial for pelvic tilts, better movement/ability in sitting; Trial for supine march, painful,  TA contraction 3 sec x  10, with education on achieving active contraction (increased radicular pain with bracing).    Prone:  Seated: Pelvic tilts x 15; sit to stand x 5 - with education on back mechanics Standing: education and practice for bend/squat x 5, pt with question about how to lift laundry basket    Self Care:      PATIENT EDUCATION:  Education details:. On aquatics and exercises to perform in pool Person educated: Patient Education method: Explanation, Demonstration, and Handouts Education comprehension: verbalized understanding  HOME EXERCISE PROGRAM: MQDAGATE  ASSESSMENT:  CLINICAL IMPRESSION: 07/12/2022 Session focused on education and pain management strategies. Patient wanted to follow up with MD prior to continuing PT from last certification, extending cert to focus on pain management strategies and help with improving overall function and QOL.  Eval: Patient is a 81 y.o. male who was seen today for physical therapy evaluation and treatment for lumbar pain radiating into right leg. Most limitations occur first thing in the morning, and transitional movements after sitting for long period of time. Educated patient on posture and effect prolonged postures has on lumbar spine. Patient would greatly benefit from skilled PT to improve overall function and QOL.   OBJECTIVE IMPAIRMENTS decreased activity tolerance, decreased ROM, decreased strength, impaired perceived functional ability, impaired flexibility, improper body mechanics, postural dysfunction, and pain.   ACTIVITY LIMITATIONS community activity, occupation, yard work, and Office manager .   PERSONAL FACTORS Age, Fitness, and 1 comorbidity: transverse myelytis  are also affecting patient's functional outcome.    REHAB POTENTIAL: Good  CLINICAL DECISION MAKING: Stable/uncomplicated  EVALUATION COMPLEXITY: Low   GOALS: Goals reviewed with patient?  yes  SHORT TERM GOALS:  Patient will be independent in self management strategies to improve quality of life and functional outcomes. Baseline: new program Target date: 06/07/2022 Goal status: MET  2.  Patient will report at least 50% improvement in overall symptoms and/or function to demonstrate improved functional mobility Baseline: 0% Target date: 06/07/2022 Goal status:  PROGRESSING  3.  Patient will be able to get up out of chair without use of arms or reported stiffness afterwards Baseline: unable Target date: 06/07/2022 Goal status: MET     LONG TERM GOALS:  Patient will report at least 75% improvement in overall symptoms and/or function to demonstrate improved functional mobility Baseline: 0% Target date: 06/28/2022 Goal status: PROGRESSING  2.  Patient will be able to demonstrate lumbar ROM without radicular symptoms. Baseline: painful Target date: 06/28/2022 Goal status: PROGRESSING  3.  Patient will report returning to golf without difficulties secondary to pain in leg. Baseline: not playing golf Target date: 06/28/2022 Goal status: PROGRESSING     PLAN: PT FREQUENCY: 2x/week  PT DURATION: 6 weeks  PLANNED INTERVENTIONS: Therapeutic exercises, Therapeutic activity, Neuromuscular re-education, Balance training, Gait training, Patient/Family education, Joint mobilization, Dry Needling, Electrical stimulation, Spinal mobilization, Cryotherapy, Moist heat, Traction, Ionotophoresis 40m/ml Dexamethasone, and Manual therapy.  PLAN FOR NEXT SESSION: on hold   4:48 PM, 07/12/22 MJerene Pitch DPT [facility]

## 2022-07-15 DIAGNOSIS — E538 Deficiency of other specified B group vitamins: Secondary | ICD-10-CM | POA: Diagnosis not present

## 2022-07-15 DIAGNOSIS — Z7982 Long term (current) use of aspirin: Secondary | ICD-10-CM | POA: Diagnosis not present

## 2022-07-15 DIAGNOSIS — I10 Essential (primary) hypertension: Secondary | ICD-10-CM | POA: Diagnosis not present

## 2022-07-15 DIAGNOSIS — E559 Vitamin D deficiency, unspecified: Secondary | ICD-10-CM | POA: Diagnosis not present

## 2022-07-15 DIAGNOSIS — E039 Hypothyroidism, unspecified: Secondary | ICD-10-CM | POA: Diagnosis not present

## 2022-07-15 DIAGNOSIS — R972 Elevated prostate specific antigen [PSA]: Secondary | ICD-10-CM | POA: Diagnosis not present

## 2022-07-15 DIAGNOSIS — E7800 Pure hypercholesterolemia, unspecified: Secondary | ICD-10-CM | POA: Diagnosis not present

## 2022-07-15 DIAGNOSIS — N1831 Chronic kidney disease, stage 3a: Secondary | ICD-10-CM | POA: Diagnosis not present

## 2022-07-15 DIAGNOSIS — R7303 Prediabetes: Secondary | ICD-10-CM | POA: Diagnosis not present

## 2022-07-19 DIAGNOSIS — M5126 Other intervertebral disc displacement, lumbar region: Secondary | ICD-10-CM | POA: Diagnosis not present

## 2022-07-19 DIAGNOSIS — Z6824 Body mass index (BMI) 24.0-24.9, adult: Secondary | ICD-10-CM | POA: Diagnosis not present

## 2022-07-19 DIAGNOSIS — M48061 Spinal stenosis, lumbar region without neurogenic claudication: Secondary | ICD-10-CM | POA: Diagnosis not present

## 2022-07-19 DIAGNOSIS — M5416 Radiculopathy, lumbar region: Secondary | ICD-10-CM | POA: Diagnosis not present

## 2022-07-20 ENCOUNTER — Encounter: Payer: Self-pay | Admitting: Physical Therapy

## 2022-07-20 ENCOUNTER — Ambulatory Visit (INDEPENDENT_AMBULATORY_CARE_PROVIDER_SITE_OTHER): Payer: Medicare Other | Admitting: Physical Therapy

## 2022-07-20 DIAGNOSIS — Z01818 Encounter for other preprocedural examination: Secondary | ICD-10-CM | POA: Diagnosis not present

## 2022-07-20 DIAGNOSIS — M5431 Sciatica, right side: Secondary | ICD-10-CM | POA: Diagnosis not present

## 2022-07-20 DIAGNOSIS — M25551 Pain in right hip: Secondary | ICD-10-CM | POA: Diagnosis not present

## 2022-07-20 DIAGNOSIS — G373 Acute transverse myelitis in demyelinating disease of central nervous system: Secondary | ICD-10-CM | POA: Diagnosis not present

## 2022-07-20 DIAGNOSIS — M5459 Other low back pain: Secondary | ICD-10-CM | POA: Diagnosis not present

## 2022-07-20 DIAGNOSIS — Z0000 Encounter for general adult medical examination without abnormal findings: Secondary | ICD-10-CM | POA: Diagnosis not present

## 2022-07-20 DIAGNOSIS — M0609 Rheumatoid arthritis without rheumatoid factor, multiple sites: Secondary | ICD-10-CM | POA: Diagnosis not present

## 2022-07-20 DIAGNOSIS — E039 Hypothyroidism, unspecified: Secondary | ICD-10-CM | POA: Diagnosis not present

## 2022-07-20 DIAGNOSIS — Z1212 Encounter for screening for malignant neoplasm of rectum: Secondary | ICD-10-CM | POA: Diagnosis not present

## 2022-07-20 DIAGNOSIS — M6281 Muscle weakness (generalized): Secondary | ICD-10-CM

## 2022-07-20 DIAGNOSIS — I2510 Atherosclerotic heart disease of native coronary artery without angina pectoris: Secondary | ICD-10-CM | POA: Diagnosis not present

## 2022-07-20 DIAGNOSIS — R7303 Prediabetes: Secondary | ICD-10-CM | POA: Diagnosis not present

## 2022-07-20 DIAGNOSIS — Z23 Encounter for immunization: Secondary | ICD-10-CM | POA: Diagnosis not present

## 2022-07-20 DIAGNOSIS — N529 Male erectile dysfunction, unspecified: Secondary | ICD-10-CM | POA: Diagnosis not present

## 2022-07-20 DIAGNOSIS — E538 Deficiency of other specified B group vitamins: Secondary | ICD-10-CM | POA: Diagnosis not present

## 2022-07-20 DIAGNOSIS — I10 Essential (primary) hypertension: Secondary | ICD-10-CM | POA: Diagnosis not present

## 2022-07-20 NOTE — Therapy (Signed)
OUTPATIENT PHYSICAL THERAPY TREATMENT      Patient Name: Alec Snyder MRN: [id number] DOB:Dec 20, 1940, 81 y.o., male Today's Date: 07/20/2022   PT End of Session - 07/20/22 1558     Visit Number 19    Number of Visits 30    Date for PT Re-Evaluation 08/23/22    Authorization Type medicare    Progress Note Due on Visit 28    PT Start Time 0300    PT Stop Time 1642    PT Time Calculation (min) 41 min    Activity Tolerance Patient limited by pain    Behavior During Therapy [facility] for tasks assessed/performed                 Past Medical History:  Diagnosis Date   Arthritis    in hands   Cancer ([place])    melanoma/ on hand and leg   Hemorrhoids    occasional   Hx of adenomatous colonic polyps 06/30/2017   Hyperlipemia    Hypothyroid    Inguinal hernia    bilateral   Joint pain    minor   Transverse myelitis ([place])    Wears glasses    Past Surgical History:  Procedure Laterality Date   BLEPHAROPLASTY Bilateral 2016   COLONOSCOPY     HERNIA REPAIR  2012   BIH   MELANOMA EXCISION     Patient Active Problem List   Diagnosis Date Noted   Aortic valve disorder 03/30/2022   Hardening of the aorta (main artery of the heart) ([place]) 03/30/2022   Elevated PSA 03/30/2022   Localized, primary osteoarthritis of hand 03/30/2022   Transverse myelitis ([place]) 03/30/2022   Rheumatoid arthritis ([place]) 03/30/2022   Other specified health status 03/30/2022   Prediabetes 03/30/2022   Vitamin B12 deficiency (non anemic) 03/30/2022   Exertional dyspnea 04/10/2020   Essential hypertension 06/23/2019   Positive QuantiFERON-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 07/12/2017   Hx of adenomatous colonic polyps 06/30/2017   Hypothyroidism 06/06/2017   Hyperlipidemia 05/14/2014   Inguinal hernia bilateral, non-recurrent 06/14/2011    PCP: Deland Pretty, MD  REFERRING PROVIDER: Lynne Leader   REFERRING DIAG: [phone] (ICD-10-CM) - Chronic right-sided low back pain with right-sided sciatica M25.551  (ICD-10-CM) - Right hip pain  THERAPY DIAG:  Other low back pain  Pain in right hip  Muscle weakness (generalized)  ONSET DATE: about one month for back pain  SUBJECTIVE:                                                                                                                                                                                           SUBJECTIVE STATEMENT:  07/20/2022 States he saw MD and recommends injection. State she feels a little better compared to last time he was at PT. States he has pain when he walks but not as much. States he still can't pick anything up off the floor.   EvaL: States that he has been having some symptoms in his leg and buttocks. States he saw the MD and he thinks it is a pinched nerve. States that he has felt a little bit better as he has taken the medication. States that the morning is the worse and as he walks it feels better. States that some positions. States that he was having back pain for about a month but it wasn't terrible and then on 03/29/22 he has severe pain in his leg.  States he wants to get back to golf. He notes he has been sitting more than usual  PERTINENT HISTORY:  RA and  transverse myelytis, hx of low back pain  PAIN:  Are you having pain? Yes: NPRS scale: at worse 5/10 Pain location: right buttocks and buttocks  Pain description: sharp  Aggravating factors: sitting down, getting up Relieving factors: medication    PRECAUTIONS: None  WEIGHT BEARING RESTRICTIONS No  FALLS:  Has patient fallen in last 6 months? No   OCCUPATION: investor - part time worker  PLOF: Independent  PATIENT GOALS ikes to play golf but currently unable to   OBJECTIVE:   DIAGNOSTIC FINDINGS:  Xray 03/31/22 IMPRESSION: No recent fracture is seen in the lumbar spine. Lumbar spondylosis with disc space narrowing, bony spurs and facet hypertrophy. There is interval worsening of degenerative changes in the lumbar spine, particularly  at L4-L5 level.   SCREENING FOR RED FLAGS: Bowel or bladder incontinence: No Spinal tumors: No Cauda equina syndrome: No Compression fracture: No Abdominal aneurysm: No  COGNITION:  Overall cognitive status: Within functional limits for tasks assessed     SENSATION: WFL   POSTURE:  Sacral sitting, forward head, rounded shoulders.  PALPATION: Tenderness to palpation along lumbar paraspinals   LUMBAR ROM:   Active  A/PROM  07/12/22  Flexion 75% limited pain down leg  Extension 75%  stretches/tight  Right lateral flexion 75% pain in right leg  Left lateral flexion 50% limited no change  Right rotation   Left rotation    (Blank rows = not tested)     LE Measurements Lower Extremity Right 7/31 Left 7/31   A/PROM MMT A/PROM MMT  Hip Flexion  4  4  Hip Extension      Hip Abduction      Hip Adduction      Hip Internal rotation      Hip External rotation      Knee Flexion  4-  4  Knee Extension  4-  4  Ankle Dorsiflexion  4  4+  Ankle Plantarflexion      Ankle Inversion      Ankle Eversion       (Blank rows = not tested)  * pain    FUNCTIONAL TESTS:  STS - no UE no pain    TODAY'S TREATMENT  07/20/2022 Therapeutic Exercises:  PROM hip ext and knee flexion stretch 3 minutes B, shoulder flexion up wall traction x15 10" holds, prone lying 5 minutes  Manual: PA to hip grade II/III, STM to glutes and lumbar paraspinals, manual traction to lumbar spine 5 minutes  Previous interventions Mod fig 4 stretch 30 sec x 2 on R ;  Trial for pelvic tilts, better movement/ability  in sitting; Trial for supine march, painful,  TA contraction 3 sec x 10, with education on achieving active contraction (increased radicular pain with bracing).    Prone:  Seated: Pelvic tilts x 15; sit to stand x 5 - with education on back mechanics Standing: education and practice for bend/squat x 5, pt with question about how to lift laundry basket    Self Care:     PATIENT EDUCATION:   Education details:. On dry needling and how it can be beneficial, pros/cons,on traction, on anatomy Person educated: Patient Education method: Explanation, Demonstration, and Handouts Education comprehension: verbalized understanding  HOME EXERCISE PROGRAM: MQDAGATE  ASSESSMENT:  CLINICAL IMPRESSION: 07/20/2022 Did not needle today secondary to no trigger points at glutes. Tolerated manual well and new traction up wall stretch. Educated patient on anatomy and rationale for exercises. Overall reduced pain noted end of session and improved ability to lay prone. Will continue with current POC as tolerated.   Eval: Patient is a 81 y.o. male who was seen today for physical therapy evaluation and treatment for lumbar pain radiating into right leg. Most limitations occur first thing in the morning, and transitional movements after sitting for long period of time. Educated patient on posture and effect prolonged postures has on lumbar spine. Patient would greatly benefit from skilled PT to improve overall function and QOL.   OBJECTIVE IMPAIRMENTS decreased activity tolerance, decreased ROM, decreased strength, impaired perceived functional ability, impaired flexibility, improper body mechanics, postural dysfunction, and pain.   ACTIVITY LIMITATIONS community activity, occupation, yard work, and Office manager .   PERSONAL FACTORS Age, Fitness, and 1 comorbidity: transverse myelytis  are also affecting patient's functional outcome.    REHAB POTENTIAL: Good  CLINICAL DECISION MAKING: Stable/uncomplicated  EVALUATION COMPLEXITY: Low   GOALS: Goals reviewed with patient?  yes  SHORT TERM GOALS:  Patient will be independent in self management strategies to improve quality of life and functional outcomes. Baseline: new program Target date: 06/07/2022 Goal status: MET  2.  Patient will report at least 50% improvement in overall symptoms and/or function to demonstrate improved functional  mobility Baseline: 0% Target date: 06/07/2022 Goal status: PROGRESSING  3.  Patient will be able to get up out of chair without use of arms or reported stiffness afterwards Baseline: unable Target date: 06/07/2022 Goal status: MET     LONG TERM GOALS:  Patient will report at least 75% improvement in overall symptoms and/or function to demonstrate improved functional mobility Baseline: 0% Target date: 06/28/2022 Goal status: PROGRESSING  2.  Patient will be able to demonstrate lumbar ROM without radicular symptoms. Baseline: painful Target date: 06/28/2022 Goal status: PROGRESSING  3.  Patient will report returning to golf without difficulties secondary to pain in leg. Baseline: not playing golf Target date: 06/28/2022 Goal status: PROGRESSING     PLAN: PT FREQUENCY: 2x/week  PT DURATION: 6 weeks  PLANNED INTERVENTIONS: Therapeutic exercises, Therapeutic activity, Neuromuscular re-education, Balance training, Gait training, Patient/Family education, Joint mobilization, Dry Needling, Electrical stimulation, Spinal mobilization, Cryotherapy, Moist heat, Traction, Ionotophoresis 64m/ml Dexamethasone, and Manual therapy.  PLAN FOR NEXT SESSION: on hold   4:44 PM, 07/20/22 MJerene Pitch DPT [facility]

## 2022-07-21 ENCOUNTER — Telehealth: Payer: Self-pay | Admitting: Family Medicine

## 2022-07-21 NOTE — Telephone Encounter (Signed)
Patient called asking if Alec Snyder could call him back. He has questions about the Epidural that was ordered.  Please advise.

## 2022-07-22 ENCOUNTER — Encounter: Payer: Self-pay | Admitting: Physical Therapy

## 2022-07-22 ENCOUNTER — Ambulatory Visit (INDEPENDENT_AMBULATORY_CARE_PROVIDER_SITE_OTHER): Payer: Medicare Other | Admitting: Physical Therapy

## 2022-07-22 DIAGNOSIS — M6281 Muscle weakness (generalized): Secondary | ICD-10-CM | POA: Diagnosis not present

## 2022-07-22 DIAGNOSIS — M25551 Pain in right hip: Secondary | ICD-10-CM

## 2022-07-22 DIAGNOSIS — M5459 Other low back pain: Secondary | ICD-10-CM | POA: Diagnosis not present

## 2022-07-22 NOTE — Telephone Encounter (Signed)
Spoke to pt. He reported he clarified w/ all his providers of the level of the ESI and all is confirmed. Pt notes [place] [facility] had 1 appointment available for Friday, so he is able to get the [facility] done before his eye lid surgery. Pt instructed to keep [place] posted and let us know if he needs anything further.

## 2022-07-22 NOTE — Therapy (Signed)
OUTPATIENT PHYSICAL THERAPY TREATMENT      Patient Name: Alec Snyder MRN: [id number] DOB:Oct 30, 1941, 81 y.o., male Today's Date: 07/22/2022   PT End of Session - 07/22/22 1558     Visit Number 20    Number of Visits 30    Date for PT Re-Evaluation 08/23/22    Authorization Type medicare    Progress Note Due on Visit 28    PT Start Time 1559    PT Stop Time 1638    PT Time Calculation (min) 39 min    Activity Tolerance Patient limited by pain    Behavior During Therapy [facility] for tasks assessed/performed                 Past Medical History:  Diagnosis Date   Arthritis    in hands   Cancer ([place])    melanoma/ on hand and leg   Hemorrhoids    occasional   Hx of adenomatous colonic polyps 06/30/2017   Hyperlipemia    Hypothyroid    Inguinal hernia    bilateral   Joint pain    minor   Transverse myelitis ([place])    Wears glasses    Past Surgical History:  Procedure Laterality Date   BLEPHAROPLASTY Bilateral 2016   COLONOSCOPY     HERNIA REPAIR  2012   BIH   MELANOMA EXCISION     Patient Active Problem List   Diagnosis Date Noted   Aortic valve disorder 03/30/2022   Hardening of the aorta (main artery of the heart) ([place]) 03/30/2022   Elevated PSA 03/30/2022   Localized, primary osteoarthritis of hand 03/30/2022   Transverse myelitis ([place]) 03/30/2022   Rheumatoid arthritis ([place]) 03/30/2022   Other specified health status 03/30/2022   Prediabetes 03/30/2022   Vitamin B12 deficiency (non anemic) 03/30/2022   Exertional dyspnea 04/10/2020   Essential hypertension 06/23/2019   Positive QuantiFERON-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 07/12/2017   Hx of adenomatous colonic polyps 06/30/2017   Hypothyroidism 06/06/2017   Hyperlipidemia 05/14/2014   Inguinal hernia bilateral, non-recurrent 06/14/2011    PCP: Deland Pretty, MD  REFERRING PROVIDER: Lynne Leader   REFERRING DIAG: [phone] (ICD-10-CM) - Chronic right-sided low back pain with right-sided sciatica M25.551  (ICD-10-CM) - Right hip pain  THERAPY DIAG:  Other low back pain  Pain in right hip  Muscle weakness (generalized)  ONSET DATE: about one month for back pain  SUBJECTIVE:                                                                                                                                                                                           SUBJECTIVE STATEMENT:  07/22/2022 States that he is feeling a little better. Getting epidural tomorrow morning. States that he is getting his eye surgery on Monday and and has to take a break from PT.   EvaL: States that he has been having some symptoms in his leg and buttocks. States he saw the MD and he thinks it is a pinched nerve. States that he has felt a little bit better as he has taken the medication. States that the morning is the worse and as he walks it feels better. States that some positions. States that he was having back pain for about a month but it wasn't terrible and then on 03/29/22 he has severe pain in his leg.  States he wants to get back to golf. He notes he has been sitting more than usual  PERTINENT HISTORY:  RA and  transverse myelytis, hx of low back pain  PAIN:  Are you having pain? Yes: NPRS scale: at worse 4.5/10 Pain location: right buttocks and buttocks  Pain description: sharp  Aggravating factors: sitting down, getting up Relieving factors: medication    PRECAUTIONS: None  WEIGHT BEARING RESTRICTIONS No  FALLS:  Has patient fallen in last 6 months? No   OCCUPATION: investor - part time worker  PLOF: Independent  PATIENT GOALS ikes to play golf but currently unable to   OBJECTIVE:   DIAGNOSTIC FINDINGS:  Xray 03/31/22 IMPRESSION: No recent fracture is seen in the lumbar spine. Lumbar spondylosis with disc space narrowing, bony spurs and facet hypertrophy. There is interval worsening of degenerative changes in the lumbar spine, particularly at L4-L5 level.   SCREENING FOR RED  FLAGS: Bowel or bladder incontinence: No Spinal tumors: No Cauda equina syndrome: No Compression fracture: No Abdominal aneurysm: No  COGNITION:  Overall cognitive status: Within functional limits for tasks assessed     SENSATION: WFL   POSTURE:  Sacral sitting, forward head, rounded shoulders.  PALPATION: Tenderness to palpation along lumbar paraspinals   LUMBAR ROM:   Active  A/PROM  07/12/22  Flexion 75% limited pain down leg  Extension 75%  stretches/tight  Right lateral flexion 75% pain in right leg  Left lateral flexion 50% limited no change  Right rotation   Left rotation    (Blank rows = not tested)     LE Measurements Lower Extremity Right 7/31 Left 7/31   A/PROM MMT A/PROM MMT  Hip Flexion  4  4  Hip Extension      Hip Abduction      Hip Adduction      Hip Internal rotation      Hip External rotation      Knee Flexion  4-  4  Knee Extension  4-  4  Ankle Dorsiflexion  4  4+  Ankle Plantarflexion      Ankle Inversion      Ankle Eversion       (Blank rows = not tested)  * pain    FUNCTIONAL TESTS:  STS - no UE no pain    TODAY'S TREATMENT  07/22/2022 Therapeutic Exercises:  prone lying 10 minutes - then PROM hip ext and knee flexion stretch 4 minutes B,  shoulder flexion up wall traction 2x15 10" holds, prone lying 5 minutes  Manual: PA to hip grade II/III, STM to glutes and lumbar paraspinals - focus on R side  Previous interventions Mod fig 4 stretch 30 sec x 2 on R ;  Trial for pelvic tilts, better movement/ability in sitting; Trial for supine march,  painful,  TA contraction 3 sec x 10, with education on achieving active contraction (increased radicular pain with bracing).    Prone:  Seated: Pelvic tilts x 15; sit to stand x 5 - with education on back mechanics Standing: education and practice for bend/squat x 5, pt with question about how to lift laundry basket    Self Care:     PATIENT EDUCATION:  Education details:. On HEP and  anatomy Person educated: Patient Education method: Explanation, Demonstration, and Handouts Education comprehension: verbalized understanding  HOME EXERCISE PROGRAM: MQDAGATE  ASSESSMENT:  CLINICAL IMPRESSION: 07/22/2022 Continued improvement in tolerance to prone and overall movements. Reduced pain noted end of session. Discussed continued difficulty with bending and squatting, will work on this next session to improve ability to pick items up off the floor. Will continue with current POC as tolerated.   Eval: Patient is a 81 y.o. male who was seen today for physical therapy evaluation and treatment for lumbar pain radiating into right leg. Most limitations occur first thing in the morning, and transitional movements after sitting for long period of time. Educated patient on posture and effect prolonged postures has on lumbar spine. Patient would greatly benefit from skilled PT to improve overall function and QOL.   OBJECTIVE IMPAIRMENTS decreased activity tolerance, decreased ROM, decreased strength, impaired perceived functional ability, impaired flexibility, improper body mechanics, postural dysfunction, and pain.   ACTIVITY LIMITATIONS community activity, occupation, yard work, and Office manager .   PERSONAL FACTORS Age, Fitness, and 1 comorbidity: transverse myelytis  are also affecting patient's functional outcome.    REHAB POTENTIAL: Good  CLINICAL DECISION MAKING: Stable/uncomplicated  EVALUATION COMPLEXITY: Low   GOALS: Goals reviewed with patient?  yes  SHORT TERM GOALS:  Patient will be independent in self management strategies to improve quality of life and functional outcomes. Baseline: new program Target date: 06/07/2022 Goal status: MET  2.  Patient will report at least 50% improvement in overall symptoms and/or function to demonstrate improved functional mobility Baseline: 0% Target date: 06/07/2022 Goal status: PROGRESSING  3.  Patient will be able to get up out of  chair without use of arms or reported stiffness afterwards Baseline: unable Target date: 06/07/2022 Goal status: MET     LONG TERM GOALS:  Patient will report at least 75% improvement in overall symptoms and/or function to demonstrate improved functional mobility Baseline: 0% Target date: 06/28/2022 Goal status: PROGRESSING  2.  Patient will be able to demonstrate lumbar ROM without radicular symptoms. Baseline: painful Target date: 06/28/2022 Goal status: PROGRESSING  3.  Patient will report returning to golf without difficulties secondary to pain in leg. Baseline: not playing golf Target date: 06/28/2022 Goal status: PROGRESSING     PLAN: PT FREQUENCY: 2x/week  PT DURATION: 6 weeks  PLANNED INTERVENTIONS: Therapeutic exercises, Therapeutic activity, Neuromuscular re-education, Balance training, Gait training, Patient/Family education, Joint mobilization, Dry Needling, Electrical stimulation, Spinal mobilization, Cryotherapy, Moist heat, Traction, Ionotophoresis 48m/ml Dexamethasone, and Manual therapy.  PLAN FOR NEXT SESSION: hip hinge movement to pick up things off the floor   4:42 PM, 07/22/22 MJerene Pitch DPT [facility]

## 2022-07-23 ENCOUNTER — Ambulatory Visit
Admission: RE | Admit: 2022-07-23 | Discharge: 2022-07-23 | Disposition: A | Discharge status: Home / Self Care | Payer: Medicare Other | Source: Ambulatory Visit | Attending: Family Medicine | Admitting: Family Medicine

## 2022-07-23 DIAGNOSIS — M5416 Radiculopathy, lumbar region: Secondary | ICD-10-CM

## 2022-07-23 DIAGNOSIS — M4727 Other spondylosis with radiculopathy, lumbosacral region: Secondary | ICD-10-CM | POA: Diagnosis not present

## 2022-07-23 MED ORDER — METHYLPREDNISOLONE ACETATE 40 MG/ML INJ SUSP (RADIOLOG
80.0000 mg | Freq: Once | INTRAMUSCULAR | Status: AC
  Administered 2022-07-23: 80 mg via EPIDURAL

## 2022-07-23 MED ORDER — IOPAMIDOL (ISOVUE-M 200) INJECTION 41%
1.0000 mL | Freq: Once | INTRAMUSCULAR | Status: AC
  Administered 2022-07-23: 1 mL via EPIDURAL

## 2022-07-23 NOTE — Discharge Instructions (Addendum)
Post Procedure Spinal Discharge Instruction Sheet  You may resume a regular diet and any medications that you routinely take (including pain medications) unless otherwise noted by MD.  No driving day of procedure.  Light activity throughout the rest of the day.  Do not do any strenuous work, exercise, bending or lifting.  The day following the procedure, you can resume normal physical activity but you should refrain from exercising or physical therapy for at least three days thereafter.  You may apply ice to the injection site, 20 minutes on, 20 minutes off, as needed. Do not apply ice directly to skin.    Common Side Effects:  Headaches- take your usual medications as directed by your physician.  Increase your fluid intake.  Caffeinated beverages may be helpful.  Lie flat in bed until your headache resolves.  Restlessness or inability to sleep- you may have trouble sleeping for the next few days.  Ask your referring physician if you need any medication for sleep.  Facial flushing or redness- should subside within a few days.  Increased pain- a temporary increase in pain a day or two following your procedure is not unusual.  Take your pain medication as prescribed by your referring physician.  Leg cramps  Please contact our office at [phone] for the following symptoms: Fever greater than 100 degrees. Headaches unresolved with medication after 2-3 days. Increased swelling, pain, or redness at injection site.   Thank you for visiting [facility] today.     You may resume Aspirin today.

## 2022-07-26 ENCOUNTER — Other Ambulatory Visit: Payer: Self-pay | Admitting: Ophthalmology

## 2022-07-26 DIAGNOSIS — H02831 Dermatochalasis of right upper eyelid: Secondary | ICD-10-CM | POA: Diagnosis not present

## 2022-07-26 DIAGNOSIS — H02535 Eyelid retraction left lower eyelid: Secondary | ICD-10-CM | POA: Diagnosis not present

## 2022-07-26 DIAGNOSIS — H02532 Eyelid retraction right lower eyelid: Secondary | ICD-10-CM | POA: Diagnosis not present

## 2022-07-26 DIAGNOSIS — H02413 Mechanical ptosis of bilateral eyelids: Secondary | ICD-10-CM | POA: Diagnosis not present

## 2022-07-26 DIAGNOSIS — L820 Inflamed seborrheic keratosis: Secondary | ICD-10-CM | POA: Diagnosis not present

## 2022-07-26 DIAGNOSIS — H57813 Brow ptosis, bilateral: Secondary | ICD-10-CM | POA: Diagnosis not present

## 2022-07-26 DIAGNOSIS — L821 Other seborrheic keratosis: Secondary | ICD-10-CM | POA: Diagnosis not present

## 2022-07-26 DIAGNOSIS — H02032 Senile entropion of right lower eyelid: Secondary | ICD-10-CM | POA: Diagnosis not present

## 2022-07-26 DIAGNOSIS — H02042 Spastic entropion of right lower eyelid: Secondary | ICD-10-CM | POA: Diagnosis not present

## 2022-07-26 DIAGNOSIS — H0289 Other specified disorders of eyelid: Secondary | ICD-10-CM | POA: Diagnosis not present

## 2022-07-26 DIAGNOSIS — H02423 Myogenic ptosis of bilateral eyelids: Secondary | ICD-10-CM | POA: Diagnosis not present

## 2022-07-26 DIAGNOSIS — D23112 Other benign neoplasm of skin of right lower eyelid, including canthus: Secondary | ICD-10-CM | POA: Diagnosis not present

## 2022-07-26 DIAGNOSIS — H02834 Dermatochalasis of left upper eyelid: Secondary | ICD-10-CM | POA: Diagnosis not present

## 2022-07-26 DIAGNOSIS — H02052 Trichiasis without entropian right lower eyelid: Secondary | ICD-10-CM | POA: Diagnosis not present

## 2022-08-03 ENCOUNTER — Encounter: Payer: Medicare Other | Admitting: Physical Therapy

## 2022-08-04 NOTE — Progress Notes (Unsigned)
   I, Peterson Lombard, LAT, ATC acting as a scribe for Alec Leader, MD.  Bronislaw Snyder is a 81 y.o. male who presents to [place] [facility] today for f/u lumbar radiculopathy. Pt was given a R hip posterior iliac crest just lateral to the SI joint steroid injection on 05/25/22. Pt had no relief from this injection and was referred for a facet injection that was performed on 06/02/22. Pt's last ESI was performed on 05/13/22. Pt had a visit with [facility] on 06/09/22. Pt was last seen by [name] on 06/24/22 and another ESI was ordered at a different location, L3, and later performed on 8/11. Today, pt reports  Dx imaging: 05/07/22 R hip and L-spine MRI             03/30/22 L-spine XR             07/20/17 T-spine MRI             07/22/16 T-spine MRI             06/03/16 T-spine MRI             03/08/09 L-spine MRI  Pertinent review of systems: ***  Relevant historical information: ***   Exam:  There were no vitals taken for this visit. General: Well Developed, well nourished, and in no acute distress.   MSK: ***    Lab and Radiology Results No results found for this or any previous visit (from the past 72 hour(s)). No results found.     Assessment and Plan: 81 y.o. male with ***   PDMP not reviewed this encounter. No orders of the defined types were placed in this encounter.  No orders of the defined types were placed in this encounter.    Discussed warning signs or symptoms. Please see discharge instructions. Patient expresses understanding.   ***

## 2022-08-05 ENCOUNTER — Ambulatory Visit (INDEPENDENT_AMBULATORY_CARE_PROVIDER_SITE_OTHER): Payer: Medicare Other | Admitting: Family Medicine

## 2022-08-05 VITALS — BP 124/70 | HR 70 | Ht 68.0 in | Wt 152.4 lb

## 2022-08-05 DIAGNOSIS — M5416 Radiculopathy, lumbar region: Secondary | ICD-10-CM

## 2022-08-05 NOTE — Patient Instructions (Signed)
Thank you for coming in today.   Ok to try return to golf.   Take it easy.   Let me know if you need anything.   Recheck as needed.

## 2022-08-20 DIAGNOSIS — M5416 Radiculopathy, lumbar region: Secondary | ICD-10-CM | POA: Diagnosis not present

## 2022-08-20 DIAGNOSIS — M5126 Other intervertebral disc displacement, lumbar region: Secondary | ICD-10-CM | POA: Diagnosis not present

## 2022-08-20 DIAGNOSIS — Z6825 Body mass index (BMI) 25.0-25.9, adult: Secondary | ICD-10-CM | POA: Diagnosis not present

## 2022-08-25 DIAGNOSIS — M4802 Spinal stenosis, cervical region: Secondary | ICD-10-CM | POA: Diagnosis not present

## 2022-08-25 DIAGNOSIS — G4709 Other insomnia: Secondary | ICD-10-CM | POA: Diagnosis not present

## 2022-08-25 DIAGNOSIS — G0491 Myelitis, unspecified: Secondary | ICD-10-CM | POA: Diagnosis not present

## 2022-08-25 DIAGNOSIS — M5431 Sciatica, right side: Secondary | ICD-10-CM | POA: Diagnosis not present

## 2022-08-25 DIAGNOSIS — M47812 Spondylosis without myelopathy or radiculopathy, cervical region: Secondary | ICD-10-CM | POA: Diagnosis not present

## 2022-08-25 DIAGNOSIS — G0489 Other myelitis: Secondary | ICD-10-CM | POA: Diagnosis not present

## 2022-08-25 DIAGNOSIS — G373 Acute transverse myelitis in demyelinating disease of central nervous system: Secondary | ICD-10-CM | POA: Diagnosis not present

## 2022-08-25 DIAGNOSIS — Z6824 Body mass index (BMI) 24.0-24.9, adult: Secondary | ICD-10-CM | POA: Diagnosis not present

## 2022-08-31 ENCOUNTER — Encounter: Payer: Self-pay | Admitting: Physical Therapy

## 2022-08-31 ENCOUNTER — Ambulatory Visit (INDEPENDENT_AMBULATORY_CARE_PROVIDER_SITE_OTHER): Payer: Medicare Other | Admitting: Physical Therapy

## 2022-08-31 DIAGNOSIS — M25551 Pain in right hip: Secondary | ICD-10-CM

## 2022-08-31 DIAGNOSIS — M6281 Muscle weakness (generalized): Secondary | ICD-10-CM | POA: Diagnosis not present

## 2022-08-31 DIAGNOSIS — M5459 Other low back pain: Secondary | ICD-10-CM

## 2022-08-31 DIAGNOSIS — E039 Hypothyroidism, unspecified: Secondary | ICD-10-CM | POA: Diagnosis not present

## 2022-08-31 NOTE — Therapy (Signed)
OUTPATIENT PHYSICAL THERAPY TREATMENT, RECERT       Patient Name: Alec Snyder MRN: [id number] DOB:01-06-1941, 81 y.o., male Today's Date: 08/31/2022   PT End of Session - 08/31/22 1556     Visit Number 21    Number of Visits 31    Date for PT Re-Evaluation 11/23/22    Authorization Type medicare    Progress Note Due on Visit 28    PT Start Time 1558    PT Stop Time 1642    PT Time Calculation (min) 44 min    Activity Tolerance Patient limited by pain    Behavior During Therapy [facility] for tasks assessed/performed                  Past Medical History:  Diagnosis Date   Arthritis    in hands   Cancer ([place])    melanoma/ on hand and leg   Hemorrhoids    occasional   Hx of adenomatous colonic polyps 06/30/2017   Hyperlipemia    Hypothyroid    Inguinal hernia    bilateral   Joint pain    minor   Transverse myelitis ([place])    Wears glasses    Past Surgical History:  Procedure Laterality Date   BLEPHAROPLASTY Bilateral 2016   COLONOSCOPY     HERNIA REPAIR  2012   BIH   MELANOMA EXCISION     Patient Active Problem List   Diagnosis Date Noted   Aortic valve disorder 03/30/2022   Hardening of the aorta (main artery of the heart) ([place]) 03/30/2022   Elevated PSA 03/30/2022   Localized, primary osteoarthritis of hand 03/30/2022   Transverse myelitis ([place]) 03/30/2022   Rheumatoid arthritis ([place]) 03/30/2022   Other specified health status 03/30/2022   Prediabetes 03/30/2022   Vitamin B12 deficiency (non anemic) 03/30/2022   Exertional dyspnea 04/10/2020   Essential hypertension 06/23/2019   Positive QuantiFERON-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 07/12/2017   Hx of adenomatous colonic polyps 06/30/2017   Hypothyroidism 06/06/2017   Hyperlipidemia 05/14/2014   Inguinal hernia bilateral, non-recurrent 06/14/2011    PCP: Deland Pretty, MD  REFERRING PROVIDER: Lynne Leader   REFERRING DIAG: [phone] (ICD-10-CM) - Chronic right-sided low back pain with right-sided  sciatica M25.551 (ICD-10-CM) - Right hip pain  THERAPY DIAG:  Other low back pain - Plan: PT plan of care cert/re-cert  Pain in right hip - Plan: PT plan of care cert/re-cert  Muscle weakness (generalized) - Plan: PT plan of care cert/re-cert  ONSET DATE: about one month for back pain  SUBJECTIVE:  SUBJECTIVE STATEMENT: 08/31/2022 Played 9 holes of golf lightly on Friday and paid for it over the weekend with increased pain in the right hip and leg and is a little bit better but still hurting.  EvaL: States that he has been having some symptoms in his leg and buttocks. States he saw the MD and he thinks it is a pinched nerve. States that he has felt a little bit better as he has taken the medication. States that the morning is the worse and as he walks it feels better. States that some positions. States that he was having back pain for about a month but it wasn't terrible and then on 03/29/22 he has severe pain in his leg.  States he wants to get back to golf. He notes he has been sitting more than usual  PERTINENT HISTORY:  RA and  transverse myelytis, hx of low back pain  PAIN:  Are you having pain? Yes: NPRS scale: at worse 6/10 Pain location: right buttocks and buttocks  Pain description: sharp  Aggravating factors: sitting down, getting up Relieving factors: medication    PRECAUTIONS: None  WEIGHT BEARING RESTRICTIONS No  FALLS:  Has patient fallen in last 6 months? No   OCCUPATION: investor - part time worker  PLOF: Independent  PATIENT GOALS ikes to play golf but currently unable to   OBJECTIVE:   DIAGNOSTIC FINDINGS:  Xray 03/31/22 IMPRESSION: No recent fracture is seen in the lumbar spine. Lumbar spondylosis with disc space narrowing, bony spurs and facet hypertrophy. There is  interval worsening of degenerative changes in the lumbar spine, particularly at L4-L5 level.   SCREENING FOR RED FLAGS: Bowel or bladder incontinence: No Spinal tumors: No Cauda equina syndrome: No Compression fracture: No Abdominal aneurysm: No  COGNITION:  Overall cognitive status: Within functional limits for tasks assessed     SENSATION: WFL   POSTURE:  Sacral sitting, forward head, rounded shoulders.  PALPATION: Tenderness to palpation along lumbar paraspinals   LUMBAR ROM:   Active  A/PROM  07/12/22  Flexion 75% limited pain down leg  Extension 75%  stretches/tight  Right lateral flexion 75% pain in right leg  Left lateral flexion 50% limited no change  Right rotation   Left rotation    (Blank rows = not tested)     LE Measurements Lower Extremity Right 7/31 Left 7/31   A/PROM MMT A/PROM MMT  Hip Flexion  4  4  Hip Extension      Hip Abduction      Hip Adduction      Hip Internal rotation      Hip External rotation      Knee Flexion  4-  4  Knee Extension  4-  4  Ankle Dorsiflexion  4  4+  Ankle Plantarflexion      Ankle Inversion      Ankle Eversion       (Blank rows = not tested)  * pain    FUNCTIONAL TESTS:  STS - no UE no pain    TODAY'S TREATMENT  08/31/2022 Therapeutic Exercises:  prone lying - not tolerated, right trunk rotation x20, bridges - painful, hip abd iso 10 minutes total, seated hip abd green band x30   Manual: lumbar traction, hip long axis traction R, STM to right glutes  Previous interventions Mod fig 4 stretch 30 sec x 2 on R ;  Trial for pelvic tilts, better movement/ability in sitting; Trial for supine march, painful,  TA contraction 3 sec  x 10, with education on achieving active contraction (increased radicular pain with bracing).    Prone:  Seated: Pelvic tilts x 15; sit to stand x 5 - with education on back mechanics Standing: education and practice for bend/squat x 5, pt with question about how to lift laundry  basket    Self Care:     PATIENT EDUCATION:  Education details:. On HEP and anatomy Person educated: Patient Education method: Explanation, Demonstration, and Handouts Education comprehension: verbalized understanding  HOME EXERCISE PROGRAM: MQDAGATE  ASSESSMENT:  CLINICAL IMPRESSION: 08/31/2022  Continues to benefit from decompressive work, patient has still not trailed pool. Recent flair up contributing to increase in pain, educated patient on pain management strategies, easing into golf/previous activities and plan moving forward. Minimal apts able to be made with last recert, extending certification period to continue to work on remaining goals and improve overall function. Patient limited in driving, walking and daily activities and would greatly benefit from skilled PT   Eval: Patient is a 81 y.o. male who was seen today for physical therapy evaluation and treatment for lumbar pain radiating into right leg. Most limitations occur first thing in the morning, and transitional movements after sitting for long period of time. Educated patient on posture and effect prolonged postures has on lumbar spine. Patient would greatly benefit from skilled PT to improve overall function and QOL.   OBJECTIVE IMPAIRMENTS decreased activity tolerance, decreased ROM, decreased strength, impaired perceived functional ability, impaired flexibility, improper body mechanics, postural dysfunction, and pain.   ACTIVITY LIMITATIONS community activity, occupation, yard work, and Office manager .   PERSONAL FACTORS Age, Fitness, and 1 comorbidity: transverse myelytis  are also affecting patient's functional outcome.    REHAB POTENTIAL: Good  CLINICAL DECISION MAKING: Stable/uncomplicated  EVALUATION COMPLEXITY: Low   GOALS: Goals reviewed with patient?  yes  SHORT TERM GOALS:  Patient will be independent in self management strategies to improve quality of life and functional outcomes. Baseline: new  program Target date: 06/07/2022 Goal status: MET  2.  Patient will report at least 50% improvement in overall symptoms and/or function to demonstrate improved functional mobility Baseline: 0% Target date: 06/07/2022 Goal status: PROGRESSING  3.  Patient will be able to get up out of chair without use of arms or reported stiffness afterwards Baseline: unable Target date: 06/07/2022 Goal status: MET     LONG TERM GOALS:  Patient will report at least 75% improvement in overall symptoms and/or function to demonstrate improved functional mobility Baseline: 0% Target date: 06/28/2022 Goal status: PROGRESSING  2.  Patient will be able to demonstrate lumbar ROM without radicular symptoms. Baseline: painful Target date: 06/28/2022 Goal status: PROGRESSING  3.  Patient will report returning to golf without difficulties secondary to pain in leg. Baseline: not playing golf Target date: 06/28/2022 Goal status: PROGRESSING     PLAN: PT FREQUENCY: 10 visits at 3-4K/AJGO over 12 week certification period  PT DURATION: 12 weeks  PLANNED INTERVENTIONS: Therapeutic exercises, Therapeutic activity, Neuromuscular re-education, Balance training, Gait training, Patient/Family education, Joint mobilization, Dry Needling, Electrical stimulation, Spinal mobilization, Cryotherapy, Moist heat, Traction, Ionotophoresis 69m/ml Dexamethasone, and Manual therapy.  PLAN FOR NEXT SESSION: hip hinge movement to pick up things off the floor   4:47 PM, 08/31/22 MJerene Pitch DPT [facility]

## 2022-09-02 ENCOUNTER — Ambulatory Visit (INDEPENDENT_AMBULATORY_CARE_PROVIDER_SITE_OTHER): Payer: Medicare Other | Admitting: Physical Therapy

## 2022-09-02 ENCOUNTER — Encounter: Payer: Self-pay | Admitting: Physical Therapy

## 2022-09-02 DIAGNOSIS — M25551 Pain in right hip: Secondary | ICD-10-CM | POA: Diagnosis not present

## 2022-09-02 DIAGNOSIS — M5459 Other low back pain: Secondary | ICD-10-CM | POA: Diagnosis not present

## 2022-09-02 DIAGNOSIS — M6281 Muscle weakness (generalized): Secondary | ICD-10-CM | POA: Diagnosis not present

## 2022-09-02 NOTE — Therapy (Signed)
OUTPATIENT PHYSICAL THERAPY TREATMENT, RECERT       Patient Name: Alec Snyder MRN: [id number] DOB:1941/08/13, 81 y.o., male Today's Date: 09/02/2022   PT End of Session - 09/02/22 1515     Visit Number 22    Number of Visits 31    Date for PT Re-Evaluation 11/23/22    Authorization Type medicare    Progress Note Due on Visit 28    PT Start Time 4628    PT Stop Time 1556    PT Time Calculation (min) 40 min    Activity Tolerance Patient limited by pain    Behavior During Therapy [facility] for tasks assessed/performed                  Past Medical History:  Diagnosis Date   Arthritis    in hands   Cancer [facility])    melanoma/ on hand and leg   Hemorrhoids    occasional   Hx of adenomatous colonic polyps 06/30/2017   Hyperlipemia    Hypothyroid    Inguinal hernia    bilateral   Joint pain    minor   Transverse myelitis ([place])    Wears glasses    Past Surgical History:  Procedure Laterality Date   BLEPHAROPLASTY Bilateral 2016   COLONOSCOPY     HERNIA REPAIR  2012   BIH   MELANOMA EXCISION     Patient Active Problem List   Diagnosis Date Noted   Aortic valve disorder 03/30/2022   Hardening of the aorta (main artery of the heart) ([place]) 03/30/2022   Elevated PSA 03/30/2022   Localized, primary osteoarthritis of hand 03/30/2022   Transverse myelitis ([place]) 03/30/2022   Rheumatoid arthritis ([place]) 03/30/2022   Other specified health status 03/30/2022   Prediabetes 03/30/2022   Vitamin B12 deficiency (non anemic) 03/30/2022   Exertional dyspnea 04/10/2020   Essential hypertension 06/23/2019   Positive QuantiFERON-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 07/12/2017   Hx of adenomatous colonic polyps 06/30/2017   Hypothyroidism 06/06/2017   Hyperlipidemia 05/14/2014   Inguinal hernia bilateral, non-recurrent 06/14/2011    PCP: Deland Pretty, MD  REFERRING PROVIDER: Lynne Leader   REFERRING DIAG: [phone] (ICD-10-CM) - Chronic right-sided low back pain with right-sided  sciatica M25.551 (ICD-10-CM) - Right hip pain  THERAPY DIAG:  Other low back pain  Pain in right hip  Muscle weakness (generalized)  ONSET DATE: about one month for back pain  SUBJECTIVE:  SUBJECTIVE STATEMENT: 09/02/2022 States he is worse, pain is all the time and can't get comfortable  EvaL: States that he has been having some symptoms in his leg and buttocks. States he saw the MD and he thinks it is a pinched nerve. States that he has felt a little bit better as he has taken the medication. States that the morning is the worse and as he walks it feels better. States that some positions. States that he was having back pain for about a month but it wasn't terrible and then on 03/29/22 he has severe pain in his leg.  States he wants to get back to golf. He notes he has been sitting more than usual  PERTINENT HISTORY:  RA and  transverse myelytis, hx of low back pain  PAIN:  Are you having pain? Yes: NPRS scale: at worse 7/10 Pain location: right buttocks and buttocks  Pain description: sharp  Aggravating factors: sitting down, getting up Relieving factors: medication    PRECAUTIONS: None  WEIGHT BEARING RESTRICTIONS No  FALLS:  Has patient fallen in last 6 months? No   OCCUPATION: investor - part time worker  PLOF: Independent  PATIENT GOALS ikes to play golf but currently unable to   OBJECTIVE:   DIAGNOSTIC FINDINGS:  Xray 03/31/22 IMPRESSION: No recent fracture is seen in the lumbar spine. Lumbar spondylosis with disc space narrowing, bony spurs and facet hypertrophy. There is interval worsening of degenerative changes in the lumbar spine, particularly at L4-L5 level.   SCREENING FOR RED FLAGS: Bowel or bladder incontinence: No Spinal tumors: No Cauda equina syndrome:  No Compression fracture: No Abdominal aneurysm: No  COGNITION:  Overall cognitive status: Within functional limits for tasks assessed     SENSATION: WFL   POSTURE:  Sacral sitting, forward head, rounded shoulders.  PALPATION: Tenderness to palpation along lumbar paraspinals   LUMBAR ROM:   Active  A/PROM  07/12/22  Flexion 75% limited pain down leg  Extension 75%  stretches/tight  Right lateral flexion 75% pain in right leg  Left lateral flexion 50% limited no change  Right rotation   Left rotation    (Blank rows = not tested)     LE Measurements Lower Extremity Right 7/31 Left 7/31   A/PROM MMT A/PROM MMT  Hip Flexion  4  4  Hip Extension      Hip Abduction      Hip Adduction      Hip Internal rotation      Hip External rotation      Knee Flexion  4-  4  Knee Extension  4-  4  Ankle Dorsiflexion  4  4+  Ankle Plantarflexion      Ankle Inversion      Ankle Eversion       (Blank rows = not tested)  * pain    FUNCTIONAL TESTS:  STS - no UE no pain Chiropractor     TODAY'S TREATMENT  09/02/2022 Therapeutic Exercises:  SIJ stretch - right down and left up 5 minutes - tolerated mildly well, glute iso in different positions 5" holds x10 each position   Manual: lumbar traction, STM to right glutes/hip, percussion to right PSIS and surrounding region  Previous interventions Mod fig 4 stretch 30 sec x 2 on R ;  Trial for pelvic tilts, better movement/ability in sitting; Trial for supine march, painful,  TA contraction 3 sec x 10, with education on achieving active contraction (increased radicular pain with bracing).  Prone:  Seated: Pelvic tilts x 15; sit to stand x 5 - with education on back mechanics Standing: education and practice for bend/squat x 5, pt with question about how to lift laundry basket    Self Care:     PATIENT EDUCATION:  Education details:. On HEP, on seated position with elevated chair height - felt better Person educated:  Patient Education method: Explanation, Demonstration, and Handouts Education comprehension: verbalized understanding  HOME EXERCISE PROGRAM: MQDAGATE  ASSESSMENT:  CLINICAL IMPRESSION: 09/02/2022 Patient with continued pain, improved with elevating chair while seated. Continues to benefit from traction and percussion. Reinforced  trying pool exercises as he has access to a pool. Answered questions about chriopractor and how traction may be helpful. Will continue with current POC as tolerated.   Eval: Patient is a 81 y.o. male who was seen today for physical therapy evaluation and treatment for lumbar pain radiating into right leg. Most limitations occur first thing in the morning, and transitional movements after sitting for long period of time. Educated patient on posture and effect prolonged postures has on lumbar spine. Patient would greatly benefit from skilled PT to improve overall function and QOL.   OBJECTIVE IMPAIRMENTS decreased activity tolerance, decreased ROM, decreased strength, impaired perceived functional ability, impaired flexibility, improper body mechanics, postural dysfunction, and pain.   ACTIVITY LIMITATIONS community activity, occupation, yard work, and Office manager .   PERSONAL FACTORS Age, Fitness, and 1 comorbidity: transverse myelytis  are also affecting patient's functional outcome.    REHAB POTENTIAL: Good  CLINICAL DECISION MAKING: Stable/uncomplicated  EVALUATION COMPLEXITY: Low   GOALS: Goals reviewed with patient?  yes  SHORT TERM GOALS:  Patient will be independent in self management strategies to improve quality of life and functional outcomes. Baseline: new program Target date: 06/07/2022 Goal status: MET  2.  Patient will report at least 50% improvement in overall symptoms and/or function to demonstrate improved functional mobility Baseline: 0% Target date: 06/07/2022 Goal status: PROGRESSING  3.  Patient will be able to get up out of chair  without use of arms or reported stiffness afterwards Baseline: unable Target date: 06/07/2022 Goal status: MET     LONG TERM GOALS:  Patient will report at least 75% improvement in overall symptoms and/or function to demonstrate improved functional mobility Baseline: 0% Target date: 06/28/2022 Goal status: PROGRESSING  2.  Patient will be able to demonstrate lumbar ROM without radicular symptoms. Baseline: painful Target date: 06/28/2022 Goal status: PROGRESSING  3.  Patient will report returning to golf without difficulties secondary to pain in leg. Baseline: not playing golf Target date: 06/28/2022 Goal status: PROGRESSING     PLAN: PT FREQUENCY: 10 visits at 0-5W/PVXY over 12 week certification period  PT DURATION: 12 weeks  PLANNED INTERVENTIONS: Therapeutic exercises, Therapeutic activity, Neuromuscular re-education, Balance training, Gait training, Patient/Family education, Joint mobilization, Dry Needling, Electrical stimulation, Spinal mobilization, Cryotherapy, Moist heat, Traction, Ionotophoresis 34m/ml Dexamethasone, and Manual therapy.  PLAN FOR NEXT SESSION: hip hinge movement to pick up things off the floor   3:59 PM, 09/02/22 MJerene Pitch DPT [facility]

## 2022-09-08 ENCOUNTER — Ambulatory Visit (INDEPENDENT_AMBULATORY_CARE_PROVIDER_SITE_OTHER): Payer: Medicare Other | Admitting: Physical Therapy

## 2022-09-08 ENCOUNTER — Encounter: Payer: Self-pay | Admitting: Physical Therapy

## 2022-09-08 DIAGNOSIS — M6281 Muscle weakness (generalized): Secondary | ICD-10-CM | POA: Diagnosis not present

## 2022-09-08 DIAGNOSIS — M5459 Other low back pain: Secondary | ICD-10-CM

## 2022-09-08 DIAGNOSIS — M25551 Pain in right hip: Secondary | ICD-10-CM

## 2022-09-08 NOTE — Therapy (Addendum)
OUTPATIENT PHYSICAL THERAPY TREATMENT, RECERT    PHYSICAL THERAPY DISCHARGE SUMMARY  Visits from Start of Care: 23  Current functional level related to goals / functional outcomes: Unable to assess due to unplanned discharge    Remaining deficits: Unable to assess due to unplanned discharge    Education / Equipment: Unable to assess due to unplanned discharge    Patient agrees to discharge. Patient goals were not met. Patient is being discharged due to not returning since the last visit.  2:28 PM, 02/10/23 Jerene Pitch, DPT Physical Therapy with [facility]     Patient Name: Alec Snyder MRN: [id number] DOB:May 03, 1941, 81 y.o., male Today's Date: 09/08/2022   PT End of Session - 09/08/22 1602     Visit Number 23    Number of Visits 31    Date for PT Re-Evaluation 11/23/22    Authorization Type medicare    Progress Note Due on Visit 28    PT Start Time 1603    PT Stop Time [id number]    PT Time Calculation (min) 42 min    Activity Tolerance Patient limited by pain    Behavior During Therapy [facility] for tasks assessed/performed                  Past Medical History:  Diagnosis Date   Arthritis    in hands   Cancer ([place])    melanoma/ on hand and leg   Hemorrhoids    occasional   Hx of adenomatous colonic polyps 06/30/2017   Hyperlipemia    Hypothyroid    Inguinal hernia    bilateral   Joint pain    minor   Transverse myelitis ([place])    Wears glasses    Past Surgical History:  Procedure Laterality Date   BLEPHAROPLASTY Bilateral 2016   COLONOSCOPY     HERNIA REPAIR  2012   BIH   MELANOMA EXCISION     Patient Active Problem List   Diagnosis Date Noted   Aortic valve disorder 03/30/2022   Hardening of the aorta (main artery of the heart) ([place]) 03/30/2022   Elevated PSA 03/30/2022   Localized, primary osteoarthritis of hand 03/30/2022   Transverse myelitis ([place]) 03/30/2022   Rheumatoid arthritis ([place]) 03/30/2022   Other specified health status  03/30/2022   Prediabetes 03/30/2022   Vitamin B12 deficiency (non anemic) 03/30/2022   Exertional dyspnea 04/10/2020   Essential hypertension 06/23/2019   Positive QuantiFERON-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 07/12/2017   Hx of adenomatous colonic polyps 06/30/2017   Hypothyroidism 06/06/2017   Hyperlipidemia 05/14/2014   Inguinal hernia bilateral, non-recurrent 06/14/2011    PCP: Deland Pretty, MD  REFERRING PROVIDER: Lynne Leader   REFERRING DIAG: [phone] (ICD-10-CM) - Chronic right-sided low back pain with right-sided sciatica M25.551 (ICD-10-CM) - Right hip pain  THERAPY DIAG:  Other low back pain  Pain in right hip  Muscle weakness (generalized)  ONSET DATE: about one month for back pain  SUBJECTIVE:  SUBJECTIVE STATEMENT: 09/08/2022 States he is worse, pain is all the time and can't get comfortable  EvaL: States that he has been having some symptoms in his leg and buttocks. States he saw the MD and he thinks it is a pinched nerve. States that he has felt a little bit better as he has taken the medication. States that the morning is the worse and as he walks it feels better. States that some positions. States that he was having back pain for about a month but it wasn't terrible and then on 03/29/22 he has severe pain in his leg.  States he wants to get back to golf. He notes he has been sitting more than usual  PERTINENT HISTORY:  RA and  transverse myelytis, hx of low back pain  PAIN:  Are you having pain? Yes: NPRS scale: at worse 7/10 Pain location: right buttocks and buttocks  Pain description: sharp  Aggravating factors: sitting down, getting up Relieving factors: medication    PRECAUTIONS: None  WEIGHT BEARING RESTRICTIONS No  FALLS:  Has patient fallen in last 6 months? No   OCCUPATION:  investor - part time worker  PLOF: Independent  PATIENT GOALS ikes to play golf but currently unable to   OBJECTIVE:   DIAGNOSTIC FINDINGS:  Xray 03/31/22 IMPRESSION: No recent fracture is seen in the lumbar spine. Lumbar spondylosis with disc space narrowing, bony spurs and facet hypertrophy. There is interval worsening of degenerative changes in the lumbar spine, particularly at L4-L5 level.   SCREENING FOR RED FLAGS: Bowel or bladder incontinence: No Spinal tumors: No Cauda equina syndrome: No Compression fracture: No Abdominal aneurysm: No  COGNITION:  Overall cognitive status: Within functional limits for tasks assessed     SENSATION: WFL   POSTURE:  Sacral sitting, forward head, rounded shoulders.  PALPATION: Tenderness to palpation along lumbar paraspinals   LUMBAR ROM:   Active  A/PROM  07/12/22  Flexion 75% limited pain down leg  Extension 75%  stretches/tight  Right lateral flexion 75% pain in right leg  Left lateral flexion 50% limited no change  Right rotation   Left rotation    (Blank rows = not tested)     LE Measurements Lower Extremity Right 7/31 Left 7/31   A/PROM MMT A/PROM MMT  Hip Flexion  4  4  Hip Extension      Hip Abduction      Hip Adduction      Hip Internal rotation      Hip External rotation      Knee Flexion  4-  4  Knee Extension  4-  4  Ankle Dorsiflexion  4  4+  Ankle Plantarflexion      Ankle Inversion      Ankle Eversion       (Blank rows = not tested)  * pain    FUNCTIONAL TESTS:  STS - no UE no pain Chiropractor     TODAY'S TREATMENT  09/08/2022 Therapeutic Exercises:     Manual: lumbar traction, 15 minutes TE: self traction 15 minutes with strap  Previous interventions Mod fig 4 stretch 30 sec x 2 on R ;  Trial for pelvic tilts, better movement/ability in sitting; Trial for supine march, painful,  TA contraction 3 sec x 10, with education on achieving active contraction (increased radicular pain  with bracing).    Prone:  Seated: Pelvic tilts x 15; sit to stand x 5 - with education on back mechanics Standing: education and practice for bend/squat  x 5, pt with question about how to lift laundry basket    Self Care:     PATIENT EDUCATION:  Education details:. On HEP, on nerve ablation, on traction, on differences between chiro and PT, on trailing PT Person educated: Patient Education method: Explanation, Demonstration, and Handouts Education comprehension: verbalized understanding  HOME EXERCISE PROGRAM: MQDAGATE  ASSESSMENT:  CLINICAL IMPRESSION: 09/08/2022 Session focused on answering questions patient had about current presentation and options moving forward. Stressed trying Scientist, research (life sciences). Improved symptoms after traction in clinic but no manual machine in this clinic. Educated patient on seated postures and positions. Will continue with current POC as tolerated  Eval: Patient is a 81 y.o. male who was seen today for physical therapy evaluation and treatment for lumbar pain radiating into right leg. Most limitations occur first thing in the morning, and transitional movements after sitting for long period of time. Educated patient on posture and effect prolonged postures has on lumbar spine. Patient would greatly benefit from skilled PT to improve overall function and QOL.   OBJECTIVE IMPAIRMENTS decreased activity tolerance, decreased ROM, decreased strength, impaired perceived functional ability, impaired flexibility, improper body mechanics, postural dysfunction, and pain.   ACTIVITY LIMITATIONS community activity, occupation, yard work, and Office manager .   PERSONAL FACTORS Age, Fitness, and 1 comorbidity: transverse myelytis  are also affecting patient's functional outcome.    REHAB POTENTIAL: Good  CLINICAL DECISION MAKING: Stable/uncomplicated  EVALUATION COMPLEXITY: Low   GOALS: Goals reviewed with patient?  yes  SHORT TERM GOALS:  Patient will be independent in  self management strategies to improve quality of life and functional outcomes. Baseline: new program Target date: 06/07/2022 Goal status: MET  2.  Patient will report at least 50% improvement in overall symptoms and/or function to demonstrate improved functional mobility Baseline: 0% Target date: 06/07/2022 Goal status: PROGRESSING  3.  Patient will be able to get up out of chair without use of arms or reported stiffness afterwards Baseline: unable Target date: 06/07/2022 Goal status: MET     LONG TERM GOALS:  Patient will report at least 75% improvement in overall symptoms and/or function to demonstrate improved functional mobility Baseline: 0% Target date: 06/28/2022 Goal status: PROGRESSING  2.  Patient will be able to demonstrate lumbar ROM without radicular symptoms. Baseline: painful Target date: 06/28/2022 Goal status: PROGRESSING  3.  Patient will report returning to golf without difficulties secondary to pain in leg. Baseline: not playing golf Target date: 06/28/2022 Goal status: PROGRESSING     PLAN: PT FREQUENCY: 10 visits at 0000000 over 12 week certification period  PT DURATION: 12 weeks  PLANNED INTERVENTIONS: Therapeutic exercises, Therapeutic activity, Neuromuscular re-education, Balance training, Gait training, Patient/Family education, Joint mobilization, Dry Needling, Electrical stimulation, Spinal mobilization, Cryotherapy, Moist heat, Traction, Ionotophoresis '4mg'$ /ml Dexamethasone, and Manual therapy.  PLAN FOR NEXT SESSION: hip hinge movement to pick up things off the floor   4:50 PM, 09/08/22 Jerene Pitch, DPT Physical Therapy with [facility]

## 2022-09-13 ENCOUNTER — Other Ambulatory Visit: Payer: Self-pay | Admitting: Orthopedic Surgery

## 2022-09-13 DIAGNOSIS — M5416 Radiculopathy, lumbar region: Secondary | ICD-10-CM

## 2022-09-14 ENCOUNTER — Encounter: Payer: Medicare Other | Admitting: Physical Therapy

## 2022-09-14 ENCOUNTER — Ambulatory Visit
Admission: RE | Admit: 2022-09-14 | Discharge: 2022-09-14 | Disposition: A | Discharge status: Home / Self Care | Payer: Medicare Other | Source: Ambulatory Visit | Attending: Orthopedic Surgery | Admitting: Orthopedic Surgery

## 2022-09-14 DIAGNOSIS — M545 Low back pain, unspecified: Secondary | ICD-10-CM | POA: Diagnosis not present

## 2022-09-14 DIAGNOSIS — M5416 Radiculopathy, lumbar region: Secondary | ICD-10-CM

## 2022-09-14 DIAGNOSIS — M48061 Spinal stenosis, lumbar region without neurogenic claudication: Secondary | ICD-10-CM | POA: Diagnosis not present

## 2022-09-15 ENCOUNTER — Telehealth: Payer: Self-pay | Admitting: Family Medicine

## 2022-09-15 NOTE — Telephone Encounter (Signed)
Patient called asking if Dr Georgina Snell could call him.  He said that he will be having emergency surgery and would like to discuss it with Dr Georgina Snell.

## 2022-09-15 NOTE — Telephone Encounter (Signed)
I spoke with Ollen Gross. He wanted to keep me posted.  He is having surgery on the 9th

## 2022-09-16 ENCOUNTER — Other Ambulatory Visit: Payer: Medicare Other

## 2022-09-16 ENCOUNTER — Encounter: Payer: Medicare Other | Admitting: Physical Therapy

## 2022-09-16 DIAGNOSIS — M5126 Other intervertebral disc displacement, lumbar region: Secondary | ICD-10-CM | POA: Diagnosis not present

## 2022-09-16 DIAGNOSIS — R791 Abnormal coagulation profile: Secondary | ICD-10-CM | POA: Diagnosis not present

## 2022-09-16 DIAGNOSIS — M5116 Intervertebral disc disorders with radiculopathy, lumbar region: Secondary | ICD-10-CM | POA: Diagnosis not present

## 2022-09-20 ENCOUNTER — Encounter: Payer: Medicare Other | Admitting: Physical Therapy

## 2022-09-20 DIAGNOSIS — E039 Hypothyroidism, unspecified: Secondary | ICD-10-CM | POA: Diagnosis not present

## 2022-09-20 DIAGNOSIS — G8929 Other chronic pain: Secondary | ICD-10-CM | POA: Diagnosis not present

## 2022-09-20 DIAGNOSIS — Z9889 Other specified postprocedural states: Secondary | ICD-10-CM | POA: Diagnosis not present

## 2022-09-20 DIAGNOSIS — M48061 Spinal stenosis, lumbar region without neurogenic claudication: Secondary | ICD-10-CM | POA: Diagnosis not present

## 2022-09-20 DIAGNOSIS — R7302 Impaired glucose tolerance (oral): Secondary | ICD-10-CM | POA: Diagnosis not present

## 2022-09-20 DIAGNOSIS — K219 Gastro-esophageal reflux disease without esophagitis: Secondary | ICD-10-CM | POA: Diagnosis not present

## 2022-09-20 DIAGNOSIS — G373 Acute transverse myelitis in demyelinating disease of central nervous system: Secondary | ICD-10-CM | POA: Diagnosis not present

## 2022-09-20 DIAGNOSIS — E871 Hypo-osmolality and hyponatremia: Secondary | ICD-10-CM | POA: Diagnosis not present

## 2022-09-20 DIAGNOSIS — Z85820 Personal history of malignant melanoma of skin: Secondary | ICD-10-CM | POA: Diagnosis not present

## 2022-09-20 DIAGNOSIS — I2510 Atherosclerotic heart disease of native coronary artery without angina pectoris: Secondary | ICD-10-CM | POA: Diagnosis not present

## 2022-09-20 DIAGNOSIS — Z7982 Long term (current) use of aspirin: Secondary | ICD-10-CM | POA: Diagnosis not present

## 2022-09-20 DIAGNOSIS — E7800 Pure hypercholesterolemia, unspecified: Secondary | ICD-10-CM | POA: Diagnosis not present

## 2022-09-20 DIAGNOSIS — I10 Essential (primary) hypertension: Secondary | ICD-10-CM | POA: Diagnosis not present

## 2022-09-20 DIAGNOSIS — Z79899 Other long term (current) drug therapy: Secondary | ICD-10-CM | POA: Diagnosis not present

## 2022-09-20 DIAGNOSIS — M1990 Unspecified osteoarthritis, unspecified site: Secondary | ICD-10-CM | POA: Diagnosis not present

## 2022-09-20 DIAGNOSIS — I129 Hypertensive chronic kidney disease with stage 1 through stage 4 chronic kidney disease, or unspecified chronic kidney disease: Secondary | ICD-10-CM | POA: Diagnosis not present

## 2022-09-20 DIAGNOSIS — M5116 Intervertebral disc disorders with radiculopathy, lumbar region: Secondary | ICD-10-CM | POA: Diagnosis not present

## 2022-09-20 DIAGNOSIS — E785 Hyperlipidemia, unspecified: Secondary | ICD-10-CM | POA: Diagnosis not present

## 2022-09-20 DIAGNOSIS — Z87891 Personal history of nicotine dependence: Secondary | ICD-10-CM | POA: Diagnosis not present

## 2022-09-20 DIAGNOSIS — M069 Rheumatoid arthritis, unspecified: Secondary | ICD-10-CM | POA: Diagnosis not present

## 2022-09-20 DIAGNOSIS — Z79890 Hormone replacement therapy (postmenopausal): Secondary | ICD-10-CM | POA: Diagnosis not present

## 2022-09-20 DIAGNOSIS — N183 Chronic kidney disease, stage 3 unspecified: Secondary | ICD-10-CM | POA: Diagnosis not present

## 2022-09-21 DIAGNOSIS — E871 Hypo-osmolality and hyponatremia: Secondary | ICD-10-CM | POA: Diagnosis not present

## 2022-09-21 DIAGNOSIS — I129 Hypertensive chronic kidney disease with stage 1 through stage 4 chronic kidney disease, or unspecified chronic kidney disease: Secondary | ICD-10-CM | POA: Diagnosis not present

## 2022-09-21 DIAGNOSIS — M5116 Intervertebral disc disorders with radiculopathy, lumbar region: Secondary | ICD-10-CM | POA: Diagnosis not present

## 2022-09-21 DIAGNOSIS — I10 Essential (primary) hypertension: Secondary | ICD-10-CM | POA: Diagnosis not present

## 2022-09-21 DIAGNOSIS — E785 Hyperlipidemia, unspecified: Secondary | ICD-10-CM | POA: Diagnosis not present

## 2022-09-21 DIAGNOSIS — M48061 Spinal stenosis, lumbar region without neurogenic claudication: Secondary | ICD-10-CM | POA: Diagnosis not present

## 2022-09-21 DIAGNOSIS — M069 Rheumatoid arthritis, unspecified: Secondary | ICD-10-CM | POA: Diagnosis not present

## 2022-09-21 DIAGNOSIS — E039 Hypothyroidism, unspecified: Secondary | ICD-10-CM | POA: Diagnosis not present

## 2022-09-21 DIAGNOSIS — N183 Chronic kidney disease, stage 3 unspecified: Secondary | ICD-10-CM | POA: Diagnosis not present

## 2022-09-21 DIAGNOSIS — G373 Acute transverse myelitis in demyelinating disease of central nervous system: Secondary | ICD-10-CM | POA: Diagnosis not present

## 2022-09-21 DIAGNOSIS — G8929 Other chronic pain: Secondary | ICD-10-CM | POA: Diagnosis not present

## 2022-09-21 DIAGNOSIS — K219 Gastro-esophageal reflux disease without esophagitis: Secondary | ICD-10-CM | POA: Diagnosis not present

## 2022-09-23 ENCOUNTER — Encounter: Payer: Medicare Other | Admitting: Physical Therapy

## 2022-09-29 ENCOUNTER — Ambulatory Visit: Payer: Medicare Other | Admitting: Cardiovascular Disease

## 2022-10-01 DIAGNOSIS — I129 Hypertensive chronic kidney disease with stage 1 through stage 4 chronic kidney disease, or unspecified chronic kidney disease: Secondary | ICD-10-CM | POA: Diagnosis not present

## 2022-10-01 DIAGNOSIS — E039 Hypothyroidism, unspecified: Secondary | ICD-10-CM | POA: Diagnosis not present

## 2022-10-01 DIAGNOSIS — M1990 Unspecified osteoarthritis, unspecified site: Secondary | ICD-10-CM | POA: Diagnosis not present

## 2022-10-01 DIAGNOSIS — I2510 Atherosclerotic heart disease of native coronary artery without angina pectoris: Secondary | ICD-10-CM | POA: Diagnosis not present

## 2022-10-01 DIAGNOSIS — M4802 Spinal stenosis, cervical region: Secondary | ICD-10-CM | POA: Diagnosis not present

## 2022-10-01 DIAGNOSIS — E7800 Pure hypercholesterolemia, unspecified: Secondary | ICD-10-CM | POA: Diagnosis not present

## 2022-10-01 DIAGNOSIS — Z87891 Personal history of nicotine dependence: Secondary | ICD-10-CM | POA: Diagnosis not present

## 2022-10-01 DIAGNOSIS — K219 Gastro-esophageal reflux disease without esophagitis: Secondary | ICD-10-CM | POA: Diagnosis not present

## 2022-10-01 DIAGNOSIS — Z7982 Long term (current) use of aspirin: Secondary | ICD-10-CM | POA: Diagnosis not present

## 2022-10-01 DIAGNOSIS — M47816 Spondylosis without myelopathy or radiculopathy, lumbar region: Secondary | ICD-10-CM | POA: Diagnosis not present

## 2022-10-01 DIAGNOSIS — Z79890 Hormone replacement therapy (postmenopausal): Secondary | ICD-10-CM | POA: Diagnosis not present

## 2022-10-01 DIAGNOSIS — Z79899 Other long term (current) drug therapy: Secondary | ICD-10-CM | POA: Diagnosis not present

## 2022-10-01 DIAGNOSIS — N183 Chronic kidney disease, stage 3 unspecified: Secondary | ICD-10-CM | POA: Diagnosis not present

## 2022-10-01 DIAGNOSIS — M5126 Other intervertebral disc displacement, lumbar region: Secondary | ICD-10-CM | POA: Diagnosis not present

## 2022-10-01 DIAGNOSIS — G0489 Other myelitis: Secondary | ICD-10-CM | POA: Diagnosis not present

## 2022-10-13 DIAGNOSIS — M0609 Rheumatoid arthritis without rheumatoid factor, multiple sites: Secondary | ICD-10-CM | POA: Diagnosis not present

## 2022-10-13 DIAGNOSIS — M7989 Other specified soft tissue disorders: Secondary | ICD-10-CM | POA: Diagnosis not present

## 2022-10-13 DIAGNOSIS — Z23 Encounter for immunization: Secondary | ICD-10-CM | POA: Diagnosis not present

## 2022-10-13 DIAGNOSIS — R768 Other specified abnormal immunological findings in serum: Secondary | ICD-10-CM | POA: Diagnosis not present

## 2022-10-13 DIAGNOSIS — Z79899 Other long term (current) drug therapy: Secondary | ICD-10-CM | POA: Diagnosis not present

## 2022-10-13 DIAGNOSIS — M1990 Unspecified osteoarthritis, unspecified site: Secondary | ICD-10-CM | POA: Diagnosis not present

## 2022-10-20 ENCOUNTER — Ambulatory Visit: Payer: Medicare Other | Attending: Cardiovascular Disease | Admitting: Cardiovascular Disease

## 2022-10-20 ENCOUNTER — Encounter: Payer: Self-pay | Admitting: Cardiovascular Disease

## 2022-10-20 VITALS — BP 113/68 | HR 71 | Ht 67.0 in | Wt 158.8 lb

## 2022-10-20 DIAGNOSIS — R931 Abnormal findings on diagnostic imaging of heart and coronary circulation: Secondary | ICD-10-CM | POA: Diagnosis not present

## 2022-10-20 DIAGNOSIS — E782 Mixed hyperlipidemia: Secondary | ICD-10-CM | POA: Diagnosis not present

## 2022-10-20 DIAGNOSIS — I10 Essential (primary) hypertension: Secondary | ICD-10-CM | POA: Diagnosis not present

## 2022-10-20 NOTE — Patient Instructions (Signed)
Medication Instructions:  The current medical regimen is effective;  continue present plan and medications.  *If you need a refill on your cardiac medications before your next appointment, please call your pharmacy*   Follow-Up: At [facility], you and your health needs are our priority.  As part of our continuing mission to provide you with exceptional heart care, we have created designated Provider Care Teams.  These Care Teams include your primary Cardiologist (physician) and Advanced Practice Providers (APPs -  Physician Assistants and Nurse Practitioners) who all work together to provide you with the care you need, when you need it.  We recommend signing up for the patient portal called "MyChart".  Sign up information is provided on this After Visit Summary.  MyChart is used to connect with patients for Virtual Visits (Telemedicine).  Patients are able to view lab/test results, encounter notes, upcoming appointments, etc.  Non-urgent messages can be sent to your provider as well.   To learn more about what you can do with MyChart, go to [email or web address].    Your next appointment:   12 month(s)  The format for your next appointment:   In Person  Provider:   Quay Burow, MD

## 2022-10-20 NOTE — Assessment & Plan Note (Signed)
History of hyperlipidemia on rosuvastatin with lipid profile performed 07/13/2021 revealing total cholesterol 109, LDL 37 and HDL 40.

## 2022-10-20 NOTE — Progress Notes (Signed)
10/20/2022 Jarvin Ogren   [birth year]  [id number]  Primary Physician Deland Pretty, MD Primary Cardiologist: Lorretta Harp MD Lupe Carney, [place]  HPI:  Alec Snyder is a 81 y.o.   married Caucasian male father of 2 children, and father of 4 grandchildren referred through the courtesy of Dr. Deland Pretty for cardiovascular evaluation.  I last saw him in the office 07/22/2020.  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  His cardiac risk factor profile is only remarkable for mild medically treated hyperlipidemia. He did does not smoke nor is he diabetic. There is no family history. He has never had a heart or stroke. He denies chest pain or shortness of breath. He does exercise fairly frequently on the treadmill.   He was referred back to me by [name] for newly recognized dyspnea on exertion.  He denies chest pain.  He did have a chest CT performed 06/25/2016 that showed dense coronary calcification and aortic calcification as well.  [name] recently performed a 2D echo with results are unavailable to me at this time.   He had a coronary CTA performed on 05/06/2020 that did show three-vessel disease.  Subsequent Myoview stress test showed no ischemia and echo revealed normal LV function.  He essentially has no symptoms denies chest pain or shortness of breath.   Since I saw him 2 years ago he has had cataract surgery and now can see better.  He has had blepharoplasty the as well.  He recently underwent neurosurgery for herniated disc about a month ago and can now walk without pain.  He denies chest pain or shortness of breath.  Current Meds  Medication Sig   aspirin EC 81 MG tablet Take 81 mg by mouth daily. Swallow whole.   Cholecalciferol (VITAMIN D3 PO) Take by mouth. Patient takes 1 tablet on sun and weds.   Cyanocobalamin (VITAMIN B-12) 1000 MCG SUBL  Place under the tongue daily.    hydrochlorothiazide (MICROZIDE) 12.5 MG capsule Take 12.5 mg by mouth daily.   leflunomide (ARAVA) 20 MG tablet Take 20 mg by mouth daily.   olmesartan (BENICAR) 40 MG tablet Take 40 mg by mouth daily.   rosuvastatin (CRESTOR) 20 MG tablet Take 20 mg by mouth daily.   sildenafil (VIAGRA) 100 MG tablet Take 100 mg by mouth daily as needed for erectile dysfunction.   traZODone (DESYREL) 50 MG tablet 1 & 1/2 TABLETS AT BEDTIME ONCE A DAY ORALLY 90 DAYS   Current Facility-Administered Medications for the 10/20/22 encounter (Office Visit) with Lorretta Harp, MD  Medication   0.9 %  sodium chloride infusion     No Known Allergies  Social History   Socioeconomic History   Marital status: Married    Spouse name: [name]   Number of children: 2   Years of education: 16   Highest education level: Not on file  Occupational History    Comment: retired, Counselling psychologist transport  Tobacco Use   Smoking status: Former    Packs/day: 1.50    Years: 3.00    Total pack years: 4.50    Types: Cigarettes    Quit date: 05/14/1974    Years since quitting: 48.4   Smokeless tobacco: Never   Tobacco comments:    occasional cigar.  Substance and Sexual Activity   Alcohol use: Yes  Alcohol/week: 2.0 standard drinks of alcohol    Types: 2 Standard drinks or equivalent per week    Comment: occas 2-4 beers  weekly   Drug use: No   Sexual activity: Not on file  Other Topics Concern   Not on file  Social History Narrative   Lives with spouse   Caffeine use- coffee  3-4 cups daily   Social Determinants of Health   Financial Resource Strain: Not on file  Food Insecurity: Not on file  Transportation Needs: Not on file  Physical Activity: Not on file  Stress: Not on file  Social Connections: Not on file  Intimate Partner Violence: Not on file     Review of Systems: General: negative for chills, fever, night sweats or weight changes.  Cardiovascular: negative for chest  pain, dyspnea on exertion, edema, orthopnea, palpitations, paroxysmal nocturnal dyspnea or shortness of breath Dermatological: negative for rash Respiratory: negative for cough or wheezing Urologic: negative for hematuria Abdominal: negative for nausea, vomiting, diarrhea, bright red blood per rectum, melena, or hematemesis Neurologic: negative for visual changes, syncope, or dizziness All other systems reviewed and are otherwise negative except as noted above.    Blood pressure 113/68, pulse 71, height '5\' 7"'$  (1.702 m), weight 158 lb 12.8 oz (72 kg), SpO2 97 %.  General appearance: alert and no distress Neck: no adenopathy, no carotid bruit, no JVD, supple, symmetrical, trachea midline, and thyroid not enlarged, symmetric, no tenderness/mass/nodules Lungs: clear to auscultation bilaterally Heart: regular rate and rhythm, S1, S2 normal, no murmur, click, rub or gallop Extremities: extremities normal, atraumatic, no cyanosis or edema Pulses: 2+ and symmetric Skin: Skin color, texture, turgor normal. No rashes or lesions Neurologic: Grossly normal  EKG sinus rhythm at 71 without ST or T wave changes.  I personally reviewed this EKG.  ASSESSMENT AND PLAN:   Hyperlipidemia History of hyperlipidemia on rosuvastatin with lipid profile performed 07/13/2021 revealing total cholesterol 109, LDL 37 and HDL 40.  Essential hypertension History of essential hypertension a blood pressure measured today at 113/68.  He is on hydrochlorothiazide, and Benicar.  Elevated coronary artery calcium score Coronary calcium score/CTA performed 05/06/2020 revealed a coronary calcium score of 1345 with multivessel CAD.  A subsequent Myoview stress test performed 05/20/2020 was completely normal.  He is active and is completely asymptomatic.  Medical therapy was recommended.     Lorretta Harp MD FACP,FACC,FAHA, [facility] 10/20/2022 9:08 AM

## 2022-10-20 NOTE — Assessment & Plan Note (Signed)
History of essential hypertension a blood pressure measured today at 113/68.  He is on hydrochlorothiazide, and Benicar.

## 2022-10-20 NOTE — Assessment & Plan Note (Signed)
Coronary calcium score/CTA performed 05/06/2020 revealed a coronary calcium score of 1345 with multivessel CAD.  A subsequent Myoview stress test performed 05/20/2020 was completely normal.  He is active and is completely asymptomatic.  Medical therapy was recommended.

## 2022-10-28 DIAGNOSIS — D485 Neoplasm of uncertain behavior of skin: Secondary | ICD-10-CM | POA: Diagnosis not present

## 2022-10-28 DIAGNOSIS — Z85828 Personal history of other malignant neoplasm of skin: Secondary | ICD-10-CM | POA: Diagnosis not present

## 2022-10-28 DIAGNOSIS — C44722 Squamous cell carcinoma of skin of right lower limb, including hip: Secondary | ICD-10-CM | POA: Diagnosis not present

## 2022-11-09 DIAGNOSIS — H02831 Dermatochalasis of right upper eyelid: Secondary | ICD-10-CM | POA: Diagnosis not present

## 2022-11-09 DIAGNOSIS — H02834 Dermatochalasis of left upper eyelid: Secondary | ICD-10-CM | POA: Diagnosis not present

## 2022-11-09 DIAGNOSIS — H57813 Brow ptosis, bilateral: Secondary | ICD-10-CM | POA: Diagnosis not present

## 2022-11-09 DIAGNOSIS — L821 Other seborrheic keratosis: Secondary | ICD-10-CM | POA: Diagnosis not present

## 2022-11-09 DIAGNOSIS — L570 Actinic keratosis: Secondary | ICD-10-CM | POA: Diagnosis not present

## 2022-11-09 DIAGNOSIS — H02413 Mechanical ptosis of bilateral eyelids: Secondary | ICD-10-CM | POA: Diagnosis not present

## 2022-11-26 DIAGNOSIS — Z961 Presence of intraocular lens: Secondary | ICD-10-CM | POA: Diagnosis not present

## 2022-11-26 DIAGNOSIS — H53002 Unspecified amblyopia, left eye: Secondary | ICD-10-CM | POA: Diagnosis not present

## 2022-11-26 DIAGNOSIS — H2512 Age-related nuclear cataract, left eye: Secondary | ICD-10-CM | POA: Diagnosis not present

## 2022-11-26 DIAGNOSIS — H5211 Myopia, right eye: Secondary | ICD-10-CM | POA: Diagnosis not present

## 2022-11-30 DIAGNOSIS — L821 Other seborrheic keratosis: Secondary | ICD-10-CM | POA: Diagnosis not present

## 2022-11-30 DIAGNOSIS — L814 Other melanin hyperpigmentation: Secondary | ICD-10-CM | POA: Diagnosis not present

## 2022-11-30 DIAGNOSIS — Z85828 Personal history of other malignant neoplasm of skin: Secondary | ICD-10-CM | POA: Diagnosis not present

## 2022-11-30 DIAGNOSIS — D1801 Hemangioma of skin and subcutaneous tissue: Secondary | ICD-10-CM | POA: Diagnosis not present

## 2022-11-30 DIAGNOSIS — L570 Actinic keratosis: Secondary | ICD-10-CM | POA: Diagnosis not present

## 2022-11-30 DIAGNOSIS — L905 Scar conditions and fibrosis of skin: Secondary | ICD-10-CM | POA: Diagnosis not present

## 2022-11-30 DIAGNOSIS — D2271 Melanocytic nevi of right lower limb, including hip: Secondary | ICD-10-CM | POA: Diagnosis not present

## 2023-01-12 DIAGNOSIS — M0609 Rheumatoid arthritis without rheumatoid factor, multiple sites: Secondary | ICD-10-CM | POA: Diagnosis not present

## 2023-02-18 DIAGNOSIS — M5416 Radiculopathy, lumbar region: Secondary | ICD-10-CM | POA: Diagnosis not present

## 2023-02-18 DIAGNOSIS — M48061 Spinal stenosis, lumbar region without neurogenic claudication: Secondary | ICD-10-CM | POA: Diagnosis not present

## 2023-02-22 DIAGNOSIS — M8589 Other specified disorders of bone density and structure, multiple sites: Secondary | ICD-10-CM | POA: Diagnosis not present

## 2023-02-23 DIAGNOSIS — R351 Nocturia: Secondary | ICD-10-CM | POA: Diagnosis not present

## 2023-02-23 DIAGNOSIS — L309 Dermatitis, unspecified: Secondary | ICD-10-CM | POA: Diagnosis not present

## 2023-02-23 DIAGNOSIS — M8580 Other specified disorders of bone density and structure, unspecified site: Secondary | ICD-10-CM | POA: Diagnosis not present

## 2023-04-14 DIAGNOSIS — M8580 Other specified disorders of bone density and structure, unspecified site: Secondary | ICD-10-CM | POA: Diagnosis not present

## 2023-04-14 DIAGNOSIS — M1990 Unspecified osteoarthritis, unspecified site: Secondary | ICD-10-CM | POA: Diagnosis not present

## 2023-04-14 DIAGNOSIS — Z79899 Other long term (current) drug therapy: Secondary | ICD-10-CM | POA: Diagnosis not present

## 2023-04-14 DIAGNOSIS — M0609 Rheumatoid arthritis without rheumatoid factor, multiple sites: Secondary | ICD-10-CM | POA: Diagnosis not present

## 2023-04-14 DIAGNOSIS — M7989 Other specified soft tissue disorders: Secondary | ICD-10-CM | POA: Diagnosis not present

## 2023-04-14 DIAGNOSIS — R768 Other specified abnormal immunological findings in serum: Secondary | ICD-10-CM | POA: Diagnosis not present

## 2023-07-21 DIAGNOSIS — E039 Hypothyroidism, unspecified: Secondary | ICD-10-CM | POA: Diagnosis not present

## 2023-07-21 DIAGNOSIS — I10 Essential (primary) hypertension: Secondary | ICD-10-CM | POA: Diagnosis not present

## 2023-07-21 DIAGNOSIS — R7303 Prediabetes: Secondary | ICD-10-CM | POA: Diagnosis not present

## 2023-07-26 DIAGNOSIS — Z0000 Encounter for general adult medical examination without abnormal findings: Secondary | ICD-10-CM | POA: Diagnosis not present

## 2023-07-26 DIAGNOSIS — I10 Essential (primary) hypertension: Secondary | ICD-10-CM | POA: Diagnosis not present

## 2023-07-26 DIAGNOSIS — I2510 Atherosclerotic heart disease of native coronary artery without angina pectoris: Secondary | ICD-10-CM | POA: Diagnosis not present

## 2023-07-26 DIAGNOSIS — G373 Acute transverse myelitis in demyelinating disease of central nervous system: Secondary | ICD-10-CM | POA: Diagnosis not present

## 2023-07-26 DIAGNOSIS — E559 Vitamin D deficiency, unspecified: Secondary | ICD-10-CM | POA: Diagnosis not present

## 2023-07-26 DIAGNOSIS — D649 Anemia, unspecified: Secondary | ICD-10-CM | POA: Diagnosis not present

## 2023-07-26 DIAGNOSIS — N529 Male erectile dysfunction, unspecified: Secondary | ICD-10-CM | POA: Diagnosis not present

## 2023-07-26 DIAGNOSIS — R7303 Prediabetes: Secondary | ICD-10-CM | POA: Diagnosis not present

## 2023-07-26 DIAGNOSIS — K219 Gastro-esophageal reflux disease without esophagitis: Secondary | ICD-10-CM | POA: Diagnosis not present

## 2023-07-26 DIAGNOSIS — N1831 Chronic kidney disease, stage 3a: Secondary | ICD-10-CM | POA: Diagnosis not present

## 2023-07-26 DIAGNOSIS — E039 Hypothyroidism, unspecified: Secondary | ICD-10-CM | POA: Diagnosis not present

## 2023-07-26 DIAGNOSIS — E538 Deficiency of other specified B group vitamins: Secondary | ICD-10-CM | POA: Diagnosis not present

## 2023-08-22 DIAGNOSIS — R0989 Other specified symptoms and signs involving the circulatory and respiratory systems: Secondary | ICD-10-CM | POA: Diagnosis not present

## 2023-08-22 DIAGNOSIS — N1831 Chronic kidney disease, stage 3a: Secondary | ICD-10-CM | POA: Diagnosis not present

## 2023-08-22 DIAGNOSIS — I10 Essential (primary) hypertension: Secondary | ICD-10-CM | POA: Diagnosis not present

## 2023-08-22 DIAGNOSIS — D649 Anemia, unspecified: Secondary | ICD-10-CM | POA: Diagnosis not present

## 2023-09-13 DIAGNOSIS — I10 Essential (primary) hypertension: Secondary | ICD-10-CM | POA: Diagnosis not present

## 2023-09-13 DIAGNOSIS — R0989 Other specified symptoms and signs involving the circulatory and respiratory systems: Secondary | ICD-10-CM | POA: Diagnosis not present

## 2023-09-21 DIAGNOSIS — G373 Acute transverse myelitis in demyelinating disease of central nervous system: Secondary | ICD-10-CM | POA: Diagnosis not present

## 2023-09-21 DIAGNOSIS — G4709 Other insomnia: Secondary | ICD-10-CM | POA: Diagnosis not present

## 2023-09-21 DIAGNOSIS — Z23 Encounter for immunization: Secondary | ICD-10-CM | POA: Diagnosis not present

## 2023-10-17 DIAGNOSIS — M8580 Other specified disorders of bone density and structure, unspecified site: Secondary | ICD-10-CM | POA: Diagnosis not present

## 2023-10-17 DIAGNOSIS — M1990 Unspecified osteoarthritis, unspecified site: Secondary | ICD-10-CM | POA: Diagnosis not present

## 2023-10-17 DIAGNOSIS — R768 Other specified abnormal immunological findings in serum: Secondary | ICD-10-CM | POA: Diagnosis not present

## 2023-10-17 DIAGNOSIS — M0609 Rheumatoid arthritis without rheumatoid factor, multiple sites: Secondary | ICD-10-CM | POA: Diagnosis not present

## 2023-10-17 DIAGNOSIS — Z79899 Other long term (current) drug therapy: Secondary | ICD-10-CM | POA: Diagnosis not present

## 2023-11-07 DIAGNOSIS — Z85828 Personal history of other malignant neoplasm of skin: Secondary | ICD-10-CM | POA: Diagnosis not present

## 2023-11-07 DIAGNOSIS — L570 Actinic keratosis: Secondary | ICD-10-CM | POA: Diagnosis not present

## 2023-11-08 DIAGNOSIS — I2510 Atherosclerotic heart disease of native coronary artery without angina pectoris: Secondary | ICD-10-CM | POA: Diagnosis not present

## 2023-11-08 DIAGNOSIS — I10 Essential (primary) hypertension: Secondary | ICD-10-CM | POA: Diagnosis not present

## 2023-11-08 DIAGNOSIS — E782 Mixed hyperlipidemia: Secondary | ICD-10-CM | POA: Diagnosis not present

## 2023-12-01 DIAGNOSIS — D1801 Hemangioma of skin and subcutaneous tissue: Secondary | ICD-10-CM | POA: Diagnosis not present

## 2023-12-01 DIAGNOSIS — D2271 Melanocytic nevi of right lower limb, including hip: Secondary | ICD-10-CM | POA: Diagnosis not present

## 2023-12-01 DIAGNOSIS — L905 Scar conditions and fibrosis of skin: Secondary | ICD-10-CM | POA: Diagnosis not present

## 2023-12-01 DIAGNOSIS — L570 Actinic keratosis: Secondary | ICD-10-CM | POA: Diagnosis not present

## 2023-12-01 DIAGNOSIS — L821 Other seborrheic keratosis: Secondary | ICD-10-CM | POA: Diagnosis not present

## 2023-12-01 DIAGNOSIS — L814 Other melanin hyperpigmentation: Secondary | ICD-10-CM | POA: Diagnosis not present

## 2023-12-01 DIAGNOSIS — D692 Other nonthrombocytopenic purpura: Secondary | ICD-10-CM | POA: Diagnosis not present

## 2023-12-01 DIAGNOSIS — L720 Epidermal cyst: Secondary | ICD-10-CM | POA: Diagnosis not present

## 2023-12-01 DIAGNOSIS — Z85828 Personal history of other malignant neoplasm of skin: Secondary | ICD-10-CM | POA: Diagnosis not present

## 2023-12-01 DIAGNOSIS — D225 Melanocytic nevi of trunk: Secondary | ICD-10-CM | POA: Diagnosis not present

## 2023-12-01 DIAGNOSIS — D2272 Melanocytic nevi of left lower limb, including hip: Secondary | ICD-10-CM | POA: Diagnosis not present

## 2024-01-17 DIAGNOSIS — Z79899 Other long term (current) drug therapy: Secondary | ICD-10-CM | POA: Diagnosis not present

## 2024-02-01 DIAGNOSIS — N1831 Chronic kidney disease, stage 3a: Secondary | ICD-10-CM | POA: Diagnosis not present

## 2024-02-01 DIAGNOSIS — S90229A Contusion of unspecified lesser toe(s) with damage to nail, initial encounter: Secondary | ICD-10-CM | POA: Diagnosis not present

## 2024-02-01 DIAGNOSIS — B351 Tinea unguium: Secondary | ICD-10-CM | POA: Diagnosis not present

## 2024-03-28 DIAGNOSIS — G373 Acute transverse myelitis in demyelinating disease of central nervous system: Secondary | ICD-10-CM | POA: Diagnosis not present

## 2024-04-04 ENCOUNTER — Ambulatory Visit: Admitting: Family Medicine

## 2024-04-04 ENCOUNTER — Other Ambulatory Visit: Payer: Self-pay

## 2024-04-04 ENCOUNTER — Ambulatory Visit (INDEPENDENT_AMBULATORY_CARE_PROVIDER_SITE_OTHER)

## 2024-04-04 VITALS — BP 157/78 | HR 55 | Ht 67.0 in | Wt 169.2 lb

## 2024-04-04 DIAGNOSIS — M25562 Pain in left knee: Secondary | ICD-10-CM

## 2024-04-04 DIAGNOSIS — G8929 Other chronic pain: Secondary | ICD-10-CM | POA: Diagnosis not present

## 2024-04-04 DIAGNOSIS — M25561 Pain in right knee: Secondary | ICD-10-CM

## 2024-04-04 NOTE — Patient Instructions (Addendum)
 Thank you for coming in today.   Use a compression sleeve  Check back in 2 weeks

## 2024-04-04 NOTE — Progress Notes (Unsigned)
   Joanna Muck, PhD, LAT, ATC acting as a scribe for Alec Juniper, MD.  Alec Snyder is a 83 y.o. male who presents to [facility] at [facility] today for knee pain. Pt was last seen by Dr. Alease Hunter on 08/05/22 for lumbar radiculopathy.  Today, pt c/o bilat knee pain, R>L, after suffering a fall on the 17th. He landed on both knees and L hand. The pain has improved in his L knee, but the R is still pretty painful. He also notes bruising along the thenar eminence of his L hand, but notes the pain has resolved.   Knee swelling: yes- R knee, has improved some Mechanical symptoms: Aggravates: Treatments tried:  Pertinent review of systems: ***  Relevant historical information: ***   Exam:  There were no vitals taken for this visit. General: Well Developed, well nourished, and in no acute distress.   MSK: ***    Lab and Radiology Results No results found for this or any previous visit (from the past 72 hours). No results found.     Assessment and Plan: 83 y.o. male with ***   PDMP not reviewed this encounter. No orders of the defined types were placed in this encounter.  No orders of the defined types were placed in this encounter.    Discussed warning signs or symptoms. Please see discharge instructions. Patient expresses understanding.   ***

## 2024-04-16 NOTE — Progress Notes (Signed)
Right knee x-ray shows a little bit of arthritis.

## 2024-04-16 NOTE — Progress Notes (Signed)
 Left knee x-ray shows just a little bit of arthritis.

## 2024-04-17 DIAGNOSIS — M0609 Rheumatoid arthritis without rheumatoid factor, multiple sites: Secondary | ICD-10-CM | POA: Diagnosis not present

## 2024-04-17 DIAGNOSIS — M8580 Other specified disorders of bone density and structure, unspecified site: Secondary | ICD-10-CM | POA: Diagnosis not present

## 2024-04-17 DIAGNOSIS — D509 Iron deficiency anemia, unspecified: Secondary | ICD-10-CM | POA: Diagnosis not present

## 2024-04-17 DIAGNOSIS — M1990 Unspecified osteoarthritis, unspecified site: Secondary | ICD-10-CM | POA: Diagnosis not present

## 2024-04-17 DIAGNOSIS — R768 Other specified abnormal immunological findings in serum: Secondary | ICD-10-CM | POA: Diagnosis not present

## 2024-04-17 DIAGNOSIS — Z79899 Other long term (current) drug therapy: Secondary | ICD-10-CM | POA: Diagnosis not present

## 2024-04-18 ENCOUNTER — Ambulatory Visit: Admitting: Family Medicine

## 2024-04-18 ENCOUNTER — Encounter: Payer: Self-pay | Admitting: Family Medicine

## 2024-04-18 VITALS — BP 120/82 | HR 63 | Ht 67.0 in | Wt 170.0 lb

## 2024-04-18 DIAGNOSIS — M25562 Pain in left knee: Secondary | ICD-10-CM

## 2024-04-18 DIAGNOSIS — G8929 Other chronic pain: Secondary | ICD-10-CM

## 2024-04-18 DIAGNOSIS — M25561 Pain in right knee: Secondary | ICD-10-CM

## 2024-04-18 NOTE — Progress Notes (Unsigned)
   I, Miquel Amen, CMA acting as a scribe for Garlan Juniper, MD.  Alec Snyder is a 83 y.o. male who presents to [facility] at [facility] today for 2-wk f/u bilat knee pain. Pt was last seen by Dr. Alease Hunter on 04/04/24 and was advised to use a compression sleeve.  Today, pt reports gradual improvement of knee sx. Scabs are healing well. Using Aloe and polysporin. Still has palpable mass at medial aspect of the left knee. Notes wearing compression x 3 days then d/c d/t sx improvement.   Dx imaging: 04/04/24 R & L knee XR  Pertinent review of systems: No fevers or chills  Relevant historical information: History of transverse myelitis and rheumatoid arthritis.   Exam:  BP 120/82   Pulse 63   Ht 5\' 7"  (1.702 m)   Wt 170 lb (77.1 kg)   SpO2 98%   BMI 26.63 kg/m  General: Well Developed, well nourished, and in no acute distress.   MSK: Right knee healing abrasion.  Some effusion or seroma is still present at the superior medial knee area.  Nontender to palpation normal motion and intact strength.      Assessment and Plan: 83 y.o. male with anterior knee pain and swelling.  Improving with time and compression.  He still has a bit of an effusion or seroma left.  Continue compression and if not improved in about a month recheck and consider aspiration.   PDMP not reviewed this encounter. No orders of the defined types were placed in this encounter.  No orders of the defined types were placed in this encounter.    Discussed warning signs or symptoms. Please see discharge instructions. Patient expresses understanding.   The above documentation has been reviewed and is accurate and complete Garlan Juniper, M.D.

## 2024-04-18 NOTE — Patient Instructions (Signed)
 Thank you for coming in today.   Continue compression.   If not better in about 1 month I can drain it.   Let me know.

## 2024-05-23 ENCOUNTER — Ambulatory Visit: Admitting: Family Medicine

## 2024-05-23 NOTE — Progress Notes (Deleted)
   Joanna Muck, PhD, LAT, ATC acting as a scribe for Garlan Juniper, MD.  Alec Snyder is a 83 y.o. male who presents to [facility] at [facility] today for 64-month f/u bilat knee pain. Pt was last seen by Dr. Alease Hunter on 04/18/24 and was advised to cont compression.   Today, pt reports ***  Dx imaging: 04/04/24 R & L knee XR  Pertinent review of systems: ***  Relevant historical information: ***   Exam:  There were no vitals taken for this visit. General: Well Developed, well nourished, and in no acute distress.   MSK: ***    Lab and Radiology Results No results found for this or any previous visit (from the past 72 hours). No results found.     Assessment and Plan: 83 y.o. male with ***   PDMP not reviewed this encounter. No orders of the defined types were placed in this encounter.  No orders of the defined types were placed in this encounter.    Discussed warning signs or symptoms. Please see discharge instructions. Patient expresses understanding.   ***

## 2024-06-05 DIAGNOSIS — H2512 Age-related nuclear cataract, left eye: Secondary | ICD-10-CM | POA: Diagnosis not present

## 2024-06-05 DIAGNOSIS — H524 Presbyopia: Secondary | ICD-10-CM | POA: Diagnosis not present

## 2024-06-05 DIAGNOSIS — Z961 Presence of intraocular lens: Secondary | ICD-10-CM | POA: Diagnosis not present

## 2024-07-23 DIAGNOSIS — I10 Essential (primary) hypertension: Secondary | ICD-10-CM | POA: Diagnosis not present

## 2024-07-23 DIAGNOSIS — E039 Hypothyroidism, unspecified: Secondary | ICD-10-CM | POA: Diagnosis not present

## 2024-07-23 DIAGNOSIS — R7303 Prediabetes: Secondary | ICD-10-CM | POA: Diagnosis not present

## 2024-07-27 DIAGNOSIS — I10 Essential (primary) hypertension: Secondary | ICD-10-CM | POA: Diagnosis not present

## 2024-07-27 DIAGNOSIS — N1831 Chronic kidney disease, stage 3a: Secondary | ICD-10-CM | POA: Diagnosis not present

## 2024-07-27 DIAGNOSIS — K219 Gastro-esophageal reflux disease without esophagitis: Secondary | ICD-10-CM | POA: Diagnosis not present

## 2024-07-27 DIAGNOSIS — E538 Deficiency of other specified B group vitamins: Secondary | ICD-10-CM | POA: Diagnosis not present

## 2024-07-27 DIAGNOSIS — I700 Atherosclerosis of aorta: Secondary | ICD-10-CM | POA: Diagnosis not present

## 2024-07-27 DIAGNOSIS — I2510 Atherosclerotic heart disease of native coronary artery without angina pectoris: Secondary | ICD-10-CM | POA: Diagnosis not present

## 2024-07-27 DIAGNOSIS — E039 Hypothyroidism, unspecified: Secondary | ICD-10-CM | POA: Diagnosis not present

## 2024-07-27 DIAGNOSIS — L309 Dermatitis, unspecified: Secondary | ICD-10-CM | POA: Diagnosis not present

## 2024-07-27 DIAGNOSIS — G373 Acute transverse myelitis in demyelinating disease of central nervous system: Secondary | ICD-10-CM | POA: Diagnosis not present

## 2024-07-27 DIAGNOSIS — R7303 Prediabetes: Secondary | ICD-10-CM | POA: Diagnosis not present

## 2024-07-27 DIAGNOSIS — Z0000 Encounter for general adult medical examination without abnormal findings: Secondary | ICD-10-CM | POA: Diagnosis not present

## 2024-07-27 DIAGNOSIS — D84821 Immunodeficiency due to drugs: Secondary | ICD-10-CM | POA: Diagnosis not present

## 2024-07-30 DIAGNOSIS — I10 Essential (primary) hypertension: Secondary | ICD-10-CM | POA: Diagnosis not present

## 2024-07-30 DIAGNOSIS — D649 Anemia, unspecified: Secondary | ICD-10-CM | POA: Diagnosis not present

## 2024-08-23 DIAGNOSIS — N1831 Chronic kidney disease, stage 3a: Secondary | ICD-10-CM | POA: Diagnosis not present

## 2024-08-23 DIAGNOSIS — R7303 Prediabetes: Secondary | ICD-10-CM | POA: Diagnosis not present

## 2024-08-23 DIAGNOSIS — E782 Mixed hyperlipidemia: Secondary | ICD-10-CM | POA: Diagnosis not present

## 2024-08-23 DIAGNOSIS — I10 Essential (primary) hypertension: Secondary | ICD-10-CM | POA: Diagnosis not present

## 2024-10-17 DIAGNOSIS — G629 Polyneuropathy, unspecified: Secondary | ICD-10-CM | POA: Diagnosis not present

## 2024-10-17 DIAGNOSIS — Z23 Encounter for immunization: Secondary | ICD-10-CM | POA: Diagnosis not present

## 2024-10-17 DIAGNOSIS — G4709 Other insomnia: Secondary | ICD-10-CM | POA: Diagnosis not present

## 2024-10-17 DIAGNOSIS — E559 Vitamin D deficiency, unspecified: Secondary | ICD-10-CM | POA: Diagnosis not present

## 2024-10-18 DIAGNOSIS — M8580 Other specified disorders of bone density and structure, unspecified site: Secondary | ICD-10-CM | POA: Diagnosis not present

## 2024-10-18 DIAGNOSIS — M549 Dorsalgia, unspecified: Secondary | ICD-10-CM | POA: Diagnosis not present

## 2024-10-18 DIAGNOSIS — Z79899 Other long term (current) drug therapy: Secondary | ICD-10-CM | POA: Diagnosis not present

## 2024-10-18 DIAGNOSIS — M1990 Unspecified osteoarthritis, unspecified site: Secondary | ICD-10-CM | POA: Diagnosis not present

## 2024-10-18 DIAGNOSIS — M0609 Rheumatoid arthritis without rheumatoid factor, multiple sites: Secondary | ICD-10-CM | POA: Diagnosis not present

## 2024-10-18 DIAGNOSIS — R7689 Other specified abnormal immunological findings in serum: Secondary | ICD-10-CM | POA: Diagnosis not present

## 2024-11-05 DIAGNOSIS — M47816 Spondylosis without myelopathy or radiculopathy, lumbar region: Secondary | ICD-10-CM | POA: Diagnosis not present

## 2024-11-05 DIAGNOSIS — M62838 Other muscle spasm: Secondary | ICD-10-CM | POA: Diagnosis not present

## 2024-11-05 DIAGNOSIS — M5126 Other intervertebral disc displacement, lumbar region: Secondary | ICD-10-CM | POA: Diagnosis not present

## 2024-11-05 DIAGNOSIS — M1990 Unspecified osteoarthritis, unspecified site: Secondary | ICD-10-CM | POA: Diagnosis not present

## 2024-11-05 DIAGNOSIS — E559 Vitamin D deficiency, unspecified: Secondary | ICD-10-CM | POA: Diagnosis not present

## 2024-11-05 DIAGNOSIS — G629 Polyneuropathy, unspecified: Secondary | ICD-10-CM | POA: Diagnosis not present

## 2024-11-05 DIAGNOSIS — M48061 Spinal stenosis, lumbar region without neurogenic claudication: Secondary | ICD-10-CM | POA: Diagnosis not present

## 2024-11-05 DIAGNOSIS — Z5181 Encounter for therapeutic drug level monitoring: Secondary | ICD-10-CM | POA: Diagnosis not present

## 2024-11-05 DIAGNOSIS — G959 Disease of spinal cord, unspecified: Secondary | ICD-10-CM | POA: Diagnosis not present

## 2024-11-05 DIAGNOSIS — G373 Acute transverse myelitis in demyelinating disease of central nervous system: Secondary | ICD-10-CM | POA: Diagnosis not present

## 2024-11-05 DIAGNOSIS — M4716 Other spondylosis with myelopathy, lumbar region: Secondary | ICD-10-CM | POA: Diagnosis not present
